# Patient Record
Sex: Male | Born: 1955 | Race: Black or African American | Hispanic: No | Marital: Single | Smoking: Former smoker
Health system: Southern US, Community
[De-identification: ages and names within clinical notes are randomized; demographics above are authoritative.]

## PROBLEM LIST (undated history)

## (undated) DIAGNOSIS — K7460 Unspecified cirrhosis of liver: Secondary | ICD-10-CM

## (undated) DIAGNOSIS — G8929 Other chronic pain: Secondary | ICD-10-CM

## (undated) DIAGNOSIS — B1920 Unspecified viral hepatitis C without hepatic coma: Secondary | ICD-10-CM

## (undated) DIAGNOSIS — Z973 Presence of spectacles and contact lenses: Secondary | ICD-10-CM

## (undated) DIAGNOSIS — E785 Hyperlipidemia, unspecified: Secondary | ICD-10-CM

## (undated) DIAGNOSIS — I2510 Atherosclerotic heart disease of native coronary artery without angina pectoris: Secondary | ICD-10-CM

## (undated) DIAGNOSIS — F1910 Other psychoactive substance abuse, uncomplicated: Secondary | ICD-10-CM

## (undated) DIAGNOSIS — I10 Essential (primary) hypertension: Secondary | ICD-10-CM

## (undated) DIAGNOSIS — Z789 Other specified health status: Secondary | ICD-10-CM

## (undated) DIAGNOSIS — I252 Old myocardial infarction: Secondary | ICD-10-CM

## (undated) DIAGNOSIS — Z720 Tobacco use: Secondary | ICD-10-CM

## (undated) DIAGNOSIS — Z87891 Personal history of nicotine dependence: Secondary | ICD-10-CM

## (undated) DIAGNOSIS — Z9861 Coronary angioplasty status: Principal | ICD-10-CM

## (undated) HISTORY — DX: Coronary angioplasty status: Z98.61

## (undated) HISTORY — DX: Personal history of nicotine dependence: Z87.891

## (undated) HISTORY — DX: Other chronic pain: G89.29

## (undated) HISTORY — DX: Atherosclerotic heart disease of native coronary artery without angina pectoris: I25.10

## (undated) HISTORY — DX: Old myocardial infarction: I25.2

## (undated) HISTORY — DX: Other specified health status: Z78.9

## (undated) HISTORY — DX: Unspecified cirrhosis of liver: K74.60

## (undated) HISTORY — PX: COLONOSCOPY: SHX174

## (undated) HISTORY — DX: Presence of spectacles and contact lenses: Z97.3

---

## 2013-06-05 DIAGNOSIS — I252 Old myocardial infarction: Secondary | ICD-10-CM

## 2013-06-05 DIAGNOSIS — I2510 Atherosclerotic heart disease of native coronary artery without angina pectoris: Secondary | ICD-10-CM

## 2013-06-05 HISTORY — DX: Atherosclerotic heart disease of native coronary artery without angina pectoris: I25.10

## 2013-06-05 HISTORY — DX: Old myocardial infarction: I25.2

## 2013-06-05 HISTORY — PX: CORONARY ANGIOPLASTY WITH STENT PLACEMENT: SHX49

## 2013-06-05 HISTORY — PX: TRANSTHORACIC ECHOCARDIOGRAM: SHX275

## 2013-06-22 ENCOUNTER — Emergency Department (HOSPITAL_COMMUNITY): Payer: No Typology Code available for payment source

## 2013-06-22 ENCOUNTER — Emergency Department (INDEPENDENT_AMBULATORY_CARE_PROVIDER_SITE_OTHER)
Admission: EM | Admit: 2013-06-22 | Discharge: 2013-06-22 | Disposition: A | Discharge status: Other Acute Inpatient Hospital | Payer: No Typology Code available for payment source | Source: Home / Self Care | Attending: Family Medicine | Admitting: Family Medicine

## 2013-06-22 ENCOUNTER — Encounter (HOSPITAL_COMMUNITY): Payer: Self-pay | Admitting: Emergency Medicine

## 2013-06-22 ENCOUNTER — Inpatient Hospital Stay (HOSPITAL_COMMUNITY)
Admission: EM | Admit: 2013-06-22 | Discharge: 2013-06-25 | DRG: 247 | Disposition: A | Discharge status: Home / Self Care | Payer: No Typology Code available for payment source | Attending: Cardiology | Admitting: Cardiology

## 2013-06-22 DIAGNOSIS — I10 Essential (primary) hypertension: Secondary | ICD-10-CM

## 2013-06-22 DIAGNOSIS — E785 Hyperlipidemia, unspecified: Secondary | ICD-10-CM | POA: Diagnosis present

## 2013-06-22 DIAGNOSIS — I219 Acute myocardial infarction, unspecified: Secondary | ICD-10-CM

## 2013-06-22 DIAGNOSIS — I2510 Atherosclerotic heart disease of native coronary artery without angina pectoris: Secondary | ICD-10-CM

## 2013-06-22 DIAGNOSIS — B1920 Unspecified viral hepatitis C without hepatic coma: Secondary | ICD-10-CM

## 2013-06-22 DIAGNOSIS — F1210 Cannabis abuse, uncomplicated: Secondary | ICD-10-CM | POA: Diagnosis present

## 2013-06-22 DIAGNOSIS — F1910 Other psychoactive substance abuse, uncomplicated: Secondary | ICD-10-CM

## 2013-06-22 DIAGNOSIS — R079 Chest pain, unspecified: Secondary | ICD-10-CM

## 2013-06-22 DIAGNOSIS — R945 Abnormal results of liver function studies: Secondary | ICD-10-CM

## 2013-06-22 DIAGNOSIS — I252 Old myocardial infarction: Secondary | ICD-10-CM

## 2013-06-22 DIAGNOSIS — Z720 Tobacco use: Secondary | ICD-10-CM

## 2013-06-22 DIAGNOSIS — E876 Hypokalemia: Secondary | ICD-10-CM

## 2013-06-22 DIAGNOSIS — D649 Anemia, unspecified: Secondary | ICD-10-CM

## 2013-06-22 DIAGNOSIS — F17200 Nicotine dependence, unspecified, uncomplicated: Secondary | ICD-10-CM | POA: Diagnosis present

## 2013-06-22 DIAGNOSIS — R7989 Other specified abnormal findings of blood chemistry: Secondary | ICD-10-CM

## 2013-06-22 DIAGNOSIS — F1410 Cocaine abuse, uncomplicated: Secondary | ICD-10-CM | POA: Diagnosis present

## 2013-06-22 DIAGNOSIS — I214 Non-ST elevation (NSTEMI) myocardial infarction: Principal | ICD-10-CM | POA: Diagnosis present

## 2013-06-22 DIAGNOSIS — D696 Thrombocytopenia, unspecified: Secondary | ICD-10-CM | POA: Diagnosis present

## 2013-06-22 DIAGNOSIS — Z7982 Long term (current) use of aspirin: Secondary | ICD-10-CM

## 2013-06-22 DIAGNOSIS — E871 Hypo-osmolality and hyponatremia: Secondary | ICD-10-CM

## 2013-06-22 DIAGNOSIS — Z955 Presence of coronary angioplasty implant and graft: Secondary | ICD-10-CM

## 2013-06-22 DIAGNOSIS — Z9861 Coronary angioplasty status: Secondary | ICD-10-CM

## 2013-06-22 DIAGNOSIS — E1169 Type 2 diabetes mellitus with other specified complication: Secondary | ICD-10-CM

## 2013-06-22 HISTORY — DX: Essential (primary) hypertension: I10

## 2013-06-22 HISTORY — DX: Other psychoactive substance abuse, uncomplicated: F19.10

## 2013-06-22 HISTORY — DX: Hyperlipidemia, unspecified: E78.5

## 2013-06-22 HISTORY — DX: Unspecified viral hepatitis C without hepatic coma: B19.20

## 2013-06-22 HISTORY — DX: Tobacco use: Z72.0

## 2013-06-22 LAB — CBC
HEMATOCRIT: 43.6 % (ref 39.0–52.0)
Hemoglobin: 14.8 g/dL (ref 13.0–17.0)
MCH: 30 pg (ref 26.0–34.0)
MCHC: 33.9 g/dL (ref 30.0–36.0)
MCV: 88.4 fL (ref 78.0–100.0)
Platelets: 148 10*3/uL — ABNORMAL LOW (ref 150–400)
RBC: 4.93 MIL/uL (ref 4.22–5.81)
RDW: 13.8 % (ref 11.5–15.5)
WBC: 7.1 10*3/uL (ref 4.0–10.5)

## 2013-06-22 LAB — POCT I-STAT TROPONIN I: Troponin i, poc: >50 ng/mL (ref 0.00–0.08)

## 2013-06-22 LAB — BASIC METABOLIC PANEL
BUN: 11 mg/dL (ref 6–23)
CO2: 27 mEq/L (ref 19–32)
CREATININE: 1.06 mg/dL (ref 0.50–1.35)
Calcium: 9.4 mg/dL (ref 8.4–10.5)
Chloride: 101 mEq/L (ref 96–112)
GFR calc Af Amer: 88 mL/min — ABNORMAL LOW (ref >90)
GFR, EST NON AFRICAN AMERICAN: 76 mL/min — AB (ref >90)
Glucose, Bld: 100 mg/dL — ABNORMAL HIGH (ref 70–99)
Potassium: 4 mEq/L (ref 3.7–5.3)
Sodium: 140 mEq/L (ref 137–147)

## 2013-06-22 LAB — PRO B NATRIURETIC PEPTIDE: Pro B Natriuretic peptide (BNP): 563.1 pg/mL — ABNORMAL HIGH (ref 0–125)

## 2013-06-22 MED ORDER — ATORVASTATIN CALCIUM 80 MG PO TABS
80.0000 mg | ORAL_TABLET | Freq: Every day | ORAL | Status: DC
  Administered 2013-06-23 – 2013-06-24 (×3): 80 mg via ORAL
  Filled 2013-06-22 (×5): qty 1

## 2013-06-22 MED ORDER — NITROGLYCERIN 0.4 MG SL SUBL: 0.4000 mg | SUBLINGUAL_TABLET | SUBLINGUAL | Status: DC | PRN

## 2013-06-22 MED ORDER — HYDRALAZINE HCL 20 MG/ML IJ SOLN
10.0000 mg | Freq: Once | INTRAMUSCULAR | Status: DC
  Filled 2013-06-22: qty 0.5

## 2013-06-22 MED ORDER — NITROGLYCERIN IN D5W 200-5 MCG/ML-% IV SOLN
2.0000 ug/min | INTRAVENOUS | Status: DC
  Administered 2013-06-23: 10 ug/min via INTRAVENOUS
  Filled 2013-06-22: qty 250

## 2013-06-22 MED ORDER — HEPARIN BOLUS VIA INFUSION
4000.0000 [IU] | Freq: Once | INTRAVENOUS | Status: AC
  Administered 2013-06-22: 4000 [IU] via INTRAVENOUS
  Filled 2013-06-22: qty 4000

## 2013-06-22 MED ORDER — HEPARIN (PORCINE) IN NACL 100-0.45 UNIT/ML-% IJ SOLN
1300.0000 [IU]/h | INTRAMUSCULAR | Status: DC
  Administered 2013-06-22: 1100 [IU]/h via INTRAVENOUS
  Filled 2013-06-22 (×3): qty 250

## 2013-06-22 MED ORDER — METOPROLOL TARTRATE 25 MG PO TABS
25.0000 mg | ORAL_TABLET | Freq: Two times a day (BID) | ORAL | Status: DC
  Administered 2013-06-22: 25 mg via ORAL
  Filled 2013-06-22 (×3): qty 1

## 2013-06-22 MED ORDER — SODIUM CHLORIDE 0.9 % IV SOLN
INTRAVENOUS | Status: AC
  Administered 2013-06-22: via INTRAVENOUS

## 2013-06-22 MED ORDER — ASPIRIN EC 81 MG PO TBEC
81.0000 mg | DELAYED_RELEASE_TABLET | Freq: Every day | ORAL | Status: DC
  Administered 2013-06-24 – 2013-06-25 (×2): 81 mg via ORAL
  Filled 2013-06-22 (×3): qty 1

## 2013-06-22 MED ORDER — PNEUMOCOCCAL VAC POLYVALENT 25 MCG/0.5ML IJ INJ
0.5000 mL | INJECTION | INTRAMUSCULAR | Status: AC
  Administered 2013-06-24: 07:00:00 0.5 mL via INTRAMUSCULAR
  Filled 2013-06-22: qty 0.5

## 2013-06-22 MED ORDER — ASPIRIN 81 MG PO CHEW
324.0000 mg | CHEWABLE_TABLET | Freq: Once | ORAL | Status: AC
  Administered 2013-06-22: 324 mg via ORAL
  Filled 2013-06-22: qty 4

## 2013-06-22 MED ORDER — INFLUENZA VAC SPLIT QUAD 0.5 ML IM SUSP
0.5000 mL | INTRAMUSCULAR | Status: AC
  Administered 2013-06-24: 07:00:00 0.5 mL via INTRAMUSCULAR
  Filled 2013-06-22: qty 0.5

## 2013-06-22 MED ORDER — NITROGLYCERIN 0.4 MG SL SUBL
0.4000 mg | SUBLINGUAL_TABLET | SUBLINGUAL | Status: DC | PRN
  Administered 2013-06-22 (×2): 0.4 mg via SUBLINGUAL
  Filled 2013-06-22: qty 25

## 2013-06-22 NOTE — Progress Notes (Signed)
ANTICOAGULATION CONSULT NOTE - Initial Consult  Pharmacy Consult for heparin Indication: chest pain/ACS  No Known Allergies  Patient Measurements: Height: 5\' 7"  (170.2 cm) Weight: 194 lb (87.998 kg) IBW/kg (Calculated) : 66.1 Heparin Dosing Weight: 84.2kg  Vital Signs: Temp: 98.5 F (36.9 C) (02/18 1913) Temp src: Oral (02/18 1913) BP: 177/125 mmHg (02/18 2015) Pulse Rate: 78 (02/18 2015)  Labs:  Recent Labs  06/22/13 1921  HGB 14.8  HCT 43.6  PLT 148*  CREATININE 1.06    Estimated Creatinine Clearance: 80.5 ml/min (by C-G formula based on Cr of 1.06).   Medical History: Past Medical History  Diagnosis Date  . Hypertension     Medications:  Infusions:  . heparin    . heparin      Assessment: 51 yom presented to the ED with CP. To start IV heparin for anticoagulation. Baseline H/H is WNL but plts are slightly low. Troponin is >50. He is not on any anticoagulation PTA.   Goal of Therapy:  Heparin level 0.3-0.7 units/ml Monitor platelets by anticoagulation protocol: Yes   Plan:  1. Heparin bolus 4000 units IV x 1 2. Heparin gtt 1100 units/hr 3. Check a 6 hour heparin level 4. Daily heparin level and CBC  Jay Fuller, Rande Lawman 06/22/2013,8:50 PM

## 2013-06-22 NOTE — ED Provider Notes (Signed)
CSN: [id number]     Arrival date & time 06/22/13  1910 History   First MD Initiated Contact with Patient 06/22/13 1950     Chief Complaint  Patient presents with  . Chest Pain     (Consider location/radiation/quality/duration/timing/severity/associated sxs/prior Treatment) Patient is a 58 y.o. male presenting with chest pain.  Chest Pain Pain location:  R chest and L chest Pain quality: burning   Radiates to: across chest. Pain severity:  Moderate Onset quality:  Gradual Duration:  4 hours Timing:  Constant Progression:  Resolved Chronicity:  New Context comment:  While watching tv and smoking cigarette Relieved by:  Nothing Worsened by:  Nothing tried Associated symptoms: diaphoresis, dizziness, nausea, palpitations (throughout day today) and shortness of breath   Associated symptoms: no abdominal pain, no cough, no fever and not vomiting     Past Medical History  Diagnosis Date  . Hypertension    History reviewed. No pertinent past surgical history. History reviewed. No pertinent family history. History  Substance Use Topics  . Smoking status: Current Every Day Smoker  . Smokeless tobacco: Never Used  . Alcohol Use: Yes    Review of Systems  Constitutional: Positive for diaphoresis. Negative for fever.  HENT: Negative for congestion.   Respiratory: Positive for shortness of breath. Negative for cough.   Cardiovascular: Positive for chest pain and palpitations (throughout day today).  Gastrointestinal: Positive for nausea. Negative for vomiting, abdominal pain and diarrhea.  Neurological: Positive for dizziness.  All other systems reviewed and are negative.      Allergies  Review of patient's allergies indicates no known allergies.  Home Medications   Current Outpatient Rx  Name  Route  Sig  Dispense  Refill  . aspirin 81 MG chewable tablet   Oral   Chew 81 mg by mouth daily.          BP 183/110  Pulse 66  Temp(Src) 98.5 F (36.9 C) (Oral)   Resp 16  Ht 5\' 7"  (1.702 m)  Wt 194 lb (87.998 kg)  BMI 30.38 kg/m2  SpO2 100% Physical Exam  Nursing note and vitals reviewed. Constitutional: He is oriented to person, place, and time. He appears well-developed and well-nourished. No distress.  HENT:  Head: Normocephalic and atraumatic.  Mouth/Throat: Oropharynx is clear and moist.  Eyes: Conjunctivae are normal. Pupils are equal, round, and reactive to light. No scleral icterus.  Neck: Neck supple.  Cardiovascular: Normal rate, regular rhythm, normal heart sounds and intact distal pulses.   No murmur heard. Pulmonary/Chest: Effort normal and breath sounds normal. No stridor. No respiratory distress. He has no wheezes. He has no rales.  Abdominal: Soft. He exhibits no distension. There is no tenderness.  Musculoskeletal: Normal range of motion. He exhibits no edema.  Neurological: He is alert and oriented to person, place, and time.  Skin: Skin is warm and dry. No rash noted.  Psychiatric: He has a normal mood and affect. His behavior is normal.    ED Course  Procedures (including critical care time) Labs Review Labs Reviewed  CBC - Abnormal; Notable for the following:    Platelets 148 (*)    All other components within normal limits  BASIC METABOLIC PANEL - Abnormal; Notable for the following:    Glucose, Bld 100 (*)    GFR calc non Af Amer 76 (*)    GFR calc Af Amer 88 (*)    All other components within normal limits  PRO B NATRIURETIC PEPTIDE - Abnormal; Notable  for the following:    Pro B Natriuretic peptide (BNP) 563.1 (*)    All other components within normal limits  POCT I-STAT TROPONIN I - Abnormal; Notable for the following:    Troponin i, poc >50.00 (*)    All other components within normal limits   Imaging Review Dg Chest 2 View  06/22/2013   CLINICAL DATA:  Chest pain.  EXAM: CHEST  2 VIEW  COMPARISON:  None.  FINDINGS: Heart size is upper normal. Lungs are clear. No pneumothorax or pleural effusion.   IMPRESSION: No acute disease.   Electronically Signed   By: Inge Rise M.D.   On: 06/22/2013 19:49  All radiology studies independently viewed by me.     EKG Interpretation    Date/Time:  Wednesday June 22 2013 19:16:23 EST Ventricular Rate:  63 PR Interval:  140 QRS Duration: 78 QT Interval:  446 QTC Calculation: 456 R Axis:   -40 Text Interpretation:  Normal sinus rhythm Left axis deviation Septal infarct , age undetermined Abnormal ECG No significant change was found Confirmed by [facility]  MD, TREY ([id number]) on 06/22/2013 7:50:52 PM            MDM   Final diagnoses:  MI (myocardial infarction)    58 yo male with chest pain that started at about 10:00 last night and lasted for 4 hours. Associated with shortness of breath, diaphoresis, nausea, lightheadedness. No chest pain since that time. He went to urgent care today, because he wanted to get refills on his blood pressure medications. Then sent to ED for further evaluation. He has not had chest pain today, but has had palpitations. I-STAT troponin is undetectable he high.I suspect he had an MI last night. Will give aspirin, heparin and consult cardiology.    Houston Siren III, MD 06/22/13 [phone]

## 2013-06-22 NOTE — ED Notes (Signed)
Pt states Right sided CP that started last night and lasted for 4 hrs with SOB. Sensation was described as warm, with diaphoresis. CP and SOB currently resolved

## 2013-06-22 NOTE — ED Provider Notes (Signed)
CSN: [id number]     Arrival date & time 06/22/13  1646 History   First MD Initiated Contact with Patient 06/22/13 1819     Chief Complaint  Patient presents with  . Chest Pain     (Consider location/radiation/quality/duration/timing/severity/associated sxs/prior Treatment) Patient is a 58 y.o. male presenting with chest pain. The history is provided by the patient.  Chest Pain Pain location:  R chest and L chest Pain quality: burning and hot   Pain radiates to:  Does not radiate Pain radiates to the back: no   Pain severity:  Mild Duration:  12 hours Progression:  Resolved Chronicity:  New Context comment:  Sitting on bed smoking a cig, developed trans thoracic cp and sob, lasted all night resolved this am , has not recurred., felt like heart was racing. Relieved by:  None tried Ineffective treatments:  None tried Associated symptoms: palpitations and shortness of breath   Associated symptoms: no abdominal pain, no back pain, no diaphoresis, no fever, no lower extremity edema, no numbness and not vomiting   Risk factors: hypertension, male sex and smoking   Risk factors comment:  Out of bp med for long time.   Past Medical History  Diagnosis Date  . Hypertension    History reviewed. No pertinent past surgical history. History reviewed. No pertinent family history. History  Substance Use Topics  . Smoking status: Current Every Day Smoker  . Smokeless tobacco: Not on file  . Alcohol Use: Yes    Review of Systems  Constitutional: Negative for fever and diaphoresis.  Respiratory: Positive for shortness of breath.   Cardiovascular: Positive for chest pain and palpitations. Negative for leg swelling.  Gastrointestinal: Negative.  Negative for vomiting and abdominal pain.  Musculoskeletal: Negative for back pain.  Neurological: Negative for numbness.      Allergies  Review of patient's allergies indicates no known allergies.  Home Medications   Current Outpatient Rx   Name  Route  Sig  Dispense  Refill  . benazepril (LOTENSIN) 10 MG tablet   Oral   Take 10 mg by mouth daily.          BP 178/87  Pulse 71  Temp(Src) 98 F (36.7 C) (Oral)  Resp 14  SpO2 100% Physical Exam  Nursing note and vitals reviewed. Constitutional: He is oriented to person, place, and time. He appears well-developed and well-nourished. No distress.  Neck: Normal range of motion. Neck supple.  Cardiovascular: Normal rate, regular rhythm, normal heart sounds and intact distal pulses.   Pulmonary/Chest: Effort normal and breath sounds normal.  Abdominal: Soft. Bowel sounds are normal.  Lymphadenopathy:    He has no cervical adenopathy.  Neurological: He is alert and oriented to person, place, and time.  Skin: Skin is warm and dry.    ED Course  Procedures (including critical care time) Labs Review Labs Reviewed - No data to display Imaging Review No results found.    MDM   Final diagnoses:  Chest pain with moderate risk of acute coronary syndrome   Sent for eval of new onset CP last eve, lasting all night with sob and palpitations. Smoker, out of bp med for long time, abnl ecg. No cp at this time.     Billy Fischer, MD 06/22/13 [phone]

## 2013-06-22 NOTE — ED Notes (Signed)
MD at bedside. 

## 2013-06-22 NOTE — H&P (Signed)
Cardiology History and Physical  No primary provider on file.  History of Present Illness (and review of medical records): Jay Fuller is a 59 y.o. male who presents for evaluation of chest pain.  He has no hx of MI or known CAD.  He doesn't currently have a PCP as he recently moved from [facility] approx 4 months ago.  He has also been off his HTN meds since that time.  He reports hx of Hepatitis C and tobacco abuse.  He developed severe burning chest pain around 10pm night prior.  Pain radiated from right side across to left, throat and jaw.  Pain was associated with palpitations, shortness of breath and dizziness along with feeling hot.  Pain lasted 4hrs and was rated 6/10.  He went to urgent care today for evaluation and was sent to ED for further assessment.  Initial POC troponin was undetectable.  Cardiology was called for admission.  He is now chest pain free.   Previous diagnostic testing for coronary artery disease includes: exercise treadmill test. Previous history of cardiac disease includes None. Coronary artery disease risk factors include: hypertension, male gender and smoking/ tobacco exposure. Patient denies history of angina, coronary artery disease, ischemic heart disease, previous M.I. and valvular disease.  Review of Systems Pertinent items are noted in HPI. Further review of systems was otherwise negative other than stated in HPI.  Patient Active Problem List   Diagnosis Date Noted  . MI (myocardial infarction) 06/22/2013   Past Medical History  Diagnosis Date  . Hypertension   . Hepatitis C     Past Surgical History  Procedure Laterality Date  . No past surgeries      Prescriptions prior to admission  Medication Sig Dispense Refill  . aspirin 81 MG chewable tablet Chew 81 mg by mouth daily.       No Known Allergies  History  Substance Use Topics  . Smoking status: Current Every Day Smoker -- 0.50 packs/day for 25 years  . Smokeless tobacco: Never Used  .  Alcohol Use: 1.2 oz/week    2 Cans of beer per week     Comment: twice weekly    History reviewed. No pertinent family history.   Objective: Filed Vitals:   06/23/13 0245 06/23/13 0300 06/23/13 0330 06/23/13 0400  BP: 154/71 151/67 167/91 157/73  Pulse:   70   Temp:   97.8 F (36.6 C)   TempSrc:   Oral   Resp:   20   Height:      Weight:      SpO2:   100%    General appearance: alert, cooperative, appears stated age and no distress Head: Normocephalic, without obvious abnormality, atraumatic Eyes: conjunctivae/corneas clear. PERRL, EOM's intact. Fundi benign. Neck: no carotid bruit, no JVD and supple, symmetrical, trachea midline Lungs: clear to auscultation bilaterally Chest wall: no tenderness Heart: regular rate and rhythm, S1, S2 normal, no murmur, click, rub or gallop Abdomen: soft, non-tender; bowel sounds normal; no masses,  no organomegaly Extremities: extremities normal, atraumatic, no cyanosis or edema Pulses: 2+ and symmetric Neurologic: Grossly normal  Results for orders placed during the hospital encounter of 06/22/13 (from the past 48 hour(s))  CBC     Status: Abnormal   Collection Time    06/22/13  7:21 PM      Result Value Ref Range   WBC 7.1  4.0 - 10.5 K/uL   RBC 4.93  4.22 - 5.81 MIL/uL   Hemoglobin 14.8  13.0 - 17.0  g/dL   HCT 43.6  39.0 - 52.0 %   MCV 88.4  78.0 - 100.0 fL   MCH 30.0  26.0 - 34.0 pg   MCHC 33.9  30.0 - 36.0 g/dL   RDW 13.8  11.5 - 15.5 %   Platelets 148 (*) 150 - 400 K/uL  BASIC METABOLIC PANEL     Status: Abnormal   Collection Time    06/22/13  7:21 PM      Result Value Ref Range   Sodium 140  137 - 147 mEq/L   Potassium 4.0  3.7 - 5.3 mEq/L   Chloride 101  96 - 112 mEq/L   CO2 27  19 - 32 mEq/L   Glucose, Bld 100 (*) 70 - 99 mg/dL   BUN 11  6 - 23 mg/dL   Creatinine, Ser 1.06  0.50 - 1.35 mg/dL   Calcium 9.4  8.4 - 10.5 mg/dL   GFR calc non Af Amer 76 (*) >90 mL/min   GFR calc Af Amer 88 (*) >90 mL/min   Comment:  (NOTE)     The eGFR has been calculated using the CKD EPI equation.     This calculation has not been validated in all clinical situations.     eGFR's persistently <90 mL/min signify possible Chronic Kidney     Disease.  PRO B NATRIURETIC PEPTIDE     Status: Abnormal   Collection Time    06/22/13  7:21 PM      Result Value Ref Range   Pro B Natriuretic peptide (BNP) 563.1 (*) 0 - 125 pg/mL  POCT I-STAT TROPONIN I     Status: Abnormal   Collection Time    06/22/13  7:33 PM      Result Value Ref Range   Troponin i, poc >50.00 (*) 0.00 - 0.08 ng/mL   Comment NOTIFIED PHYSICIAN     Comment 3       Comment: Due to the release kinetics of cTnI, a negative result within the first hours of the onset of symptoms does not rule out myocardial infarction with certainty. If myocardial infarction is still suspected, repeat the test at appropriate intervals.   POC Troponin I result given to Dr. Doy Mince    MRSA PCR SCREENING     Status: None   Collection Time    06/22/13 11:34 PM      Result Value Ref Range   MRSA by PCR NEGATIVE  NEGATIVE   Comment:            The GeneXpert MRSA Assay (FDA     approved for NASAL specimens     only), is one component of a     comprehensive MRSA colonization     surveillance program. It is not     intended to diagnose MRSA     infection nor to guide or     monitor treatment for     MRSA infections.  TROPONIN I     Status: Abnormal   Collection Time    06/23/13 12:07 AM      Result Value Ref Range   Troponin I >20.00 (*) <0.30 ng/mL   Comment:            Due to the release kinetics of cTnI,     a negative result within the first hours     of the onset of symptoms does not rule out     myocardial infarction with certainty.     If myocardial  infarction is still suspected,     repeat the test at appropriate intervals.     CRITICAL RESULT CALLED TO, READ BACK BY AND VERIFIED WITH:     HINSHAW T,RN 06/23/13 0147 WAYK  HEPARIN LEVEL (UNFRACTIONATED)      Status: Abnormal   Collection Time    06/23/13  3:20 AM      Result Value Ref Range   Heparin Unfractionated 0.21 (*) 0.30 - 0.70 IU/mL   Comment:            IF HEPARIN RESULTS ARE BELOW     EXPECTED VALUES, AND PATIENT     DOSAGE HAS BEEN CONFIRMED,     SUGGEST FOLLOW UP TESTING     OF ANTITHROMBIN III LEVELS.  CBC     Status: Abnormal   Collection Time    06/23/13  3:20 AM      Result Value Ref Range   WBC 7.3  4.0 - 10.5 K/uL   RBC 4.70  4.22 - 5.81 MIL/uL   Hemoglobin 14.1  13.0 - 17.0 g/dL   HCT 41.2  39.0 - 52.0 %   MCV 87.7  78.0 - 100.0 fL   MCH 30.0  26.0 - 34.0 pg   MCHC 34.2  30.0 - 36.0 g/dL   RDW 13.5  11.5 - 15.5 %   Platelets 130 (*) 150 - 400 K/uL  MAGNESIUM     Status: None   Collection Time    06/23/13  3:20 AM      Result Value Ref Range   Magnesium 2.0  1.5 - 2.5 mg/dL  BASIC METABOLIC PANEL     Status: Abnormal   Collection Time    06/23/13  3:20 AM      Result Value Ref Range   Sodium 141  137 - 147 mEq/L   Potassium 3.8  3.7 - 5.3 mEq/L   Chloride 102  96 - 112 mEq/L   CO2 26  19 - 32 mEq/L   Glucose, Bld 110 (*) 70 - 99 mg/dL   BUN 10  6 - 23 mg/dL   Creatinine, Ser 1.06  0.50 - 1.35 mg/dL   Calcium 8.9  8.4 - 10.5 mg/dL   GFR calc non Af Amer 76 (*) >90 mL/min   GFR calc Af Amer 88 (*) >90 mL/min   Comment: (NOTE)     The eGFR has been calculated using the CKD EPI equation.     This calculation has not been validated in all clinical situations.     eGFR's persistently <90 mL/min signify possible Chronic Kidney     Disease.  PROTIME-INR     Status: None   Collection Time    06/23/13  3:20 AM      Result Value Ref Range   Prothrombin Time 13.6  11.6 - 15.2 seconds   INR 1.06  0.00 - 1.49   Dg Chest 2 View  06/22/2013   CLINICAL DATA:  Chest pain.  EXAM: CHEST  2 VIEW  COMPARISON:  None.  FINDINGS: Heart size is upper normal. Lungs are clear. No pneumothorax or pleural effusion.  IMPRESSION: No acute disease.   Electronically Signed   By:  Inge Rise M.D.   On: 06/22/2013 19:49   ECG:  Sinus rhythm HR 63, LAD, septal infarct, age undetermined, no prior to compare  Assessment: 65M hx of HTN-untreated, Hepatitis C, Tobacco abuse presents after 4hrs of chest pain >12hrs ago with elevated troponins. Myocardial infarction, late presenting HTN-uncontrolled,  off treatment for ~67month Hepatitis C Tobacco abuse  Plan: 1. Cardiology Admission  2. Continuous monitoring on Telemetry. 3. Repeat ekg on admit, prn chest pain or arrythmia 4. Trend cardiac biomarkers, check lipids, hgba1c, tsh 5. Medical management to include ASA, Heparin gtt, BB, Statin 6. NTG for BP control 7. TTE in am assess LV function, wall motion abnormality. 8. Will keep NPO for ischemic evaluation.

## 2013-06-22 NOTE — ED Notes (Signed)
Patient transported to X-ray 

## 2013-06-22 NOTE — ED Notes (Signed)
Pt given Nitroglycerin sublingual per Dr. Doy Mince order to decrease BP. Pt denies CP.

## 2013-06-22 NOTE — ED Notes (Signed)
C/o pain in chest last night and heart racing. Denies pain at present. Out of his BP medication for 1 month or more

## 2013-06-23 ENCOUNTER — Encounter (HOSPITAL_COMMUNITY): Payer: Self-pay | Admitting: *Deleted

## 2013-06-23 ENCOUNTER — Encounter (HOSPITAL_COMMUNITY)
Admission: EM | Disposition: A | Discharge status: Home / Self Care | Payer: Self-pay | Source: Home / Self Care | Attending: Cardiology

## 2013-06-23 DIAGNOSIS — I10 Essential (primary) hypertension: Secondary | ICD-10-CM

## 2013-06-23 DIAGNOSIS — I214 Non-ST elevation (NSTEMI) myocardial infarction: Principal | ICD-10-CM

## 2013-06-23 DIAGNOSIS — I2510 Atherosclerotic heart disease of native coronary artery without angina pectoris: Secondary | ICD-10-CM

## 2013-06-23 DIAGNOSIS — Z720 Tobacco use: Secondary | ICD-10-CM

## 2013-06-23 HISTORY — PX: LEFT HEART CATHETERIZATION WITH CORONARY ANGIOGRAM: SHX5451

## 2013-06-23 LAB — BASIC METABOLIC PANEL
BUN: 10 mg/dL (ref 6–23)
CALCIUM: 8.9 mg/dL (ref 8.4–10.5)
CO2: 26 mEq/L (ref 19–32)
Chloride: 102 mEq/L (ref 96–112)
Creatinine, Ser: 1.06 mg/dL (ref 0.50–1.35)
GFR calc Af Amer: 88 mL/min — ABNORMAL LOW (ref >90)
GFR, EST NON AFRICAN AMERICAN: 76 mL/min — AB (ref >90)
GLUCOSE: 110 mg/dL — AB (ref 70–99)
Potassium: 3.8 mEq/L (ref 3.7–5.3)
SODIUM: 141 meq/L (ref 137–147)

## 2013-06-23 LAB — POCT ACTIVATED CLOTTING TIME
Activated Clotting Time: 415 seconds
Activated Clotting Time: 559 seconds

## 2013-06-23 LAB — CBC
HCT: 41.2 % (ref 39.0–52.0)
Hemoglobin: 14.1 g/dL (ref 13.0–17.0)
MCH: 30 pg (ref 26.0–34.0)
MCHC: 34.2 g/dL (ref 30.0–36.0)
MCV: 87.7 fL (ref 78.0–100.0)
PLATELETS: 130 10*3/uL — AB (ref 150–400)
RBC: 4.7 MIL/uL (ref 4.22–5.81)
RDW: 13.5 % (ref 11.5–15.5)
WBC: 7.3 10*3/uL (ref 4.0–10.5)

## 2013-06-23 LAB — MRSA PCR SCREENING: MRSA BY PCR: NEGATIVE

## 2013-06-23 LAB — RAPID URINE DRUG SCREEN, HOSP PERFORMED
Amphetamines: NOT DETECTED
BARBITURATES: NOT DETECTED
Benzodiazepines: NOT DETECTED
COCAINE: POSITIVE — AB
Opiates: NOT DETECTED
Tetrahydrocannabinol: POSITIVE — AB

## 2013-06-23 LAB — MAGNESIUM: MAGNESIUM: 2 mg/dL (ref 1.5–2.5)

## 2013-06-23 LAB — PROTIME-INR
INR: 1.06 (ref 0.00–1.49)
PROTHROMBIN TIME: 13.6 s (ref 11.6–15.2)

## 2013-06-23 LAB — TROPONIN I

## 2013-06-23 LAB — HEPARIN LEVEL (UNFRACTIONATED): Heparin Unfractionated: 0.21 IU/mL — ABNORMAL LOW (ref 0.30–0.70)

## 2013-06-23 LAB — LIPID PANEL
Cholesterol: 168 mg/dL (ref 0–200)
HDL: 27 mg/dL — AB (ref >39)
LDL Cholesterol: 108 mg/dL — ABNORMAL HIGH (ref 0–99)
Total CHOL/HDL Ratio: 6.2 RATIO
Triglycerides: 167 mg/dL — ABNORMAL HIGH (ref <150)
VLDL: 33 mg/dL (ref 0–40)

## 2013-06-23 LAB — HEMOGLOBIN A1C
Hgb A1c MFr Bld: 5.7 % — ABNORMAL HIGH (ref <5.7)
Mean Plasma Glucose: 117 mg/dL — ABNORMAL HIGH (ref <117)

## 2013-06-23 LAB — PLATELET COUNT: PLATELETS: 125 10*3/uL — AB (ref 150–400)

## 2013-06-23 LAB — TSH: TSH: 1.84 u[IU]/mL (ref 0.350–4.500)

## 2013-06-23 SURGERY — LEFT HEART CATHETERIZATION WITH CORONARY ANGIOGRAM
Anesthesia: LOCAL

## 2013-06-23 MED ORDER — VERAPAMIL HCL 2.5 MG/ML IV SOLN
INTRAVENOUS | Status: AC
Start: 2013-06-23 — End: 2013-06-23
  Filled 2013-06-23: qty 2

## 2013-06-23 MED ORDER — FENTANYL CITRATE 0.05 MG/ML IJ SOLN
INTRAMUSCULAR | Status: AC
  Filled 2013-06-23: qty 2

## 2013-06-23 MED ORDER — SODIUM CHLORIDE 0.9 % IV SOLN
250.0000 mL | INTRAVENOUS | Status: DC | PRN
  Administered 2013-06-23: 250 mL via INTRAVENOUS

## 2013-06-23 MED ORDER — TIROFIBAN HCL IV 5 MG/100ML
0.1500 ug/kg/min | INTRAVENOUS | Status: AC
  Filled 2013-06-23: qty 100

## 2013-06-23 MED ORDER — CARVEDILOL 6.25 MG PO TABS
6.2500 mg | ORAL_TABLET | Freq: Once | ORAL | Status: AC
  Administered 2013-06-23: 6.25 mg via ORAL
  Filled 2013-06-23: qty 1

## 2013-06-23 MED ORDER — SODIUM CHLORIDE 0.9 % IJ SOLN: 3.0000 mL | INTRAMUSCULAR | Status: DC | PRN

## 2013-06-23 MED ORDER — HEART ATTACK BOUNCING BOOK
Freq: Once | Status: AC
  Administered 2013-06-23: 05:00:00
  Filled 2013-06-23: qty 1

## 2013-06-23 MED ORDER — SODIUM CHLORIDE 0.9 % IV SOLN: INTRAVENOUS | Status: DC

## 2013-06-23 MED ORDER — HEPARIN SODIUM (PORCINE) 1000 UNIT/ML IJ SOLN
INTRAMUSCULAR | Status: AC
  Filled 2013-06-23: qty 1

## 2013-06-23 MED ORDER — MIDAZOLAM HCL 2 MG/2ML IJ SOLN
INTRAMUSCULAR | Status: AC
  Filled 2013-06-23: qty 2

## 2013-06-23 MED ORDER — TIROFIBAN HCL IV 12.5 MG/250 ML
INTRAVENOUS | Status: AC
  Filled 2013-06-23: qty 250

## 2013-06-23 MED ORDER — TICAGRELOR 90 MG PO TABS
90.0000 mg | ORAL_TABLET | Freq: Two times a day (BID) | ORAL | Status: DC
  Administered 2013-06-23 – 2013-06-24 (×3): 90 mg via ORAL
  Filled 2013-06-23 (×5): qty 1

## 2013-06-23 MED ORDER — HEPARIN (PORCINE) IN NACL 2-0.9 UNIT/ML-% IJ SOLN
INTRAMUSCULAR | Status: AC
  Filled 2013-06-23: qty 1000

## 2013-06-23 MED ORDER — NITROGLYCERIN IN D5W 200-5 MCG/ML-% IV SOLN
2.0000 ug/min | INTRAVENOUS | Status: DC
  Administered 2013-06-23: 15 ug/min via INTRAVENOUS
  Administered 2013-06-24 (×2): 5 ug/min via INTRAVENOUS

## 2013-06-23 MED ORDER — LISINOPRIL 5 MG PO TABS
5.0000 mg | ORAL_TABLET | Freq: Every day | ORAL | Status: DC
  Administered 2013-06-23 – 2013-06-24 (×2): 5 mg via ORAL
  Filled 2013-06-23 (×2): qty 1

## 2013-06-23 MED ORDER — MORPHINE SULFATE 2 MG/ML IJ SOLN: 2.0000 mg | INTRAMUSCULAR | Status: DC | PRN

## 2013-06-23 MED ORDER — SODIUM CHLORIDE 0.9 % IV SOLN: 1.0000 mL/kg/h | INTRAVENOUS | Status: AC

## 2013-06-23 MED ORDER — SODIUM CHLORIDE 0.9 % IJ SOLN: 3.0000 mL | Freq: Two times a day (BID) | INTRAMUSCULAR | Status: DC

## 2013-06-23 MED ORDER — CARVEDILOL 6.25 MG PO TABS
6.2500 mg | ORAL_TABLET | Freq: Two times a day (BID) | ORAL | Status: DC
  Administered 2013-06-23 – 2013-06-24 (×3): 6.25 mg via ORAL
  Filled 2013-06-23 (×3): qty 1
  Filled 2013-06-23: qty 2
  Filled 2013-06-23 (×2): qty 1

## 2013-06-23 MED ORDER — SODIUM CHLORIDE 0.9 % IV SOLN
250.0000 mL | INTRAVENOUS | Status: DC | PRN
Start: 2013-06-23 — End: 2013-06-23

## 2013-06-23 MED ORDER — TIROFIBAN HCL IV 5 MG/100ML
INTRAVENOUS | Status: AC
  Filled 2013-06-23: qty 100

## 2013-06-23 MED ORDER — ASPIRIN 81 MG PO CHEW
81.0000 mg | CHEWABLE_TABLET | ORAL | Status: AC
  Administered 2013-06-23: 10:00:00 81 mg via ORAL
  Filled 2013-06-23: qty 1

## 2013-06-23 MED ORDER — ACETAMINOPHEN 325 MG PO TABS
325.0000 mg | ORAL_TABLET | Freq: Once | ORAL | Status: AC
  Administered 2013-06-23: 325 mg via ORAL
  Filled 2013-06-23: qty 1

## 2013-06-23 MED ORDER — LIDOCAINE HCL (PF) 1 % IJ SOLN
INTRAMUSCULAR | Status: AC
Start: 2013-06-23 — End: 2013-06-23
  Filled 2013-06-23: qty 30

## 2013-06-23 MED ORDER — NITROGLYCERIN 0.2 MG/ML ON CALL CATH LAB
INTRAVENOUS | Status: AC
  Filled 2013-06-23: qty 1

## 2013-06-23 NOTE — Progress Notes (Signed)
Subjective: Currently CP free. Denies SOB.   Objective: Vital signs in last 24 hours: Temp:  [97.8 F (36.6 C)-98.5 F (36.9 C)] 97.8 F (36.6 C) (02/19 0330) Pulse Rate:  [61-81] 70 (02/19 0330) Resp:  [14-22] 20 (02/19 0330) BP: (132-242)/(67-131) 141/80 mmHg (02/19 0600) SpO2:  [97 %-100 %] 100 % (02/19 0330) Weight:  [186 lb 8.2 oz (84.6 kg)-194 lb (87.998 kg)] 186 lb 8.2 oz (84.6 kg) (02/18 2343) Last BM Date: 06/22/13  Intake/Output from previous day: 02/18 0701 - 02/19 0700 In: 262.2 [P.O.:100; I.V.:162.2] Out: -  Intake/Output this shift: Total I/O In: 262.2 [P.O.:100; I.V.:162.2] Out: -   Medications Current Facility-Administered Medications  Medication Dose Route Frequency Provider Last Rate Last Dose  . aspirin EC tablet 81 mg  81 mg Oral Daily Alwyn Pea, MD      . atorvastatin (LIPITOR) tablet 80 mg  80 mg Oral q1800 Alwyn Pea, MD   80 mg at 06/23/13 0007  . heparin ADULT infusion 100 units/mL (25000 units/250 mL)  1,300 Units/hr Intravenous Continuous Rogue Bussing, RPH 13 mL/hr at 06/23/13 0439 1,300 Units/hr at 06/23/13 0439  . hydrALAZINE (APRESOLINE) injection 10 mg  10 mg Intravenous Once Alwyn Pea, MD      . influenza vac split quadrivalent PF (FLUARIX) injection 0.5 mL  0.5 mL Intramuscular Tomorrow-1000 Sueanne Margarita, MD      . metoprolol tartrate (LOPRESSOR) tablet 25 mg  25 mg Oral BID Alwyn Pea, MD   25 mg at 06/22/13 2343  . nitroGLYCERIN (NITROSTAT) SL tablet 0.4 mg  0.4 mg Sublingual Q5 Min x 3 PRN Alwyn Pea, MD      . nitroGLYCERIN 0.2 mg/mL in dextrose 5 % infusion  2-200 mcg/min Intravenous Titrated Alwyn Pea, MD 9 mL/hr at 06/23/13 0209 30 mcg/min at 06/23/13 0209  . pneumococcal 23 valent vaccine (PNU-IMMUNE) injection 0.5 mL  0.5 mL Intramuscular Tomorrow-1000 Sueanne Margarita, MD        PE: General appearance: alert, cooperative and no distress Lungs: clear to auscultation  bilaterally Heart: regular rate and rhythm Extremities: no LEE Pulses: 2+ and symmetric Skin: warm and dry Neurologic: Grossly normal  Lab Results:   Recent Labs  06/22/13 1921 06/23/13 0320  WBC 7.1 7.3  HGB 14.8 14.1  HCT 43.6 41.2  PLT 148* 130*   BMET  Recent Labs  06/22/13 1921 06/23/13 0320  NA 140 141  K 4.0 3.8  CL 101 102  CO2 27 26  GLUCOSE 100* 110*  BUN 11 10  CREATININE 1.06 1.06  CALCIUM 9.4 8.9   PT/INR  Recent Labs  06/23/13 0320  LABPROT 13.6  INR 1.06   Cardiac Panel (last 3 results)  Recent Labs  06/23/13 0007  TROPONINI >20.00*    Assessment/Plan  Active Problems:   MI (myocardial infarction)- NSTEMI   HTN (hypertension)   Tobacco abuse  Plan: NSTEMI. Troponin > 20.00 This is also in the setting of severe HTN. He is currently CP free on IV heparin and IV NTG. BP is better controlled this am. Most resent BP is 141/80. Overnight, BP reached 242/125. This was confirmed with RN to be his true BP. IV NTG was increased and patient was given Lopressor. Will keep NPO and plan for LHC today. Renal function is stable. INR is WNL. Resume IV heparin, IV NTG, ASA, high dose statin and BB. Lipid panel and Hgb A1c pending. MD to follow.     LOS: 1 day  Brittainy M. Ladoris Gene 06/23/2013 6:55 AM

## 2013-06-23 NOTE — Plan of Care (Signed)
Problem: Consults Goal: Tobacco Cessation referral if indicated Outcome: Completed/Met Date Met:  06/23/13 Discussed smoking cessation with pt, states he has cut down recently and using "Blu" cigarette occasionally.  Discussed dangers of electric cigarettes, tips for success, nicotine gum and patch.  Smoking cessation handout given with support hotline number and website.  Also discussed importance of BP control, med compliance, and f/u with MD. Pt states does not have primary MD in this area.  Advised him to follow up with MD given to him upon discharge.  Pt voiced understanding.

## 2013-06-23 NOTE — Progress Notes (Signed)
Arrived from ED per stretcher, pain free.  BP 217/120, started on NTG gtt, awaiting hydralazine from pharmacy.  SR 70's.  POC reviewed w/ pt and wife over phone with pt's permission.  Questions answered.  Call light in reach.  NPO.

## 2013-06-23 NOTE — Progress Notes (Signed)
ANTICOAGULATION CONSULT NOTE - Follow Up Consult  Pharmacy Consult for heparin Indication: ACS  Labs:  Recent Labs  06/22/13 1921 06/23/13 0007 06/23/13 0320  HGB 14.8  --   --   HCT 43.6  --   --   PLT 148*  --   --   LABPROT  --   --  13.6  INR  --   --  1.06  HEPARINUNFRC  --   --  0.21*  CREATININE 1.06  --  1.06  TROPONINI  --  >20.00*  --     Assessment: 58yo male subtherapeutic on heparin with initial dosing for MI.  Goal of Therapy:  Heparin level 0.3-0.7 units/ml   Plan:  Will increase heparin gtt by 2 units/kg/hr to 1300 units/hr and check level in 6hr.  Wynona Neat, PharmD, BCPS  06/23/2013,4:36 AM

## 2013-06-23 NOTE — Progress Notes (Signed)
Notified Dr. Ellyn Hack that drug screen was positive for cocaine verified the need to give metoprolol he stopped and started coreg.

## 2013-06-23 NOTE — Progress Notes (Addendum)
I have seen and evaluated the patient this AM along with Ellen Henri, PA. I agree with her findings, examination as well as impression recommendations.  58 y/o man with CRFs of: Male, >50, tobacco & porrly controlled HTN p/w ACS/NSTEMI - prolonged SSCP, now CP free.  Troponin > 20 would suggest large distribution of injury, ECG non-diagnostic, ? LCx distribution.   Exam is benign, BP improved - will need to be on a good regimen prior to d/c - anticipate not d/c tomorrow.  Convert from Metoprolol to Carvedilol with + Troponin levels.  Plan: LHC +/- PCI this AM.    The procedure with Risks/Benefits/Alternatives and Indications was reviewed with the patient.  All questions were answered.    Risks / Complications include, but not limited to: Death, MI, CVA/TIA, VF/VT (with defibrillation), Bradycardia (need for temporary pacer placement), contrast induced nephropathy, bleeding / bruising / hematoma / pseudoaneurysm, vascular or coronary injury (with possible emergent CT or Vascular Surgery), adverse medication reactions, infection.    The patient voices understanding and agree to proceed.     Leonie Man, M.D., M.S. Interventional Cardiologist  [place] Pager # [phone] 06/23/2013

## 2013-06-23 NOTE — CV Procedure (Signed)
CARDIAC CATHETERIZATION AND PERCUTANEOUS CORONARY INTERVENTION REPORT  NAME:  Jay Fuller   MRN: [id number] DOB:  12-08-1955   ADMIT DATE: 06/22/2013 Procedure Date: 06/23/2013  INTERVENTIONAL CARDIOLOGIST: Leonie Man, M.D., MS PRIMARY CARE PROVIDER: No primary provider on file. PRIMARY CARDIOLOGIST: [facility]  PATIENT:  Jay Fuller is a 58 y.o. male with a history of poorly controlled hypertension, hepatitis C, tobacco abuse and urine toxicology positive for cocaine and marijuana. He recently moved up from [place] about 4 months ago, and does not have a PCP. He presented to the emergency room last night (06/23/2011) for evaluation of severe chest pain/burning that began roughly 10 PM the preceding night. The episode lasted roughly 4 hours her rate is 6/10. He initially went to the urgent care for evaluation and was sent directly to emergent further assessment. His initial troponin levels were negative, however followup levels are greater than 20. EKG was not suggestive of any significant ischemic abnormalities. He was chest pain-free upon arrival to [facility] Emergency Room. It is also noted to be profoundly hypertensive upon arrival, this is been relatively well controlled with oral medications and IV nitroglycerin. I saw evaluated the patient this morning, and discussed the plan for Left Heart Catheterization possible PCI today. He now presents for this procedure. Appropriateness Of Use criteria were met as APPROPRIATE for cardiac catheterization and PCI if indicated.  PRE-OPERATIVE DIAGNOSIS:    Non-ST Elevation MI  PROCEDURES PERFORMED:    Left Heart Catheterization with Coronary Angiography   PROCEDURE:Consent:  Risks of procedure as well as the alternatives and risks of each were explained to the (patient/caregiver).  Consent for procedure obtained. Consent for signed by MD and patient with RN witness -- placed on chart.   PROCEDURE: The patient was  brought to the [facility] in the fasting state and prepped and draped in the usual sterile fashion for Right groin or radial access. A modified Allen's test with plethysmography was performed, revealing excellent Ulnar artery collateral flow.  Sterile technique was used including antiseptics, cap, gloves, gown, hand hygiene, mask and sheet.  Skin prep: Chlorhexidine.  Time Out: Verified patient identification, verified procedure, site/side was marked, verified correct patient position, special equipment/implants available, medications/allergies/relevent history reviewed, required imaging and test results available.  Performed  Access: Right Radial Artery; 6 Fr Sheath --  Seldinger technique (Angiocath Micropuncture Kit)  IA Radial Cocktail, IV Heparin Diagnostic:  5 Fr TIG 4.0, Angled Pigtail catheters advanced and exchanged over Lon exchange safety J.  Left and Right Coronary Artery Angiography: TIG 4.0  LV Hemodynamics (LV Gram):  Angled pigtail  Hemodynamics:  Central Aortic / Mean Pressures: 120/67  mmHg; 89 mmHg  Left Ventricular Pressures / EDP: 127/6 mmHg; 12 mmHg  Left Ventriculography:  EF: Roughly 55 %  Wall Motion: Essentially normal  Coronary Anatomy:  Left Main: Large-caliber vessel that bifurcates into the LAD, and Circumflex. Angiographically normal. LAD: Large-caliber vessel that is quite tortuous in its course as it reaches to the apex. It tapers distally. The mid vessel has 2 areas of roughly 20-40% stenoses. There are several small diagonal branches one of which has a 95% stenosis.  Left Circumflex: Large-caliber vessel which gives rise to proximal OM1 before going into the AV groove where it gives off another OM 2, there is a small atrial branch followed by a tubular 20% lesion. Just after a small marginal branch there is a tapering into a 99% lesion at the bifurcation of the  distal AV groove Circumflex and LPL 1  OM1: Moderate  large-caliber vessel that courses as an obtuse marginal and bifurcates in the mid vessel. There are tandem 80 and 90% lesion (Lesion #2) in the proximal segment prior to bifurcation.  OM 2: Small-caliber vessel with minimal luminal irregularities  LPL 1: Small to moderate caliber vessel that courses along the inferolateral/posterolateral wall to the apex. Diffuse mild luminal irregularities   RCA: Moderate large-caliber dominant vessel it takes a Shepherd's crook bend proximally. There mild diffuse luminal irregularities with 2 major RV marginal branches. The vessel is quite tortuous distally it bifurcates into the Right Posterior Descending Artery (RPDA), and a Right Posterior AV Groove Branch (RPAV)  RPDA: Small-caliber vessel with 2 small branches. Minimal luminal irregularities  RPL Sysytem:The RPAV begin a moderate caliber vessel bifurcates into a small distal posterolateral branch as it terminates as a moderate caliber bifurcating RPL branch  After reviewing the initial angiography, the culprit lesion was thought to be the distal circumflex proximal lesion just prior to take off of LPL 1, however the OM1 tandem lesions also appear to be relatively significant.  Preparation were made to proceed with PCI on this both Lesions #1 and #2.  PCI Medications:  Aggrastat bolus was administered with plan for the drip to run for 2 hours post PCI  Additional 2500 units IV heparin was administered for an ACT> 300 Sec  Brilinta 180 mg by mouth Percutaneous Coronary Intervention:  Guide: 6 Fr   XB 3.5 Guidewire: Pro-water  Lesion #1: Distal Circumflex 99% reduced to 0%; TIMI 2 flow pre-, TIMI-3 flow post  Predilation Balloon: Mini Trek 2.0 mm x 12 mm;   8 Atm x 30 Sec,  Stent: Promus Premier DES 2.25 mm x 16 mm;   14 Atm x 30 Sec Post-dilation Balloon: [place] Euphora 2.5 mm x 20 mm;   15 Atm x 45 Sec - distal, 18 Atm x 45 Sec - proximal  Final Diameter: 2.85m distal, 2.55 mm proximal  Lesion  #2: Proximal OM1; 80% and 90% tandem lesions reduced to 0%; TIMI 3 flow pre-and post  Predilation Balloon: Mini Trek 2.0 mm x 12 mm   8 Atm x 30 Sec x 2 inflations distal and proximal  Stent: Promus Premier DES 2.5 mm x 20 mm;   Deployed at: 14 Atm x 30 Sec  Post-dilation with Stent Balloon: 18 Atm x 60 Sec,  Final Diameter: 2.75 mm   Post deployment angiography in multiple views, with and without guidewire in place revealed excellent stent deployment and lesion coverage.  There was no evidence of dissection or perforation. The catheter wire was removed completely out of body over wire.  TR Band:  1210 Hours, 13 mL air  MEDICATIONS:  Anesthesia:  Local Lidocaine 2 ml  Sedation:  2 mg IV Versed, 50 mcg IV fentanyl ;   Omnipaque Contrast: 180 ml  Anticoagulation:  IV Heparin 5000+2500 Units ; total 7500 Units   Anti-Platelet Agent:  Aggrastat bolus followed by infusion - to continue 2 hours post PCI; Brilinta 180 mg  PATIENT DISPOSITION:    The patient was transferred to the PACU holding area in a hemodynamicaly stable, chest pain free condition.  The patient tolerated the procedure well, and there were no complications.  EBL:   < 10 ml  The patient was stable before, during, and after the procedure.  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  Dual Antiplatelet Therapy for a minimum of 1 year, can switch to Plavix after the first month if necessary with P2Y12 inhibition assay checked  Aggressive blood pressure control.  Beta Blocker was converted to carvedilol today, I have added lisinopril 5 mg daily.  Would anticipate at least one more day tomorrow for medication adjustment at discharge on Saturday.   Leonie Man, M.D., M.S. [facility] [address]. [place], [place]  [address]  [phone]  06/23/2013 10:41 AM

## 2013-06-23 NOTE — Care Management Note (Addendum)
    Page 1 of 2   06/24/2013     4:13:00 PM   CARE MANAGEMENT NOTE 06/24/2013  Patient:  LYNX, GOODRICH   Account Number:  [id number]  Date Initiated:  06/23/2013  Documentation initiated by:  [facility]  Subjective/Objective Assessment:   89M hx of HTN-untreated, Hepatitis C, Tobacco abuse presents after 4hrs of chest pain >12hrs ago with elevated troponins.  Myocardial infarction, late presenting  HTN-uncontrolled, off treatment for  17month  Hepatitis.//Home with children     Action/Plan:   Procedure(s):  LEFT HEART CATHETERIZATION WITH CORONARY ANGIOGRAM (N/A) +/- PCI as a surgical intervention//Benefits check if going hm on new med   Anticipated DC Date:  06/23/2013   Anticipated DC Plan:  [place] CM consult      Choice offered to / List presented to:             Status of service:  In process, will continue to follow Medicare Important Message given?   (If response is "NO", the following Medicare IM given date fields will be blank) Date Medicare IM given:   Date Additional Medicare IM given:    Discharge Disposition:    Per UR Regulation:    If discussed at Long Length of Stay Meetings, dates discussed:    Comments:  06/24/13 1[place] RN, BSN, NCM [phone] to pt at beside regarding benefits check.  Encouraged pt to fill out [place] application for prescription assistance with Brilinta.  Application left on shadow chart for MD/PA to complete their part.  06/24/13 1[place] RN, BSN, [phone] Per rep at [facility]; Brilinta: aJosem Kaufmannis required ph [phone]   Patient has a $5000 deductible that has $0 applied/ for 30 day at retail co-pay until deductible met is $282.93/ 90 day mail order co-pay until deductible met is $756.07   06/24/11 1[place]ena RN, BSN, N[phone] with pt at bedside regarding benefits check for Brilinta.  Pt has brochure with 30 day free card and  refill assistance card intact.  Pt utilizes [employer]on [employer]for prescription needs.  NCM called pharmacy to confirm availability of medication. Information relayed to pt.  Pt verbalizes importance of filling medication upon discharge.  06/23/13 0900 Rogene Meth, RN, BSN, [phone] pt answered that he had trouble obtaining meds.  With pt having insurance benefits, MATCH progaram is not available.  Suggested pt  utilize [employer] or [employer] list.

## 2013-06-23 NOTE — Interval H&P Note (Signed)
History and Physical Interval Note:  06/23/2013 7:56 AM  Jay Fuller  has presented today for surgery, with the diagnosis of NSTEMI The various methods of treatment have been discussed with the patient and family. After consideration of risks, benefits and other options for treatment, the patient has consented to  Procedure(s): LEFT HEART CATHETERIZATION WITH CORONARY ANGIOGRAM (N/A) +/- PCI as a surgical intervention .  The patient's history has been reviewed, patient examined, no change in status, stable for surgery.  I have reviewed the patient's chart and labs.  Questions were answered to the patient's satisfaction.     HARDING,DAVID W  Cath Lab Visit (complete for each Cath Lab visit)  Clinical Evaluation Leading to the Procedure:   ACS: yes  Non-ACS:    Anginal Classification: CCS IV  Anti-ischemic medical therapy: Minimal Therapy (1 class of medications)  Non-Invasive Test Results: No non-invasive testing performed  Prior CABG: No previous CABG

## 2013-06-23 NOTE — Progress Notes (Signed)
Hydralazine on national backorder, pharmacy advises it has none to send.  BP down to 154/71 with Metoprolol PO and NTG gtt titrated up to 30 mcg/min.  Dr Colon Flattery informed.  Pt dozing quietly.

## 2013-06-23 NOTE — Progress Notes (Signed)
TR BAND REMOVAL  LOCATION:  right radial  DEFLATED PER PROTOCOL:  no  TIME BAND OFF / DRESSING APPLIED:   1800   SITE UPON ARRIVAL:   Level 0  SITE AFTER BAND REMOVAL:  Level 0  REVERSE ALLEN'S TEST:    positive  CIRCULATION SENSATION AND MOVEMENT:  Within Normal Limits  yes  COMMENTS:  Delay in normal time off due to Aggrastat running 2 hours post cath

## 2013-06-24 ENCOUNTER — Encounter (HOSPITAL_COMMUNITY): Payer: Self-pay | Admitting: Nurse Practitioner

## 2013-06-24 ENCOUNTER — Telehealth: Payer: Self-pay | Admitting: Cardiology

## 2013-06-24 DIAGNOSIS — Z9861 Coronary angioplasty status: Secondary | ICD-10-CM

## 2013-06-24 DIAGNOSIS — F1910 Other psychoactive substance abuse, uncomplicated: Secondary | ICD-10-CM

## 2013-06-24 DIAGNOSIS — E1169 Type 2 diabetes mellitus with other specified complication: Secondary | ICD-10-CM

## 2013-06-24 DIAGNOSIS — F17200 Nicotine dependence, unspecified, uncomplicated: Secondary | ICD-10-CM

## 2013-06-24 DIAGNOSIS — E876 Hypokalemia: Secondary | ICD-10-CM

## 2013-06-24 DIAGNOSIS — I10 Essential (primary) hypertension: Secondary | ICD-10-CM

## 2013-06-24 DIAGNOSIS — I219 Acute myocardial infarction, unspecified: Secondary | ICD-10-CM

## 2013-06-24 DIAGNOSIS — I2510 Atherosclerotic heart disease of native coronary artery without angina pectoris: Secondary | ICD-10-CM

## 2013-06-24 DIAGNOSIS — I517 Cardiomegaly: Secondary | ICD-10-CM

## 2013-06-24 DIAGNOSIS — E785 Hyperlipidemia, unspecified: Secondary | ICD-10-CM

## 2013-06-24 LAB — BASIC METABOLIC PANEL
BUN: 12 mg/dL (ref 6–23)
CALCIUM: 8.4 mg/dL (ref 8.4–10.5)
CO2: 23 mEq/L (ref 19–32)
CREATININE: 1.18 mg/dL (ref 0.50–1.35)
Chloride: 105 mEq/L (ref 96–112)
GFR, EST AFRICAN AMERICAN: 77 mL/min — AB (ref >90)
GFR, EST NON AFRICAN AMERICAN: 66 mL/min — AB (ref >90)
Glucose, Bld: 140 mg/dL — ABNORMAL HIGH (ref 70–99)
POTASSIUM: 3.4 meq/L — AB (ref 3.7–5.3)
Sodium: 140 mEq/L (ref 137–147)

## 2013-06-24 LAB — CBC
HEMATOCRIT: 36.8 % — AB (ref 39.0–52.0)
Hemoglobin: 12.4 g/dL — ABNORMAL LOW (ref 13.0–17.0)
MCH: 29.5 pg (ref 26.0–34.0)
MCHC: 33.7 g/dL (ref 30.0–36.0)
MCV: 87.6 fL (ref 78.0–100.0)
Platelets: 122 10*3/uL — ABNORMAL LOW (ref 150–400)
RBC: 4.2 MIL/uL — ABNORMAL LOW (ref 4.22–5.81)
RDW: 13.5 % (ref 11.5–15.5)
WBC: 5.9 10*3/uL (ref 4.0–10.5)

## 2013-06-24 MED ORDER — CARVEDILOL 12.5 MG PO TABS
12.5000 mg | ORAL_TABLET | Freq: Two times a day (BID) | ORAL | Status: DC
  Administered 2013-06-24 (×2): 12.5 mg via ORAL
  Filled 2013-06-24 (×4): qty 1

## 2013-06-24 MED ORDER — POTASSIUM CHLORIDE CRYS ER 20 MEQ PO TBCR
40.0000 meq | EXTENDED_RELEASE_TABLET | Freq: Once | ORAL | Status: AC
  Administered 2013-06-24: 10:00:00 40 meq via ORAL
  Filled 2013-06-24: qty 2

## 2013-06-24 MED ORDER — HYDRALAZINE HCL 20 MG/ML IJ SOLN
10.0000 mg | Freq: Four times a day (QID) | INTRAMUSCULAR | Status: DC | PRN
  Administered 2013-06-24: 23:00:00 10 mg via INTRAVENOUS

## 2013-06-24 MED ORDER — LISINOPRIL 10 MG PO TABS
10.0000 mg | ORAL_TABLET | Freq: Every day | ORAL | Status: DC
  Administered 2013-06-25 (×2): 10 mg via ORAL
  Filled 2013-06-24 (×2): qty 1

## 2013-06-24 MED ORDER — LISINOPRIL 5 MG PO TABS
5.0000 mg | ORAL_TABLET | Freq: Once | ORAL | Status: AC
  Administered 2013-06-24: 5 mg via ORAL
  Filled 2013-06-24: qty 1

## 2013-06-24 MED ORDER — LABETALOL HCL 5 MG/ML IV SOLN: 10.0000 mg | Freq: Four times a day (QID) | INTRAVENOUS | Status: DC | PRN

## 2013-06-24 NOTE — Progress Notes (Signed)
I have seen and evaluated the patient this AM along with Ignacia Bayley, NP. I agree with his findings, examination as well as impression recommendations.  Echo pending. Stable post PCI to Cx & OM yesterday.  BP improved - agree with titration of Coreg to 12.5 bid today; ACE-I added yesterday. Statin added. Counseled re: need for avoiding Polysubstance abuse.  Anticipate d/c by tomorrow if stable -- mostly due to significant Troponin elevation & need for improved BP control.  Leonie Man, M.D., M.S. Interventional Cardiologist  [place] Pager # [phone] 06/24/2013

## 2013-06-24 NOTE — Progress Notes (Signed)
  Echocardiogram 2D Echocardiogram has been performed.  Jay Fuller, Jay Fuller 06/24/2013, 11:46 AM

## 2013-06-24 NOTE — Progress Notes (Signed)
CARDIAC REHAB PHASE I   PRE:  Rate/Rhythm: 68 SR  BP:  Supine: 178/87  Sitting:   Standing:    SaO2: 99 RA  MODE:  Ambulation: 1000 ft   POST:  Rate/Rhythm: 86 SR  BP:  Supine:   Sitting: 183/92  Standing:    SaO2:  0810-0935 Pt tolerated ambulation well without c/o of cp or SOB. BP elevated before and after walk. Completed MI and stent education with pt. He voices understanding. We discussed smoking cessation. Pt states that he wants to quit and feels that he will be able to. I gave him tips for quitting, coaching contact number  and quit smart class information. We also discussed Outpt. CRP,he agrees to referral to [facility].  Rodney Langton RN 06/24/2013 9:40 AM

## 2013-06-24 NOTE — Telephone Encounter (Signed)
New message    7 day tcm with chris b  appt on 3/2 @ 10:00 .

## 2013-06-24 NOTE — Progress Notes (Signed)
Patient Name: Jay Fuller Date of Encounter: 06/24/2013   Principal Problem:   MI (myocardial infarction)- NSTEMI Active Problems:   CAD (coronary artery disease)   Polysubstance abuse   HTN (hypertension)   Tobacco abuse   Hypokalemia   Hyperlipidemia   SUBJECTIVE  No chest pain or sob overnight.  No wrist pain/swelling.    CURRENT MEDS . aspirin EC  81 mg Oral Daily  . atorvastatin  80 mg Oral q1800  . carvedilol  6.25 mg Oral BID WC  . hydrALAZINE  10 mg Intravenous Once  . lisinopril  5 mg Oral Daily  . sodium chloride  3 mL Intravenous Q12H  . Ticagrelor  90 mg Oral BID    OBJECTIVE  Filed Vitals:   06/23/13 2134 06/23/13 2325 06/24/13 0236 06/24/13 0450  BP: 157/85 158/77 143/59 168/79  Pulse: 76 82 73 75  Temp:  99.8 F (37.7 C)  98.8 F (37.1 C)  TempSrc:  Oral  Oral  Resp:  20  20  Height:      Weight:      SpO2:  97% 96% 98%    Intake/Output Summary (Last 24 hours) at 06/24/13 0732 Last data filed at 06/23/13 2356  Gross per 24 hour  Intake  914.1 ml  Output    451 ml  Net  463.1 ml   Filed Weights   06/22/13 1913 06/22/13 2343  Weight: 194 lb (87.998 kg) 186 lb 8.2 oz (84.6 kg)    PHYSICAL EXAM  General: Pleasant, NAD. Neuro: Alert and oriented X 3. Moves all extremities spontaneously. Psych: Normal affect. HEENT:  Normal  Neck: Supple without bruits or JVD. Lungs:  Resp regular and unlabored, CTA. Heart: RRR no s3, s4, or murmurs. Abdomen: Soft, non-tender, non-distended, BS + x 4.  Extremities: No clubbing, cyanosis or edema. DP/PT/Radials 2+ and equal bilaterally.  r wrist cath site w/o bleeding/bruit/hematoma.  Accessory Clinical Findings  CBC  Recent Labs  06/23/13 0320 06/23/13 2202 06/24/13 0232  WBC 7.3  --  5.9  HGB 14.1  --  12.4*  HCT 41.2  --  36.8*  MCV 87.7  --  87.6  PLT 130* 125* 810*   Basic Metabolic Panel  Recent Labs  06/22/13 1921 06/23/13 0320 06/24/13 0232  NA 140 141 140  K 4.0 3.8  3.4*  CL 101 102 105  CO2 27 26 23   GLUCOSE 100* 110* 140*  BUN 11 10 12   CREATININE 1.06 1.06 1.18  CALCIUM 9.4 8.9 8.4  MG  --  2.0  --    Cardiac Enzymes  Recent Labs  06/23/13 0007 06/23/13 0605  TROPONINI >20.00* >20.00*   Hemoglobin A1C  Recent Labs  06/23/13 0320  HGBA1C 5.7*   Fasting Lipid Panel  Recent Labs  06/23/13 0500  CHOL 168  HDL 27*  LDLCALC 108*  TRIG 167*  CHOLHDL 6.2   Thyroid Function Tests  Recent Labs  06/23/13 0320  TSH 1.840   TELE  Rsr, pac's. Brief run of PAT.  ECG  Rsr, 76, sm inf q's, lvh.  Radiology/Studies  Dg Chest 2 View  06/22/2013   CLINICAL DATA:  Chest pain.  EXAM: CHEST  2 VIEW  COMPARISON:  None.  FINDINGS: Heart size is upper normal. Lungs are clear. No pneumothorax or pleural effusion.  IMPRESSION: No acute disease.   Electronically Signed   By: Inge Rise M.D.   On: 06/22/2013 19:49   ASSESSMENT AND PLAN  1.  NSTEMI/CAD:  In setting of marked HTN and cocaine use.  S/P cath yesterday revealing severe LCX and OM dzs-->PCI/DES to both locations.  No chest pain or sob overnight. R wrist looks good.  Cont asa, brilinta, statin, bb, acei.  We discussed the danger of cocaine usage and beta blocker therapy.  He says that he is done with cocaine.  Cardiac rehab to see today.  Echo today.  Ambulate.  Prob d/c tomorrow.  2.  HTN:  Improving.  Will titrate coreg to 12.5 bid.  Cont acei.  Follow.  3.  HL:  LDL 108.  Cont high potency statin.  Check LFT's.  4.  Polysubstance Abuse:  Tobacco/cocaine/marijuana-->complete cessation advised. Will ask social work to see.  He says that he is done with cocaine.  5.  Hypokalemia:  supp.  Signed, Murray Hodgkins NP

## 2013-06-24 NOTE — Progress Notes (Signed)
Platelet count 125, Rt radial level 0, called to Dr Colon Flattery, no orders received.

## 2013-06-25 ENCOUNTER — Encounter (HOSPITAL_COMMUNITY): Payer: Self-pay | Admitting: Physician Assistant

## 2013-06-25 DIAGNOSIS — I214 Non-ST elevation (NSTEMI) myocardial infarction: Secondary | ICD-10-CM

## 2013-06-25 DIAGNOSIS — R945 Abnormal results of liver function studies: Secondary | ICD-10-CM

## 2013-06-25 DIAGNOSIS — I252 Old myocardial infarction: Secondary | ICD-10-CM

## 2013-06-25 DIAGNOSIS — E871 Hypo-osmolality and hyponatremia: Secondary | ICD-10-CM

## 2013-06-25 DIAGNOSIS — B1920 Unspecified viral hepatitis C without hepatic coma: Secondary | ICD-10-CM

## 2013-06-25 DIAGNOSIS — R7989 Other specified abnormal findings of blood chemistry: Secondary | ICD-10-CM

## 2013-06-25 DIAGNOSIS — D649 Anemia, unspecified: Secondary | ICD-10-CM

## 2013-06-25 LAB — COMPREHENSIVE METABOLIC PANEL
ALT: 82 U/L — ABNORMAL HIGH (ref 0–53)
AST: 133 U/L — ABNORMAL HIGH (ref 0–37)
Albumin: 3 g/dL — ABNORMAL LOW (ref 3.5–5.2)
Alkaline Phosphatase: 128 U/L — ABNORMAL HIGH (ref 39–117)
BILIRUBIN TOTAL: 0.4 mg/dL (ref 0.3–1.2)
BUN: 10 mg/dL (ref 6–23)
CHLORIDE: 101 meq/L (ref 96–112)
CO2: 21 meq/L (ref 19–32)
CREATININE: 1.1 mg/dL (ref 0.50–1.35)
Calcium: 9.1 mg/dL (ref 8.4–10.5)
GFR calc Af Amer: 84 mL/min — ABNORMAL LOW (ref >90)
GFR, EST NON AFRICAN AMERICAN: 72 mL/min — AB (ref >90)
GLUCOSE: 132 mg/dL — AB (ref 70–99)
Potassium: 3.7 mEq/L (ref 3.7–5.3)
Sodium: 136 mEq/L — ABNORMAL LOW (ref 137–147)
Total Protein: 6.8 g/dL (ref 6.0–8.3)

## 2013-06-25 MED ORDER — LISINOPRIL 10 MG PO TABS: 10.0000 mg | ORAL_TABLET | Freq: Every day | ORAL | Status: DC

## 2013-06-25 MED ORDER — NITROGLYCERIN 0.4 MG SL SUBL: 0.4000 mg | SUBLINGUAL_TABLET | SUBLINGUAL | Status: DC | PRN

## 2013-06-25 MED ORDER — CARVEDILOL 25 MG PO TABS: 25.0000 mg | ORAL_TABLET | Freq: Two times a day (BID) | ORAL | Status: DC

## 2013-06-25 MED ORDER — TICAGRELOR 90 MG PO TABS: 90.0000 mg | ORAL_TABLET | Freq: Two times a day (BID) | ORAL | Status: DC

## 2013-06-25 NOTE — Discharge Instructions (Signed)
**  PLEASE REMEMBER TO BRING ALL OF YOUR MEDICATIONS TO EACH OF YOUR FOLLOW-UP OFFICE VISITS.  NO HEAVY LIFTING X 2 WEEKS. NO SEXUAL ACTIVITY X 2 WEEKS. NO DRIVING X 1 WEEK. NO SOAKING BATHS, HOT TUBS, POOLS, ETC., X 7 DAYS.  PLEASE STOP COCAINE ABUSE.   Radial Site Care Refer to this sheet in the next few weeks. These instructions provide you with information on caring for yourself after your procedure. Your caregiver may also give you more specific instructions. Your treatment has been planned according to current medical practices, but problems sometimes occur. Call your caregiver if you have any problems or questions after your procedure. HOME CARE INSTRUCTIONS  You may shower the day after the procedure.Remove the bandage (dressing) and gently wash the site with plain soap and water.Gently pat the site dry.   Do not apply powder or lotion to the site.   Do not submerge the affected site in water for 3 to 5 days.   Inspect the site at least twice daily.   Do not flex or bend the affected arm for 24 hours.   No lifting over 5 pounds (2.3 kg) for 5 days after your procedure.   What to expect:  Any bruising will usually fade within 1 to 2 weeks.   Blood that collects in the tissue (hematoma) may be painful to the touch. It should usually decrease in size and tenderness within 1 to 2 weeks.  SEEK IMMEDIATE MEDICAL CARE IF:  You have unusual pain at the radial site.   You have redness, warmth, swelling, or pain at the radial site.   You have drainage (other than a small amount of blood on the dressing).   You have chills.   You have a fever or persistent symptoms for more than 72 hours.   You have a fever and your symptoms suddenly get worse.   Your arm becomes pale, cool, tingly, or numb.   You have heavy bleeding from the site. Hold pressure on the site.

## 2013-06-25 NOTE — Progress Notes (Signed)
CARDIAC REHAB PHASE I   PRE:  Rate/Rhythm: 86 SR  BP:  Sitting: 159/94     SaO2: 98 RA  MODE:  Ambulation: 950 ft   POST:  Rate/Rhythm: 82 SR  BP:  Sitting: 148/89    SaO2:   Pt walked 950 ft independently, with no CP and no c/o.  Pt tolerated walk well with steady gait and quick pace.  Pt stated he did not have any questions at this time. [id number]  Lillia Dallas MS, ACSM RCEP 7:42 AM 06/25/2013

## 2013-06-25 NOTE — Discharge Summary (Signed)
Discharge Summary   Patient ID: Jay Fuller,  MRN: [id number], DOB/AGE: 04-Jan-1956 58 y.o.  Admit date: 06/22/2013 Discharge date: 06/25/2013  Primary Physician: No PCP Primary Cardiologist: New- to establish w/ D. Ellyn Hack, MD  Discharge Diagnoses Principal Problem:   NSTEMI (non-ST elevated myocardial infarction)  - In setting of marked HTN and cocaine abuse  - TnI trend > 20.00  - S/p PCI 06/23/13: DES-99% distal LCx at distal AV groove bifurcation & DES-80% and 90% prox OM1 lesions; EF 55%  - DAPT- ASA/Brilinta x 12 months  - Brilinta assistance provided, if affordability an issue, to consider transitioning to Plavix after 1 month Active Problems:   CAD (coronary artery disease)  - Discharged on ASA, Brilinta, ACEi, BB, NTG SL PRN  - Statin held secondary to elevated LFTs   Polysubstance abuse  - UDS- + cocaine, + THC  - Cessation strongly advised, adamant on quitting  - Metoprolol switched to carvedilol for less B1 selectivity   HTN (hypertension)  - Markedly elevated (SBP 220s shortly after admission)  - Discharged on carvedilol 55m BID (up-titrated twice) and lisinopril  - To re-evaluate +/- adjust meds on follow-up    Hyperlipidemia  - Statin held due to elevated LFTs   Elevated LFTs/hepatitis C  - AST 133/ALT 82 this AM  - Needs to establish with PCP/GI  - Recheck CMET on follow-up  - Statin held  - Advised to avoid acetaminophen, EtOH   Tobacco abuse  - Cessation stressed   Hypokalemia  - Resolved   Hyponatremia  - Mild, 134 this AM  - Recheck CMET on follow-up   Normocytic anemia  - Suspect dilutional, post-cath blood loss  Allergies No Known Allergies  Diagnostic Studies/Procedures  PA/LATERAL CHEST X-RAY - 06/22/13  IMPRESSION:  No acute disease.  CARDIAC CATHETERIZATION + PERCUTANEOUS CORONARY INTERVENTION - 06/23/13  Hemodynamics:  Central Aortic / Mean Pressures: 120/67 mmHg; 89 mmHg  Left Ventricular Pressures / EDP: 127/6 mmHg; 12  mmHg Left Ventriculography:  EF: Roughly 55 %  Wall Motion: Essentially normal Coronary Anatomy:  Left Main: Large-caliber vessel that bifurcates into the LAD, and Circumflex. Angiographically normal. LAD: Large-caliber vessel that is quite tortuous in its course as it reaches to the apex. It tapers distally. The mid vessel has 2 areas of roughly 20-40% stenoses. There are several small diagonal branches one of which has a 95% stenosis.  Left Circumflex: Large-caliber vessel which gives rise to proximal OM1 before going into the AV groove where it gives off another OM 2, there is a small atrial branch followed by a tubular 20% lesion. Just after a small marginal branch there is a tapering into a 99% lesion at the bifurcation of the distal AV groove Circumflex and LPL 1  OM1: Moderate large-caliber vessel that courses as an obtuse marginal and bifurcates in the mid vessel. There are tandem 80 and 90% lesion (Lesion #2) in the proximal segment prior to bifurcation.  OM 2: Small-caliber vessel with minimal luminal irregularities  LPL 1: Small to moderate caliber vessel that courses along the inferolateral/posterolateral wall to the apex. Diffuse mild luminal irregularities RCA: Moderate large-caliber dominant vessel it takes a Shepherd's crook bend proximally. There mild diffuse luminal irregularities with 2 major RV marginal branches. The vessel is quite tortuous distally it bifurcates into the Right Posterior Descending Artery (RPDA), and a Right Posterior AV Groove Branch (RPAV)  RPDA: Small-caliber vessel with 2 small branches. Minimal luminal irregularities  RPL Sysytem:The RPAV begin a moderate caliber  vessel bifurcates into a small distal posterolateral branch as it terminates as a moderate caliber bifurcating RPL branch After reviewing the initial angiography, the culprit lesion was thought to be the distal circumflex proximal lesion just prior to take off of LPL 1, however the OM1 tandem lesions  also appear to be relatively significant. Preparation were made to proceed with PCI on this both Lesions #1 and #2.   Percutaneous Coronary Intervention:  Guide: 6 Fr XB 3.5 Guidewire: Pro-water  Lesion #1: Distal Circumflex 99% reduced to 0%; TIMI 2 flow pre-, TIMI-3 flow post  Predilation Balloon: Mini Trek 2.0 mm x 12 mm;  8 Atm x 30 Sec,  Stent: Promus Premier DES 2.25 mm x 16 mm;  14 Atm x 30 Sec Post-dilation Balloon: [place]on Euphora 2.5 mm x 20 mm;  15 Atm x 45 Sec - distal, 18 Atm x 45 Sec - proximal  Final Diameter: 2.70m distal, 2.55 mm proximal Lesion #2: Proximal OM1; 80% and 90% tandem lesions reduced to 0%; TIMI 3 flow pre-and post  Predilation Balloon: Mini Trek 2.0 mm x 12 mm  8 Atm x 30 Sec x 2 inflations distal and proximal  Stent: Promus Premier DES 2.5 mm x 20 mm;  Deployed at: 14 Atm x 30 Sec  Post-dilation with Stent Balloon: 18 Atm x 60 Sec,  Final Diameter: 2.75 mm PATIENT DISPOSITION:  The patient was transferred to the PACU holding area in a hemodynamicaly stable, chest pain free condition.  The patient tolerated the procedure well, and there were no complications. EBL: < 10 ml  The patient was stable before, during, and after the procedure. POST-OPERATIVE DIAGNOSIS:  Severe circumflex CAD involving OM1 and distal circumflex-LPL 1, status post successful PCI of both lesions with a Promus DES stents  Well-preserved Ejection Fraction with high normal EDP   TRANSTHORACIC ECHOCARDIOGRAM - 06/24/13  - Left ventricle: The cavity size was normal. Wall thickness was increased in a pattern of moderate LVH. Systolic function was normal. The estimated ejection fraction was in the range of 55% to 60%. Wall motion was normal; there were no regional wall motion abnormalities. Doppler parameters are consistent with abnormal left ventricular relaxation (grade 1 diastolic dysfunction). - Left atrium: The atrium was mildly dilated.  History of Present Illness   Jay Fuller is a 58y.o. male w/ no prior cardiac history, PMHx significant for hepatitis C, hypertension and tobacco abuse who [facility] with the above problem list.  [place] had been off of his antihypertensives since that time.  The date of admission, he developed severe burning chest pain around 10 PM the night prior radiating from his right side across to the left, throat and jaw associated with palpitations, shortness of breath and dizziness. The pain lasted for approximately 4 hours he presented to urgent care for further evaluation. He was subsequently sent to the ED for further assessment.  An initial POC troponin returned markedly elevated at > 50.00. EKG revealed no ischemic changes. Chest x-ray as above revealed no acute process. He was pain-free on arrival. He was admitted and heparinized with plans for cardiac catheterization the following day.  Hospital Course   His blood pressure was markedly elevated. He was started on when necessary hydralazine and labetalol IV with improvement. He was started on carvedilol, low-dose lisinopril and atorvastatin as well. A urine drug screen did return positive for cocaine and THC. A half  evidence of mild thrombocytopenia.  He remained stable overnight with no acute events. The following morning, he was informed, consented and prepped for cardiac catheterization which was accessed via the right radial artery. For full details, please review the cath report above. Notably, he was loaded with Brilinta and two drug-eluting stents were placed to flow-limiting lesions in the distal LCx and proximal OM1. EF was preserved. He tolerated the procedure well and without complications. The recommendation was made to continue dual antiplatelet therapy-aspirin/Brilinta x12 months. Carvedilol and lisinopril were uptitrated.  He was observed for an additional day due to persistently elevated  blood pressures and significant troponin elevation. Carvedilol was further uptitrated. Blood pressures were better controlled with this.   He did have evidence of a mild hypokalemia yesterday which was successfully repleted. Mild hyponatremia was noted as well. LFTs did return elevated and statin was held. He has a history of hepatitis C. This has not been monitored in some time.  A 2D echo was performed, as above, revealing preserved EF, diastolic dysfunction and moderate LVH.  He was deemed to be stable for discharge this morning. He ambulated well and without complications with cardiac rehabilitation. He will be discharged on the medication regimen outlined below. Brilinta assistance has been arranged. A repeat CMET will be obtained on follow-up. He will need to establish primary care or with gastroenterology to monitor hepatitis C. Statin therapy may need to be deferred until this time. This information, including post-cath instructions and activity restrictions, has been clearly outlined in the discharge AVS.  Discharge Vitals:  Blood pressure 148/89, pulse 68, temperature 98.7 F (37.1 C), temperature source Oral, resp. rate 18, height '5\' 7"'  (1.702 m), weight 85 kg (187 lb 6.3 oz), SpO2 99.00%.   Labs: Recent Labs     06/23/13  0320  06/23/13  2202  06/24/13  0232  WBC  7.3   --   5.9  HGB  14.1   --   12.4*  HCT  41.2   --   36.8*  MCV  87.7   --   87.6  PLT  130*  125*  122*   Recent Labs Lab 06/23/13 0320 06/24/13 0232 06/25/13 0422  NA 141 140 136*  K 3.8 3.4* 3.7  CL 102 105 101  CO2 '26 23 21  ' BUN '10 12 10  ' CREATININE 1.06 1.18 1.10  CALCIUM 8.9 8.4 9.1  PROT  --   --  6.8  BILITOT  --   --  0.4  ALKPHOS  --   --  128*  ALT  --   --  82*  AST  --   --  133*  GLUCOSE 110* 140* 132*   Recent Labs     06/23/13  0320  HGBA1C  5.7*   Recent Labs     06/23/13  0007  06/23/13  0605  TROPONINI  >20.00*  >20.00*    Recent Labs     06/23/13  0500  CHOL  168   HDL  27*  LDLCALC  108*  TRIG  167*  CHOLHDL  6.2    Recent Labs  06/23/13 0320  TSH 1.840    Disposition:  Discharge Orders   Future Appointments Provider Department Dept Phone   07/04/2013 10:00 AM Rogelia Mire, NP [facility] [phone]   Future Orders Complete By Expires   Amb Referral to Cardiac Rehabilitation  As directed    Diet - low sodium heart healthy  As  directed    Increase activity slowly  As directed          Follow-up Information   Follow up with Murray Hodgkins, NP On 07/04/2013. (10:00 AM - Dr. Allison Quarry Nurse Practitioner)    Specialty:  Nurse Practitioner   Contact information:   [address] [place] [address] [phone]       Follow up with Primary care provider In 1 week. (Please follow-up or establish with a primary care provider within 1 week of discharge.)       Discharge Medications:    Medication List         aspirin 81 MG chewable tablet  Chew 81 mg by mouth daily.     carvedilol 25 MG tablet  Commonly known as:  COREG  Take 1 tablet (25 mg total) by mouth 2 (two) times daily with a meal.     lisinopril 10 MG tablet  Commonly known as:  PRINIVIL,ZESTRIL  Take 1 tablet (10 mg total) by mouth daily.     nitroGLYCERIN 0.4 MG SL tablet  Commonly known as:  NITROSTAT  Place 1 tablet (0.4 mg total) under the tongue every 5 (five) minutes x 3 doses as needed for chest pain.     Ticagrelor 90 MG Tabs tablet  Commonly known as:  BRILINTA  Take 1 tablet (90 mg total) by mouth 2 (two) times daily.       Outstanding Labs/Studies: CMET on follow-up  Duration of Discharge Encounter: Greater than 30 minutes including physician time.  Signed, R. Valeria Batman, PA-C 06/25/2013, 9:47 AM

## 2013-06-25 NOTE — Progress Notes (Signed)
Patient Name: Jay Fuller Date of Encounter: 06/25/2013     Principal Problem:   MI (myocardial infarction)- NSTEMI Active Problems:   HTN (hypertension)   Tobacco abuse   CAD (coronary artery disease)   Polysubstance abuse   Hyperlipidemia   Hypokalemia    SUBJECTIVE: Feels well this AM. Ambulated w/o incident. No cp or shortness of breath overnight. No wrist pain. "Done with cocaine."   OBJECTIVE  Filed Vitals:   06/24/13 2250 06/25/13 0020 06/25/13 0451 06/25/13 0740  BP: 183/86 187/81 177/89 148/89  Pulse:  77 83 68  Temp:  98.5 F (36.9 C) 98.8 F (37.1 C) 98.7 F (37.1 C)  TempSrc:  Oral Oral Oral  Resp:  18 20 18   Height:      Weight:  85 kg (187 lb 6.3 oz)    SpO2:  100% 99% 99%    Intake/Output Summary (Last 24 hours) at 06/25/13 8144 Last data filed at 06/24/13 1536  Gross per 24 hour  Intake      0 ml  Output    500 ml  Net   -500 ml   Weight change:   PHYSICAL EXAM  General: Well developed, well nourished, in no acute distress. Head: Normocephalic, atraumatic, sclera non-icteric, no xanthomas, nares are without discharge.  Neck: Supple without bruits or JVD. Lungs:  Resp regular and unlabored, CTAB w/o wheezes, rales or rhonchi Heart: RRR no s3, s4, or murmurs. Abdomen: Soft, non-tender, non-distended, BS + x 4.  Msk:  Strength and tone appears normal for age. Extremities: R wrist w/o tenderness or ecchymosis. No clubbing, cyanosis or edema. DP/PT/Radials 2+ and equal bilaterally. Neuro: Alert and oriented X 3. Moves all extremities spontaneously. Psych: Normal affect.  LABS:  Recent Labs     06/23/13  0320  06/23/13  2202  06/24/13  0232  WBC  7.3   --   5.9  HGB  14.1   --   12.4*  HCT  41.2   --   36.8*  MCV  87.7   --   87.6  PLT  130*  125*  122*   Recent Labs Lab 06/23/13 0320 06/24/13 0232 06/25/13 0422  NA 141 140 136*  K 3.8 3.4* 3.7  CL 102 105 101  CO2 26 23 21   BUN 10 12 10   CREATININE 1.06 1.18 1.10    CALCIUM 8.9 8.4 9.1  PROT  --   --  6.8  BILITOT  --   --  0.4  ALKPHOS  --   --  128*  ALT  --   --  82*  AST  --   --  133*  GLUCOSE 110* 140* 132*   Recent Labs     06/23/13  0320  HGBA1C  5.7*   Recent Labs     06/23/13  0007  06/23/13  0605  TROPONINI  >20.00*  >20.00*   Recent Labs     06/23/13  0500  CHOL  168  HDL  27*  LDLCALC  108*  TRIG  167*  CHOLHDL  6.2    Recent Labs  06/23/13 0320  TSH 1.840    TELE: NSR, no events   Radiology/Studies:  Dg Chest 2 View  06/22/2013   CLINICAL DATA:  Chest pain.  EXAM: CHEST  2 VIEW  COMPARISON:  None.  FINDINGS: Heart size is upper normal. Lungs are clear. No pneumothorax or pleural effusion.  IMPRESSION: No acute disease.   Electronically Signed  By: Inge Rise M.D.   On: 06/22/2013 19:49    Current Medications:  . aspirin EC  81 mg Oral Daily  . atorvastatin  80 mg Oral q1800  . carvedilol  12.5 mg Oral BID WC  . hydrALAZINE  10 mg Intravenous Once  . lisinopril  10 mg Oral Daily  . Ticagrelor  90 mg Oral BID    ASSESSMENT AND PLAN:  1. NSTEMI- in the setting of marked hypertension and + cocaine. S/p DES-LCx & OM 2/19. Preserved EF. Observed an additional day due to marked TnI elevation (> 20.00) and need for BP control. Elevated last PM and this AM before antihypertensives. More controlled after lisinopril administered. Echo- EF 55-60%, moderate LVH, grade 1 dd, mild LAE (stage B). -- Continue DAPT- ASA/Brilinta. No dyspnea. Brilinta assistance provided. Will continue x at least 1 month. If affordability an issue, can consider switching to Plavix at that time.  -- Increase carvedilol to 25mg  (alpha activity for BP effect, cocaine abuse) -- Continue lisinopril -- Hold statin due to elevated LFTs, recheck on follow-up -- Continue NTG SL PRN  2. Hypertension- SBP 170-180s last PM into this AM. Given demographic, may achieve greater BP effect w/ CCB, thiazide diuretic or vasodilator.  -- Re-evaluate  on follow-up  3. Elevated LFTs- AST 133/ALT 82.  -- Hold statin, avoid acetaminophen and EtOH -- Recheck on follow-up  4. Hyperlipidemia- LDL 108, HDL 27, TG 167, TC 168. Evidence of ASCVD.  -- Elevated LFTs preclude statins at this time  5. Polysubstance abuse- pt adamant on stopping cocaine. Re-affirmed importance of this on cardiovascular health and BP control.   6. Hyponatremia, mild 136 this AM.  -- CMET on follow-up     Signed, R. Valeria Batman, PA-C 06/25/2013, 8:38 AM  Cardiology Attending  Patient seen and examined. Agree with above history, physical exam assessment and plan. [place] for discharge home. Hopefully he will stop using cocaine.  Mikle Bosworth.D.

## 2013-06-27 NOTE — Telephone Encounter (Signed)
TCM Call--Patient has no questions regarding discharge instructions.  Reviewed his medication.  Aware of cardiology appointment for 3/2 at [address]  Denies chest pain or any other symptoms at this time.  Instructed to follow low sodium heart healthy diet and increase activity slowly as tolerated.  Pt verbalizes understanding of all of the above.

## 2013-07-04 ENCOUNTER — Ambulatory Visit (INDEPENDENT_AMBULATORY_CARE_PROVIDER_SITE_OTHER): Payer: No Typology Code available for payment source | Admitting: Nurse Practitioner

## 2013-07-04 ENCOUNTER — Encounter: Payer: Self-pay | Admitting: Nurse Practitioner

## 2013-07-04 VITALS — BP 169/90 | HR 64 | Ht 67.0 in | Wt 195.0 lb

## 2013-07-04 DIAGNOSIS — F1910 Other psychoactive substance abuse, uncomplicated: Secondary | ICD-10-CM

## 2013-07-04 DIAGNOSIS — B1920 Unspecified viral hepatitis C without hepatic coma: Secondary | ICD-10-CM

## 2013-07-04 DIAGNOSIS — I2510 Atherosclerotic heart disease of native coronary artery without angina pectoris: Secondary | ICD-10-CM

## 2013-07-04 DIAGNOSIS — E785 Hyperlipidemia, unspecified: Secondary | ICD-10-CM

## 2013-07-04 DIAGNOSIS — I10 Essential (primary) hypertension: Secondary | ICD-10-CM

## 2013-07-04 LAB — COMPREHENSIVE METABOLIC PANEL
ALBUMIN: 3.1 g/dL — AB (ref 3.5–5.2)
ALK PHOS: 101 U/L (ref 39–117)
ALT: 45 U/L (ref 0–53)
AST: 60 U/L — AB (ref 0–37)
BUN: 7 mg/dL (ref 6–23)
CO2: 29 mEq/L (ref 19–32)
Calcium: 9 mg/dL (ref 8.4–10.5)
Chloride: 104 mEq/L (ref 96–112)
Creatinine, Ser: 1.1 mg/dL (ref 0.4–1.5)
GFR: 85.62 mL/min (ref >60.00)
Glucose, Bld: 101 mg/dL — ABNORMAL HIGH (ref 70–99)
POTASSIUM: 4.1 meq/L (ref 3.5–5.1)
Sodium: 137 mEq/L (ref 135–145)
Total Bilirubin: 0.7 mg/dL (ref 0.3–1.2)
Total Protein: 7.5 g/dL (ref 6.0–8.3)

## 2013-07-04 MED ORDER — LISINOPRIL 20 MG PO TABS: 10.0000 mg | ORAL_TABLET | Freq: Every day | ORAL | Status: DC

## 2013-07-04 MED ORDER — ISOSORBIDE MONONITRATE ER 30 MG PO TB24: 30.0000 mg | ORAL_TABLET | Freq: Every day | ORAL | Status: DC

## 2013-07-04 MED ORDER — CLOPIDOGREL BISULFATE 75 MG PO TABS: 75.0000 mg | ORAL_TABLET | Freq: Every day | ORAL | Status: DC

## 2013-07-04 NOTE — Patient Instructions (Addendum)
Your physician has recommended you make the following change in your medication:   1. Increase Lisinopril to 20 mg daily 2. Start Plaxix 75 mg  1 tab daily  After you finish Brilinta 3. Start Imdur 30 mg daily  Your physician recommends that you have lab work today : [place] physician recommends that you schedule a follow-up appointment in: 2 months with [facility] with Dr. [name]

## 2013-07-04 NOTE — Progress Notes (Addendum)
Patient Name: Jay Fuller Date of Encounter: 07/04/2013  Primary Care Provider:  No primary provider on file. Primary Cardiologist:  Roni Bread    Patient Profile 58 yr old A.A. Male admitted 2/18  for 4 hrs of CP after cocaine use. Ruled in for NSTEMI with troponin >20.00. Underwent PCI /DES x 2 to distal LCx and proximal OM1. EF 55-60%.   Problem List   Past Medical History  Diagnosis Date  . CAD (coronary artery disease)     a. 06/2013 NSTEMI/Cath/PCI: LM nl, LAD 20-40d, Diags small with 95, LCX 20p, 99d (2.25x16 Promus Premier DES), OM1 80/90p(2.5x20 Promus Premier DES), OM2 min irregs, LPL1 small, min irregs, RCA/RPDA min irregs, RPL nl, EF 55%.  [name] HTN (hypertension)   . Hyperlipidemia   . Polysubstance abuse     a. cocaine, marijuana, tobacco.  . Tobacco abuse   . Hepatitis C    Past Surgical History  Procedure Laterality Date  . No past surgeries    . Coronary angioplasty with stent placement  06/2013    Allergies  No Known Allergies  HPI 58 yr old A.A. Male admitted to [facility] 2/18 for USA/NSTEMI with positive troponin >20, and positive for cocaine. Prior medical history includes Hep C, HTN, and tobacco abuse. Stoped taking antihypertensives about 4 months ago upon moving from [place]. Urine drug screen upon arrival to ED was positive for cocaine and THC and mild thrombocytopenia. He underwent radial cath, which revealed severe CAD involving OM1 and distal circumflex-LPL 1.  Both areas were successfully stented using Promus Premier DES's and he was discharged to home 06/25/2013 with ASA, carvedilol, lisinopril , SL NTG and Ticagrelor. Since his d/c he has done reasonably well.  He did have one episode of mild exertional chest pressure while walking last week.  He was able to slow his pace and Ss resolved after a 1-2 mins.  He has had no recurrence since.  He has quit smoking and says that he is done with cocaine.  He has been having an occasional glass of wine.  He denies  palpitations, dyspnea, pnd, orthopnea, n, v, dizziness, syncope, edema, weight gain, or early satiety.   Home Medications  Prior to Admission medications   Medication Sig Start Date End Date Taking? Authorizing Provider  aspirin 81 MG chewable tablet Chew 81 mg by mouth daily.   Yes Historical Provider, MD  carvedilol (COREG) 25 MG tablet Take 1 tablet (25 mg total) by mouth 2 (two) times daily with a meal. 06/25/13  Yes Roger A Arguello, PA-C  lisinopril (PRINIVIL,ZESTRIL) 10 MG tablet Take 1 tablet (10 mg total) by mouth daily. 06/25/13  Yes Roger A Arguello, PA-C  nitroGLYCERIN (NITROSTAT) 0.4 MG SL tablet Place 1 tablet (0.4 mg total) under the tongue every 5 (five) minutes x 3 doses as needed for chest pain. 06/25/13  Yes Roger A Arguello, PA-C  Ticagrelor (BRILINTA) 90 MG TABS tablet Take 1 tablet (90 mg total) by mouth 2 (two) times daily. 06/25/13  Yes Roger Hale Drone, PA-C   Review of Systems He denies palpitations, dyspnea, pnd, orthopnea, n, v, dizziness, syncope, edema, weight gain, or early satiety..  All other systems reviewed and are otherwise negative except as noted above.  Physical Exam  Blood pressure 169/90, pulse 64, height 5\' 7"  (1.702 m), weight 195 lb (88.451 kg).  General: Pleasant, NAD Psych: Normal affect. Neuro: Alert and oriented X 3. Moves all extremities spontaneously. HEENT: [place], AT. Sclera non-icteric. Conjunctiva non-injected. Turbinates non-injected.  Septum non deviated, No lymphedema or thyromegaly.   Neck: Supple without bruits or JVD.  Carotid upstroke 2+. Lungs:  Resp regular and unlabored, CTA. Heart: RRR no s3, s4, or murmurs. No lifts, heaves, or gallop. Cap refill <3 sec.  Abdomen: Soft, non-tender, non-distended, BS + x 4.  Extremities: No clubbing, cyanosis or edema. DP/PT/Radials 2+ and equal bilaterally.  r wrist cath site w/o  Bleeding/bruit/hematoma.  Accessory Clinical Findings  ECG - rsr, 64, no acute st/t changes.  Assessment &  Plan  1.  USA/NSTEMI: s/p recent pci/des to the lcx/om distribution.  He did have 1 episode of brief/mild exertional chest discomfort last week.  None since.  ECG is non-acute today.  His BP is quite high this morning.  Cont medical therapy including asa, brilinta, bb, acei.  Will titrate acei and also add imdur 30 daily.  Because he foresees issues with affording his brilinta, we will rx plavix 75mg  daily to start after completion of 30 day trial of brilinta.  He is not currently on a statin 2/2 elevated lft's while hospitalized.  We will f/u a cmet today.   2. HTN: Patient BP today is 169/90. He reports compliance with medication with no further drug use and smoking cessation since his hospital admission. He reports compliance with a low sodium cardiac diet. Will increase lisinipril to 20 mg PO QD. Will continue carvedilol. Encouraged patient to monitor BP at home. Will follow up with Dr. Ellyn Hack in 2 months.   3. HLD/elevated LFT's: Currently not on statin. Will re-draw CMET for evaluation of liver function and initiate statin if appropriate.  If he ends up on a statin, he will need close f/u due to h/o Hepatitis C.  4.  Polysubstance abuse:  He has quit smoking and says that he is done with cocaine.  He has been drinking wine.  I advised against this in the setting of elev LFT's and Hep C.  5.  Dispo: f/u Dr. Ellyn Hack in 2 mos.  Murray Hodgkins, NP 07/04/2013, 10:23 AM

## 2013-07-05 ENCOUNTER — Telehealth: Payer: Self-pay | Admitting: *Deleted

## 2013-07-05 ENCOUNTER — Other Ambulatory Visit: Payer: Self-pay | Admitting: *Deleted

## 2013-07-05 DIAGNOSIS — R945 Abnormal results of liver function studies: Principal | ICD-10-CM

## 2013-07-05 DIAGNOSIS — R7989 Other specified abnormal findings of blood chemistry: Secondary | ICD-10-CM

## 2013-07-05 MED ORDER — ATORVASTATIN CALCIUM 10 MG PO TABS: 10.0000 mg | ORAL_TABLET | Freq: Every day | ORAL | Status: DC

## 2013-07-05 NOTE — Telephone Encounter (Signed)
CARDIAC REHAB PHASE II  -SIGNED AND FAXED

## 2013-07-13 ENCOUNTER — Ambulatory Visit (INDEPENDENT_AMBULATORY_CARE_PROVIDER_SITE_OTHER): Payer: No Typology Code available for payment source | Admitting: Medical

## 2013-07-13 ENCOUNTER — Encounter: Payer: Self-pay | Admitting: Medical

## 2013-07-13 VITALS — BP 152/90 | HR 68 | Temp 97.9°F | Resp 14 | Ht 66.0 in | Wt 197.0 lb

## 2013-07-13 DIAGNOSIS — I10 Essential (primary) hypertension: Secondary | ICD-10-CM

## 2013-07-13 DIAGNOSIS — I2510 Atherosclerotic heart disease of native coronary artery without angina pectoris: Secondary | ICD-10-CM

## 2013-07-13 DIAGNOSIS — B1920 Unspecified viral hepatitis C without hepatic coma: Secondary | ICD-10-CM

## 2013-07-13 NOTE — Assessment & Plan Note (Signed)
Has followup with cardiology tomorrow, realizes that his blood pressures not at goal.  Compliant with medication

## 2013-07-13 NOTE — Assessment & Plan Note (Signed)
History of hepatitis C diagnosed several years ago. Has not been on treatment for this, has not seen hepatitis clinic but is interested at this time and starting treatment. He does note history of liver biopsy and ultrasound probably 2-3 years ago.

## 2013-07-13 NOTE — Progress Notes (Signed)
   Subjective:   Jay Fuller is a 58 y.o. male presenting on 07/13/2013 with establish as a new patient and Referral  HTN (hypertension) Has followup with cardiology tomorrow, realizes that his blood pressures not at goal.  Compliant with medication  CAD (coronary artery disease) Recently had a heart attack, had 2 stents placed, started on several medications, has followup with cardiology tomorrow  Hepatitis C History of hepatitis C diagnosed several years ago. Has not been on treatment for this, has not seen hepatitis clinic but is interested at this time and starting treatment. He does note history of liver biopsy and ultrasound probably 2-3 years ago.  Recently had pneumococcal and flu vaccines and hospital.  No other complaint.  Review of Systems ROS as in subjective      Objective:     Filed Vitals:   07/13/13 1002  BP: 152/90  Pulse: 68  Temp: 97.9 F (36.6 C)  Resp: 14    General appearance: alert, no distress, WD/WN, AA male Neck: supple, no lymphadenopathy, no thyromegaly, no masses Heart: RRR, normal S1, S2, no murmurs Lungs: CTA bilaterally, no wheezes, rhonchi, or rales Abdomen: +bs, soft, non tender, non distended, no masses, no hepatomegaly, no splenomegaly Pulses: 2+ symmetric, upper and lower extremities, normal cap refill Ext: no edema     Assessment: Encounter Diagnoses  Name Primary?  . Hepatitis C Yes  . HTN (hypertension)   . CAD (coronary artery disease)      Plan: We discussed his concerns , I reviewed some of his recent cardiac notes. He has followup with cardiology tomorrow, we'll defer blood pressure treatment to them. Baseline labs today for hepatitis.  Pending labs we'll likely refer to hepatitis clinic, likely update some additional vaccines, discussed possibly getting repeat abdominal ultrasound.  Advise household contacts be vaccinated for hepatitis A and B. We discussed general safety measures, preventative measures for  household contacts. Followup pending lab   Jay Fuller was seen today for establish as a new patient and referral.  Diagnoses and associated orders for this visit:  Hepatitis C - Hepatitis C antibody - Hepatitis C RNA quantitative - Hepatitis B surface antigen - Hepatitis B core antibody, IgM - Hepatitis B surface antibody  HTN (hypertension)  CAD (coronary artery disease)    Return pending labs.

## 2013-07-13 NOTE — Assessment & Plan Note (Signed)
Recently had a heart attack, had 2 stents placed, started on several medications, has followup with cardiology tomorrow

## 2013-07-14 ENCOUNTER — Encounter (HOSPITAL_COMMUNITY)
Admission: RE | Admit: 2013-07-14 | Discharge: 2013-07-14 | Disposition: A | Discharge status: Home / Self Care | Payer: No Typology Code available for payment source | Source: Ambulatory Visit | Attending: Cardiology | Admitting: Cardiology

## 2013-07-14 DIAGNOSIS — I252 Old myocardial infarction: Secondary | ICD-10-CM | POA: Insufficient documentation

## 2013-07-14 DIAGNOSIS — Z5189 Encounter for other specified aftercare: Secondary | ICD-10-CM | POA: Insufficient documentation

## 2013-07-14 DIAGNOSIS — Z9861 Coronary angioplasty status: Secondary | ICD-10-CM | POA: Insufficient documentation

## 2013-07-14 LAB — HEPATITIS B SURFACE ANTIGEN: Hepatitis B Surface Ag: NEGATIVE

## 2013-07-14 LAB — HEPATITIS C ANTIBODY: HCV Ab: REACTIVE — AB

## 2013-07-14 LAB — HEPATITIS B CORE ANTIBODY, IGM: Hep B C IgM: NONREACTIVE

## 2013-07-14 LAB — HEPATITIS B SURFACE ANTIBODY, QUANTITATIVE: HEPATITIS B-POST: 17.8 m[IU]/mL

## 2013-07-14 NOTE — Progress Notes (Signed)
Cardiac Rehab Medication Review by a Pharmacist  Does the patient  feel that his/her medications are working for him/her?  yes  Has the patient been experiencing any side effects to the medications prescribed?  no  Does the patient measure his/her own blood pressure or blood glucose at home?  no   Does the patient have any problems obtaining medications due to transportation or finances?   no  Understanding of regimen: excellent Understanding of indications: good Potential of compliance: excellent    Pharmacist comments: Jay Fuller describes a good understanding of his regimen and how to take his medicines. He notes no side effects or other issues with his medications. He uses a pillbox to help with adherence and says he never misses a dose and takes his medications "punctually." All questions were addressed at this visit.     Jay Fuller C. Sya Nestler, PharmD Clinical Pharmacist-Resident Pager: [phone] Pharmacy: [phone] 07/14/2013 9:10 AM

## 2013-07-15 LAB — HEPATITIS C RNA QUANTITATIVE
HCV QUANT LOG: 5.52 {Log} — AB (ref <1.18)
HCV Quantitative: 333000 IU/mL — ABNORMAL HIGH (ref <15)

## 2013-07-18 ENCOUNTER — Encounter (HOSPITAL_COMMUNITY)
Admission: RE | Admit: 2013-07-18 | Discharge: 2013-07-18 | Disposition: A | Discharge status: Home / Self Care | Payer: No Typology Code available for payment source | Source: Ambulatory Visit | Attending: Cardiology | Admitting: Cardiology

## 2013-07-18 NOTE — Progress Notes (Addendum)
Pt started cardiac rehab today.  Pt tolerated light exercise without difficulty. Vital signs stable. Will continue to monitor the patient throughout  the program. Telemetry rhythm Sinus.

## 2013-07-19 ENCOUNTER — Telehealth: Payer: Self-pay | Admitting: Family Medicine

## 2013-07-19 NOTE — Telephone Encounter (Signed)
PATIENT IS AWARE OF HIS APPOINTMENT AT THE [facility]. 08/03/13 @ 130 PM Jay Fuller, Jay Fuller

## 2013-07-20 ENCOUNTER — Encounter (HOSPITAL_COMMUNITY)
Admission: RE | Admit: 2013-07-20 | Discharge: 2013-07-20 | Disposition: A | Discharge status: Home / Self Care | Payer: No Typology Code available for payment source | Source: Ambulatory Visit | Attending: Cardiology | Admitting: Cardiology

## 2013-07-22 ENCOUNTER — Encounter (HOSPITAL_COMMUNITY): Payer: No Typology Code available for payment source

## 2013-07-22 ENCOUNTER — Other Ambulatory Visit (INDEPENDENT_AMBULATORY_CARE_PROVIDER_SITE_OTHER): Payer: No Typology Code available for payment source

## 2013-07-22 DIAGNOSIS — Z23 Encounter for immunization: Secondary | ICD-10-CM

## 2013-07-25 ENCOUNTER — Encounter (HOSPITAL_COMMUNITY): Payer: No Typology Code available for payment source

## 2013-07-27 ENCOUNTER — Telehealth: Payer: Self-pay | Admitting: *Deleted

## 2013-07-27 ENCOUNTER — Encounter (HOSPITAL_COMMUNITY)
Admission: RE | Admit: 2013-07-27 | Discharge: 2013-07-27 | Disposition: A | Discharge status: Home / Self Care | Payer: No Typology Code available for payment source | Source: Ambulatory Visit | Attending: Cardiology | Admitting: Cardiology

## 2013-07-27 MED ORDER — LISINOPRIL 20 MG PO TABS: 20.0000 mg | ORAL_TABLET | Freq: Every day | ORAL | Status: DC

## 2013-07-27 NOTE — Progress Notes (Signed)
Blood pressure 170/100 this afternoon,  Recheck blood 158/80 then 138/80. Jay Fuller says he is taking his coreg only once a day. Jay Fuller FNP_C called and notified of elevated blood pressure. Jay Fuller to call back and review his medications over the phone. No exercise today.

## 2013-07-27 NOTE — Progress Notes (Signed)
Jay Fuller called back he stopped taking his lisinopril because he thought he was supposed to take his isosorbide instead. Jay Fuller started taking isosorbide yesterday.  Jay Fuller says he does not have a prescription for 20 mg of lisinopril.  Dr Allison Quarry office called and notified. I spoke with [employer]. Dr Allison Quarry office will call the pharmacy for a prescription for 20 of lisinopril.  Jay Fuller will go the the pharmacy and pick up his new prescription. Will fax exercise flow sheets to Dr. Allison Quarry office for review. Jay Fuller will return to exercise on Monday.

## 2013-07-27 NOTE — Telephone Encounter (Signed)
Jay Fuller w/ Cardiac Rehab called.  Stated pt's BP was 170/100 there today and came down.  Stated she did speak w/ Cecilie Kicks (NP).  Stated when pt got home she was able to review his meds and pt has not been taking lisinopril or isosorbide.  Pt was advised to increase lisinopril to 20 mg daily when he saw Danna Hefty (on 3.2.15) and pt denied starting isosorbide or taking lisinopril.  Pt needs script for increased lisinopril.  Maria informed script will be sent to pharmacy on file for lisinopril 20 mg.  Verbalized understanding and stated she will call pt back.  Also stated pt started isosorbide today.  Scheduling notified to contact pt for 2 mo f/u appt in May as there was no Recall for this appt from OV on 3.2.15.  Dalene Seltzer will contact pt with appt.

## 2013-07-27 NOTE — Telephone Encounter (Signed)
Verdis Frederickson is calling in regards to Mr. Michalski BP being elevated today.  [place]

## 2013-07-29 ENCOUNTER — Encounter (HOSPITAL_COMMUNITY): Payer: No Typology Code available for payment source

## 2013-08-01 ENCOUNTER — Encounter (HOSPITAL_COMMUNITY)
Admission: RE | Admit: 2013-08-01 | Discharge: 2013-08-01 | Disposition: A | Discharge status: Home / Self Care | Payer: No Typology Code available for payment source | Source: Ambulatory Visit | Attending: Cardiology | Admitting: Cardiology

## 2013-08-01 NOTE — Progress Notes (Signed)
Reviewed home exercise with pt today.  Pt plans to continue walking at home for exercise.  Reviewed THR, pulse, RPE, sign and symptoms, NTG use, and when to call 911 or MD.  Pt voiced understanding. Rumaldo Difatta, MA, ACSM RCEP  

## 2013-08-01 NOTE — Progress Notes (Signed)
Mr Deskins returned to exercise at cardiac rehab today maximum exercise blood pressure 160/80. Exit blood pressure 140/80. No complaints voiced during exercise today. Will continue to monitor the patient throughout  the program.

## 2013-08-03 ENCOUNTER — Encounter (HOSPITAL_COMMUNITY): Payer: No Typology Code available for payment source

## 2013-08-03 ENCOUNTER — Other Ambulatory Visit: Payer: Self-pay | Admitting: Nurse Practitioner

## 2013-08-03 ENCOUNTER — Telehealth (HOSPITAL_COMMUNITY): Payer: Self-pay | Admitting: Medical

## 2013-08-03 DIAGNOSIS — C220 Liver cell carcinoma: Secondary | ICD-10-CM

## 2013-08-05 ENCOUNTER — Encounter (HOSPITAL_COMMUNITY): Payer: No Typology Code available for payment source

## 2013-08-08 ENCOUNTER — Encounter (HOSPITAL_COMMUNITY)
Admission: RE | Admit: 2013-08-08 | Discharge: 2013-08-08 | Disposition: A | Discharge status: Home / Self Care | Payer: No Typology Code available for payment source | Source: Ambulatory Visit | Attending: Cardiology | Admitting: Cardiology

## 2013-08-08 DIAGNOSIS — Z9861 Coronary angioplasty status: Secondary | ICD-10-CM | POA: Insufficient documentation

## 2013-08-08 DIAGNOSIS — Z5189 Encounter for other specified aftercare: Secondary | ICD-10-CM | POA: Insufficient documentation

## 2013-08-08 DIAGNOSIS — I252 Old myocardial infarction: Secondary | ICD-10-CM | POA: Insufficient documentation

## 2013-08-10 ENCOUNTER — Encounter (HOSPITAL_COMMUNITY)
Admission: RE | Admit: 2013-08-10 | Discharge: 2013-08-10 | Disposition: A | Discharge status: Home / Self Care | Payer: No Typology Code available for payment source | Source: Ambulatory Visit | Attending: Cardiology | Admitting: Cardiology

## 2013-08-10 NOTE — Progress Notes (Signed)
Jay Fuller 58 y.o. male Nutrition Note Spoke with pt.  Nutrition Survey reviewed with pt. Pt is following Step 2 of the Therapeutic Lifestyle Changes diet. Pt has little room for improvement. Pt reports he has been improving on eating healthier by decreasing his saturated fat and trans fat intake, limiting added sugars in his diet, and increasing his fruit and vegetable intake. Pt questioned about which cuts of meat are considered lean and what the heart healthy recommendations are on eating hot dogs and peanut butter. Pt's questions were appropriately answered. Pt is pre-diabetic. Pt was educated on what pre-diabetes is and the importance of exercising, losing weight, and following a heart healthy diet. Pt expressed understanding of the information reviewed. Pt aware of nutrition education classes offered and is unable to attend nutrition classes. Pt was given handouts ("Heart healthy eating" and "Making a lifestyle change") of the information that will be covered during the classes.  Nutrition Diagnosis   Food-and nutrition-related knowledge deficit related to lack of exposure to information as related to diagnosis of: ? CVD ? Pre-DM (A1c 5.7)    Obesity related to excessive energy intake as evidenced by a BMI of 31  Nutrition Intervention   Benefits of adopting Therapeutic Lifestyle Changes discussed when Med[employer]s reviewed.   Pt given handouts for: ? Nutrition I class ? Nutrition II class    Continue client-centered nutrition education by RD, as part of interdisciplinary care.  Goal(s)   Pt to describe the potential benefits of adopting Therapeutic Lifestyle Changes   Pt to identify and limit food sources of saturated fat, trans fat, and cholesterol   Pt to describe the benefit of including fruits, vegetables, whole grains, and low-fat dairy products in a heart healthy meal plan.  Monitor and Evaluate progress toward nutrition goal with team. Nutrition Risk:  [place] Intern  08/10/2013 3:38 PM Derek Mound, M.Ed, RD, LDN, CDE 08/11/2013 11:24 AM

## 2013-08-12 ENCOUNTER — Ambulatory Visit
Admission: RE | Admit: 2013-08-12 | Discharge: 2013-08-12 | Disposition: A | Discharge status: Home / Self Care | Payer: No Typology Code available for payment source | Source: Ambulatory Visit | Attending: Nurse Practitioner | Admitting: Nurse Practitioner

## 2013-08-12 ENCOUNTER — Encounter (HOSPITAL_COMMUNITY): Payer: No Typology Code available for payment source

## 2013-08-12 DIAGNOSIS — C220 Liver cell carcinoma: Secondary | ICD-10-CM

## 2013-08-15 ENCOUNTER — Encounter (HOSPITAL_COMMUNITY)
Admission: RE | Admit: 2013-08-15 | Discharge: 2013-08-15 | Disposition: A | Discharge status: Home / Self Care | Payer: No Typology Code available for payment source | Source: Ambulatory Visit | Attending: Cardiology | Admitting: Cardiology

## 2013-08-15 ENCOUNTER — Telehealth: Payer: Self-pay | Admitting: Cardiology

## 2013-08-15 ENCOUNTER — Other Ambulatory Visit (INDEPENDENT_AMBULATORY_CARE_PROVIDER_SITE_OTHER): Payer: No Typology Code available for payment source

## 2013-08-15 DIAGNOSIS — R945 Abnormal results of liver function studies: Principal | ICD-10-CM

## 2013-08-15 DIAGNOSIS — R7989 Other specified abnormal findings of blood chemistry: Secondary | ICD-10-CM

## 2013-08-15 LAB — LIPID PANEL
Cholesterol: 122 mg/dL (ref 0–200)
HDL: 29.3 mg/dL — AB (ref >39.00)
LDL Cholesterol: 44 mg/dL (ref 0–99)
Total CHOL/HDL Ratio: 4
Triglycerides: 243 mg/dL — ABNORMAL HIGH (ref 0.0–149.0)
VLDL: 48.6 mg/dL — ABNORMAL HIGH (ref 0.0–40.0)

## 2013-08-15 LAB — HEPATIC FUNCTION PANEL
ALK PHOS: 97 U/L (ref 39–117)
ALT: 39 U/L (ref 0–53)
AST: 62 U/L — ABNORMAL HIGH (ref 0–37)
Albumin: 3.4 g/dL — ABNORMAL LOW (ref 3.5–5.2)
BILIRUBIN DIRECT: 0.1 mg/dL (ref 0.0–0.3)
BILIRUBIN TOTAL: 0.4 mg/dL (ref 0.3–1.2)
Total Protein: 7.1 g/dL (ref 6.0–8.3)

## 2013-08-15 MED ORDER — LISINOPRIL 20 MG PO TABS: 20.0000 mg | ORAL_TABLET | Freq: Every day | ORAL | Status: DC

## 2013-08-15 NOTE — Telephone Encounter (Signed)
Not really sure what to make of this.  Probably a bit dehydrated.  If he feels dizzy - cut Lisinopril dose in half.  Leonie Man, MD

## 2013-08-15 NOTE — Addendum Note (Signed)
Addended by: Erasmo Downer R on: 08/15/2013 04:38 PM   Modules accepted: Orders

## 2013-08-15 NOTE — Telephone Encounter (Signed)
Returned call and informed pt per instructions by MD/PA.  Pt advised to increase water intake and cut lisinopril in half for 10 mg daily and to call back for BP check appt if no change.  Pt verbalized understanding and agreed w/ plan.

## 2013-08-15 NOTE — Telephone Encounter (Signed)
Spoke w/ Verdis Frederickson from Cardiac Rehab.  Stated pt said Friday he had dizziness and fell down a few stairs.  Exercising in rehab now.  No CP.  Orthostatics: lying 142/80, sitting 154/70 and standing 132/80.  No symptoms and is exercising now.  No complaints.  Denied hitting head when he fell.  Said he lost his balance.  Will fax EKG tracing and VS.  Informed Dr. Ellyn Hack will be notified.  Message forwarded to Dr. Ellyn Hack.

## 2013-08-15 NOTE — Progress Notes (Signed)
Jay Fuller told me that he did not come to class on Friday because he became dizzy and stumbled down the stairs at his daughters house.  Jay Fuller denied having any chest pain on Friday. Fred exercised today without complaints.  Telemetry rhythm Sinus rhythm 74. Orthostatic blood pressures checked. Lying BP 142/80.  Sitting blood pressure 154/70.  Standing blood pressure 132/80. Dr Allison Quarry office called and notified of the patient's complaints.Will continue to monitor the patient throughout  the program.

## 2013-08-15 NOTE — Telephone Encounter (Signed)
Marie from [facility] cardiac rehab

## 2013-08-17 ENCOUNTER — Encounter (HOSPITAL_COMMUNITY)
Admission: RE | Admit: 2013-08-17 | Discharge: 2013-08-17 | Disposition: A | Discharge status: Home / Self Care | Payer: No Typology Code available for payment source | Source: Ambulatory Visit | Attending: Cardiology | Admitting: Cardiology

## 2013-08-19 ENCOUNTER — Encounter (HOSPITAL_COMMUNITY): Payer: No Typology Code available for payment source

## 2013-08-22 ENCOUNTER — Encounter (HOSPITAL_COMMUNITY)
Admission: RE | Admit: 2013-08-22 | Discharge: 2013-08-22 | Disposition: A | Discharge status: Home / Self Care | Payer: No Typology Code available for payment source | Source: Ambulatory Visit | Attending: Cardiology | Admitting: Cardiology

## 2013-08-24 ENCOUNTER — Encounter (HOSPITAL_COMMUNITY): Payer: No Typology Code available for payment source

## 2013-08-26 ENCOUNTER — Encounter (HOSPITAL_COMMUNITY)
Admission: RE | Admit: 2013-08-26 | Discharge: 2013-08-26 | Disposition: A | Discharge status: Home / Self Care | Payer: No Typology Code available for payment source | Source: Ambulatory Visit | Attending: Cardiology | Admitting: Cardiology

## 2013-08-29 ENCOUNTER — Encounter (HOSPITAL_COMMUNITY)
Admission: RE | Admit: 2013-08-29 | Discharge: 2013-08-29 | Disposition: A | Discharge status: Home / Self Care | Payer: No Typology Code available for payment source | Source: Ambulatory Visit | Attending: Cardiology | Admitting: Cardiology

## 2013-08-31 ENCOUNTER — Encounter (HOSPITAL_COMMUNITY)
Admission: RE | Admit: 2013-08-31 | Discharge: 2013-08-31 | Disposition: A | Discharge status: Home / Self Care | Payer: No Typology Code available for payment source | Source: Ambulatory Visit | Attending: Cardiology | Admitting: Cardiology

## 2013-09-02 ENCOUNTER — Encounter (HOSPITAL_COMMUNITY): Admission: RE | Admit: 2013-09-02 | Payer: No Typology Code available for payment source | Source: Ambulatory Visit

## 2013-09-02 ENCOUNTER — Telehealth (HOSPITAL_COMMUNITY): Payer: Self-pay | Admitting: *Deleted

## 2013-09-05 ENCOUNTER — Encounter (HOSPITAL_COMMUNITY)
Admission: RE | Admit: 2013-09-05 | Discharge: 2013-09-05 | Disposition: A | Discharge status: Home / Self Care | Payer: No Typology Code available for payment source | Source: Ambulatory Visit | Attending: Cardiology | Admitting: Cardiology

## 2013-09-05 DIAGNOSIS — I252 Old myocardial infarction: Secondary | ICD-10-CM | POA: Insufficient documentation

## 2013-09-05 DIAGNOSIS — Z9861 Coronary angioplasty status: Secondary | ICD-10-CM | POA: Insufficient documentation

## 2013-09-05 DIAGNOSIS — Z5189 Encounter for other specified aftercare: Secondary | ICD-10-CM | POA: Insufficient documentation

## 2013-09-07 ENCOUNTER — Encounter (HOSPITAL_COMMUNITY): Payer: No Typology Code available for payment source

## 2013-09-08 ENCOUNTER — Encounter: Payer: No Typology Code available for payment source | Admitting: Cardiology

## 2013-09-09 ENCOUNTER — Encounter (HOSPITAL_COMMUNITY)
Admission: RE | Admit: 2013-09-09 | Discharge: 2013-09-09 | Disposition: A | Discharge status: Home / Self Care | Payer: No Typology Code available for payment source | Source: Ambulatory Visit | Attending: Cardiology | Admitting: Cardiology

## 2013-09-09 NOTE — Progress Notes (Signed)
Chart opened in ERROR.  Pt did not show for appointment.  Leonie Man, MD

## 2013-09-12 ENCOUNTER — Encounter (HOSPITAL_COMMUNITY)
Admission: RE | Admit: 2013-09-12 | Discharge: 2013-09-12 | Disposition: A | Discharge status: Home / Self Care | Payer: No Typology Code available for payment source | Source: Ambulatory Visit | Attending: Cardiology | Admitting: Cardiology

## 2013-09-14 ENCOUNTER — Encounter (HOSPITAL_COMMUNITY): Admission: RE | Admit: 2013-09-14 | Payer: No Typology Code available for payment source | Source: Ambulatory Visit

## 2013-09-16 ENCOUNTER — Encounter (HOSPITAL_COMMUNITY): Payer: No Typology Code available for payment source

## 2013-09-19 ENCOUNTER — Encounter (HOSPITAL_COMMUNITY): Payer: No Typology Code available for payment source

## 2013-09-21 ENCOUNTER — Encounter (HOSPITAL_COMMUNITY): Payer: No Typology Code available for payment source

## 2013-09-23 ENCOUNTER — Encounter (HOSPITAL_COMMUNITY): Payer: No Typology Code available for payment source

## 2013-09-28 ENCOUNTER — Encounter (HOSPITAL_COMMUNITY): Payer: No Typology Code available for payment source

## 2013-09-30 ENCOUNTER — Telehealth (HOSPITAL_COMMUNITY): Payer: Self-pay | Admitting: *Deleted

## 2013-09-30 ENCOUNTER — Encounter (HOSPITAL_COMMUNITY): Payer: No Typology Code available for payment source

## 2013-10-03 ENCOUNTER — Encounter (HOSPITAL_COMMUNITY)
Admission: RE | Admit: 2013-10-03 | Discharge: 2013-10-03 | Disposition: A | Discharge status: Home / Self Care | Payer: No Typology Code available for payment source | Source: Ambulatory Visit | Attending: Cardiology | Admitting: Cardiology

## 2013-10-03 DIAGNOSIS — Z5189 Encounter for other specified aftercare: Secondary | ICD-10-CM | POA: Insufficient documentation

## 2013-10-03 DIAGNOSIS — Z9861 Coronary angioplasty status: Secondary | ICD-10-CM | POA: Insufficient documentation

## 2013-10-03 DIAGNOSIS — I252 Old myocardial infarction: Secondary | ICD-10-CM | POA: Insufficient documentation

## 2013-10-05 ENCOUNTER — Encounter (HOSPITAL_COMMUNITY)
Admission: RE | Admit: 2013-10-05 | Discharge: 2013-10-05 | Disposition: A | Discharge status: Home / Self Care | Payer: No Typology Code available for payment source | Source: Ambulatory Visit | Attending: Cardiology | Admitting: Cardiology

## 2013-10-07 ENCOUNTER — Encounter (HOSPITAL_COMMUNITY)
Admission: RE | Admit: 2013-10-07 | Discharge: 2013-10-07 | Disposition: A | Discharge status: Home / Self Care | Payer: No Typology Code available for payment source | Source: Ambulatory Visit | Attending: Cardiology | Admitting: Cardiology

## 2013-10-10 ENCOUNTER — Encounter (HOSPITAL_COMMUNITY)
Admission: RE | Admit: 2013-10-10 | Discharge: 2013-10-10 | Disposition: A | Discharge status: Home / Self Care | Payer: No Typology Code available for payment source | Source: Ambulatory Visit | Attending: Cardiology | Admitting: Cardiology

## 2013-10-12 ENCOUNTER — Encounter (HOSPITAL_COMMUNITY): Payer: No Typology Code available for payment source

## 2013-10-14 ENCOUNTER — Encounter (HOSPITAL_COMMUNITY): Payer: No Typology Code available for payment source

## 2013-10-14 ENCOUNTER — Other Ambulatory Visit: Payer: Self-pay | Admitting: Cardiology

## 2013-10-14 NOTE — Telephone Encounter (Signed)
Rx refill sent to patient pharmacy   

## 2013-10-15 ENCOUNTER — Other Ambulatory Visit (HOSPITAL_COMMUNITY): Payer: Self-pay | Admitting: Physician Assistant

## 2013-10-17 ENCOUNTER — Encounter (HOSPITAL_COMMUNITY)
Admission: RE | Admit: 2013-10-17 | Discharge: 2013-10-17 | Disposition: A | Discharge status: Home / Self Care | Payer: No Typology Code available for payment source | Source: Ambulatory Visit | Attending: Cardiology | Admitting: Cardiology

## 2013-10-17 NOTE — Telephone Encounter (Signed)
Rx was sent to pharmacy electronically. 

## 2013-10-19 ENCOUNTER — Encounter (HOSPITAL_COMMUNITY)
Admission: RE | Admit: 2013-10-19 | Discharge: 2013-10-19 | Disposition: A | Discharge status: Home / Self Care | Payer: No Typology Code available for payment source | Source: Ambulatory Visit | Attending: Cardiology | Admitting: Cardiology

## 2013-10-21 ENCOUNTER — Encounter (HOSPITAL_COMMUNITY)
Admission: RE | Admit: 2013-10-21 | Discharge: 2013-10-21 | Disposition: A | Discharge status: Home / Self Care | Payer: No Typology Code available for payment source | Source: Ambulatory Visit | Attending: Cardiology | Admitting: Cardiology

## 2013-10-21 ENCOUNTER — Telehealth: Payer: Self-pay | Admitting: *Deleted

## 2013-10-21 ENCOUNTER — Telehealth: Payer: Self-pay | Admitting: Cardiology

## 2013-10-21 NOTE — Telephone Encounter (Signed)
Spoke with Verdis Frederickson and patient. He needs refill of carvedilol BID. RN informed patient that refill was sent in 6/15 and RN confirmed this with pharmacy.

## 2013-10-21 NOTE — Telephone Encounter (Signed)
Patient called and advised of recommendations per Lurena Joiner, [place] - patient voiced understanding and agreed with plan.

## 2013-10-21 NOTE — Telephone Encounter (Signed)
I doubt he is addicted to nicotine at 3 cigarettes a day. He should try Nicorette gum.  Kerin Ransom PA-C 10/21/2013 4:34 PM

## 2013-10-21 NOTE — Telephone Encounter (Signed)
Patient in cardiac rehab. Reports patient had been smoking 3 cigarettes/day but quit cold [place] about 3 days ago and he feels as if he is going through nicotine withdrawal. She reports his BP is elevated a little at 161-096E systolic. RN and patient think they could benefit from nicotine patch prescription  Will defer to [place], [place] to advise - as Dr. Ellyn Hack is out of office until 6/23

## 2013-10-21 NOTE — Progress Notes (Signed)
Jay Fuller said he stopped smoking 3 days ago after he watched open heart surgery on TV. " I saw the man's area around his heart looked black and it scared me!"  Jay Fuller said he was smoking 3 cigarettes a day before that.Jay Fuller said he is interested in trying a Nicotine patch. Dr Allison Quarry office called and notified. I spoke with [place] who will check with Kerin Ransom [facility] and get back with the patient later.

## 2013-10-24 ENCOUNTER — Encounter (HOSPITAL_COMMUNITY)
Admission: RE | Admit: 2013-10-24 | Discharge: 2013-10-24 | Disposition: A | Discharge status: Home / Self Care | Payer: No Typology Code available for payment source | Source: Ambulatory Visit | Attending: Cardiology | Admitting: Cardiology

## 2013-10-25 ENCOUNTER — Other Ambulatory Visit: Payer: Self-pay | Admitting: Nurse Practitioner

## 2013-10-25 DIAGNOSIS — R772 Abnormality of alphafetoprotein: Secondary | ICD-10-CM

## 2013-10-26 ENCOUNTER — Encounter (HOSPITAL_COMMUNITY)
Admission: RE | Admit: 2013-10-26 | Discharge: 2013-10-26 | Disposition: A | Discharge status: Home / Self Care | Payer: No Typology Code available for payment source | Source: Ambulatory Visit | Attending: Cardiology | Admitting: Cardiology

## 2013-10-28 ENCOUNTER — Encounter (HOSPITAL_COMMUNITY): Payer: No Typology Code available for payment source

## 2013-10-31 ENCOUNTER — Encounter (HOSPITAL_COMMUNITY)
Admission: RE | Admit: 2013-10-31 | Discharge: 2013-10-31 | Disposition: A | Discharge status: Home / Self Care | Payer: No Typology Code available for payment source | Source: Ambulatory Visit | Attending: Cardiology | Admitting: Cardiology

## 2013-10-31 ENCOUNTER — Other Ambulatory Visit: Payer: Self-pay | Admitting: Nurse Practitioner

## 2013-10-31 DIAGNOSIS — R772 Abnormality of alphafetoprotein: Secondary | ICD-10-CM

## 2013-11-01 ENCOUNTER — Ambulatory Visit
Admission: RE | Admit: 2013-11-01 | Discharge: 2013-11-01 | Disposition: A | Discharge status: Home / Self Care | Payer: No Typology Code available for payment source | Source: Ambulatory Visit | Attending: Nurse Practitioner | Admitting: Nurse Practitioner

## 2013-11-01 ENCOUNTER — Other Ambulatory Visit: Payer: No Typology Code available for payment source

## 2013-11-01 DIAGNOSIS — R772 Abnormality of alphafetoprotein: Secondary | ICD-10-CM

## 2013-11-01 MED ORDER — IOHEXOL 350 MG/ML SOLN
125.0000 mL | Freq: Once | INTRAVENOUS | Status: AC | PRN
  Administered 2013-11-01: 125 mL via INTRAVENOUS

## 2013-11-02 ENCOUNTER — Encounter (HOSPITAL_COMMUNITY)
Admission: RE | Admit: 2013-11-02 | Discharge: 2013-11-02 | Disposition: A | Discharge status: Home / Self Care | Payer: No Typology Code available for payment source | Source: Ambulatory Visit | Attending: Cardiology | Admitting: Cardiology

## 2013-11-02 DIAGNOSIS — Z5189 Encounter for other specified aftercare: Secondary | ICD-10-CM | POA: Insufficient documentation

## 2013-11-02 DIAGNOSIS — I252 Old myocardial infarction: Secondary | ICD-10-CM | POA: Insufficient documentation

## 2013-11-02 DIAGNOSIS — Z9861 Coronary angioplasty status: Secondary | ICD-10-CM | POA: Insufficient documentation

## 2013-11-07 ENCOUNTER — Encounter (HOSPITAL_COMMUNITY): Payer: No Typology Code available for payment source

## 2013-11-09 ENCOUNTER — Encounter (HOSPITAL_COMMUNITY)
Admission: RE | Admit: 2013-11-09 | Discharge: 2013-11-09 | Disposition: A | Discharge status: Home / Self Care | Payer: No Typology Code available for payment source | Source: Ambulatory Visit | Attending: Cardiology | Admitting: Cardiology

## 2013-11-11 ENCOUNTER — Encounter (HOSPITAL_COMMUNITY)
Admission: RE | Admit: 2013-11-11 | Discharge: 2013-11-11 | Disposition: A | Discharge status: Home / Self Care | Payer: No Typology Code available for payment source | Source: Ambulatory Visit | Attending: Cardiology | Admitting: Cardiology

## 2013-11-14 ENCOUNTER — Encounter (HOSPITAL_COMMUNITY)
Admission: RE | Admit: 2013-11-14 | Discharge: 2013-11-14 | Disposition: A | Discharge status: Home / Self Care | Payer: No Typology Code available for payment source | Source: Ambulatory Visit | Attending: Cardiology | Admitting: Cardiology

## 2013-11-16 ENCOUNTER — Encounter (HOSPITAL_COMMUNITY)
Admission: RE | Admit: 2013-11-16 | Discharge: 2013-11-16 | Disposition: A | Discharge status: Home / Self Care | Payer: No Typology Code available for payment source | Source: Ambulatory Visit | Attending: Cardiology | Admitting: Cardiology

## 2013-11-18 ENCOUNTER — Encounter (HOSPITAL_COMMUNITY)
Admission: RE | Admit: 2013-11-18 | Discharge: 2013-11-18 | Disposition: A | Discharge status: Home / Self Care | Payer: No Typology Code available for payment source | Source: Ambulatory Visit | Attending: Cardiology | Admitting: Cardiology

## 2013-11-18 NOTE — Progress Notes (Signed)
[employer] today and plans to continue exercise via walking.

## 2013-11-27 ENCOUNTER — Other Ambulatory Visit: Payer: Self-pay | Admitting: Nurse Practitioner

## 2013-11-29 ENCOUNTER — Telehealth: Payer: Self-pay

## 2013-11-29 NOTE — Telephone Encounter (Signed)
refill 

## 2013-12-01 NOTE — Telephone Encounter (Signed)
Rx was sent to pharmacy electronically. 

## 2013-12-15 ENCOUNTER — Other Ambulatory Visit: Payer: Self-pay | Admitting: Nurse Practitioner

## 2014-01-30 ENCOUNTER — Other Ambulatory Visit: Payer: Self-pay | Admitting: Nurse Practitioner

## 2014-01-30 DIAGNOSIS — C220 Liver cell carcinoma: Secondary | ICD-10-CM

## 2014-02-07 ENCOUNTER — Ambulatory Visit
Admission: RE | Admit: 2014-02-07 | Discharge: 2014-02-07 | Disposition: A | Discharge status: Home / Self Care | Payer: No Typology Code available for payment source | Source: Ambulatory Visit | Attending: Nurse Practitioner | Admitting: Nurse Practitioner

## 2014-02-07 ENCOUNTER — Encounter (INDEPENDENT_AMBULATORY_CARE_PROVIDER_SITE_OTHER): Payer: Self-pay

## 2014-02-07 DIAGNOSIS — C220 Liver cell carcinoma: Secondary | ICD-10-CM

## 2014-03-11 ENCOUNTER — Other Ambulatory Visit: Payer: Self-pay | Admitting: Cardiology

## 2014-03-13 NOTE — Telephone Encounter (Signed)
E sent to pharmacy 

## 2014-04-13 ENCOUNTER — Encounter (HOSPITAL_COMMUNITY): Payer: Self-pay | Admitting: Cardiology

## 2014-05-05 DIAGNOSIS — Z789 Other specified health status: Secondary | ICD-10-CM

## 2014-05-05 HISTORY — PX: LIVER BIOPSY: SHX301

## 2014-05-05 HISTORY — DX: Other specified health status: Z78.9

## 2014-07-10 ENCOUNTER — Other Ambulatory Visit: Payer: Self-pay | Admitting: Nurse Practitioner

## 2014-07-10 ENCOUNTER — Other Ambulatory Visit: Payer: Self-pay | Admitting: Cardiology

## 2014-07-10 NOTE — Telephone Encounter (Signed)
Rx(s) sent to pharmacy electronically.  

## 2014-07-24 ENCOUNTER — Ambulatory Visit (INDEPENDENT_AMBULATORY_CARE_PROVIDER_SITE_OTHER): Payer: PRIVATE HEALTH INSURANCE | Admitting: Medical

## 2014-07-24 ENCOUNTER — Encounter: Payer: Self-pay | Admitting: Medical

## 2014-07-24 ENCOUNTER — Telehealth: Payer: Self-pay | Admitting: Medical

## 2014-07-24 VITALS — BP 132/90 | HR 71 | Resp 14 | Wt 204.0 lb

## 2014-07-24 DIAGNOSIS — Z125 Encounter for screening for malignant neoplasm of prostate: Secondary | ICD-10-CM | POA: Diagnosis not present

## 2014-07-24 DIAGNOSIS — Z87891 Personal history of nicotine dependence: Secondary | ICD-10-CM | POA: Diagnosis not present

## 2014-07-24 DIAGNOSIS — Z1211 Encounter for screening for malignant neoplasm of colon: Secondary | ICD-10-CM

## 2014-07-24 DIAGNOSIS — Z23 Encounter for immunization: Secondary | ICD-10-CM | POA: Diagnosis not present

## 2014-07-24 DIAGNOSIS — I10 Essential (primary) hypertension: Secondary | ICD-10-CM

## 2014-07-24 DIAGNOSIS — E785 Hyperlipidemia, unspecified: Secondary | ICD-10-CM | POA: Diagnosis not present

## 2014-07-24 DIAGNOSIS — I2510 Atherosclerotic heart disease of native coronary artery without angina pectoris: Secondary | ICD-10-CM

## 2014-07-24 DIAGNOSIS — B182 Chronic viral hepatitis C: Secondary | ICD-10-CM | POA: Diagnosis not present

## 2014-07-24 LAB — COMPREHENSIVE METABOLIC PANEL
ALK PHOS: 96 U/L (ref 39–117)
ALT: 12 U/L (ref 0–53)
AST: 23 U/L (ref 0–37)
Albumin: 4.2 g/dL (ref 3.5–5.2)
BUN: 9 mg/dL (ref 6–23)
CO2: 27 mEq/L (ref 19–32)
CREATININE: 1.07 mg/dL (ref 0.50–1.35)
Calcium: 9.3 mg/dL (ref 8.4–10.5)
Chloride: 104 mEq/L (ref 96–112)
Glucose, Bld: 123 mg/dL — ABNORMAL HIGH (ref 70–99)
Potassium: 4.3 mEq/L (ref 3.5–5.3)
Sodium: 139 mEq/L (ref 135–145)
Total Bilirubin: 0.5 mg/dL (ref 0.2–1.2)
Total Protein: 7 g/dL (ref 6.0–8.3)

## 2014-07-24 LAB — CBC
HCT: 44.3 % (ref 39.0–52.0)
Hemoglobin: 14.6 g/dL (ref 13.0–17.0)
MCH: 27.8 pg (ref 26.0–34.0)
MCHC: 33 g/dL (ref 30.0–36.0)
MCV: 84.2 fL (ref 78.0–100.0)
MPV: 10.4 fL (ref 8.6–12.4)
Platelets: 166 10*3/uL (ref 150–400)
RBC: 5.26 MIL/uL (ref 4.22–5.81)
RDW: 15 % (ref 11.5–15.5)
WBC: 7.1 10*3/uL (ref 4.0–10.5)

## 2014-07-24 LAB — LIPID PANEL
CHOL/HDL RATIO: 4 ratio
CHOLESTEROL: 137 mg/dL (ref 0–200)
HDL: 34 mg/dL — ABNORMAL LOW (ref >=40)
LDL Cholesterol: 77 mg/dL (ref 0–99)
Triglycerides: 129 mg/dL (ref <150)
VLDL: 26 mg/dL (ref 0–40)

## 2014-07-24 MED ORDER — ASPIRIN 81 MG PO CHEW: 81.0000 mg | CHEWABLE_TABLET | Freq: Every day | ORAL | Status: DC

## 2014-07-24 MED ORDER — VARENICLINE TARTRATE 0.5 MG X 11 & 1 MG X 42 PO MISC: ORAL | Status: DC

## 2014-07-24 MED ORDER — CLOPIDOGREL BISULFATE 75 MG PO TABS: 75.0000 mg | ORAL_TABLET | Freq: Every day | ORAL | Status: DC

## 2014-07-24 MED ORDER — CARVEDILOL 25 MG PO TABS: 25.0000 mg | ORAL_TABLET | Freq: Two times a day (BID) | ORAL | Status: DC

## 2014-07-24 MED ORDER — LISINOPRIL-HYDROCHLOROTHIAZIDE 20-12.5 MG PO TABS: 1.0000 | ORAL_TABLET | Freq: Every day | ORAL | Status: DC

## 2014-07-24 NOTE — Telephone Encounter (Signed)
Refer for  first screening colonoscopy for colon cancer screening.  Send copy of today's note to hepatitis clinic

## 2014-07-24 NOTE — Patient Instructions (Signed)
Encounter Diagnoses  Name Primary?  . Essential hypertension Yes  . Hyperlipidemia   . Coronary artery disease involving native coronary artery of native heart without angina pectoris   . Former smoker   . Chronic hepatitis C without hepatic coma   . Special screening for malignant neoplasms, colon   . Screening for prostate cancer   . Need for prophylactic vaccination and inoculation against viral hepatitis    Recommendations:  We will call you with lab results  Stop lisinopril 20 mg daily, begin lisinopril HCT 20/12.5 mg daily  Continue Coreg for blood pressure twice daily  We will refer you for first screening colonoscopy  You are due back at this time with your cardiologist and hepatitis clinic  Begin Chantix starter pack for smoking cessation  YOU CAN QUIT SMOKING!  Talk to your medical provider about using medicines to help you quit. These include nicotine replacement gum, lozenges, or skin patches.  Consider calling 1-800-QUIT-NOW, a toll free 24/7 hotline with free counseling to help you quit.  If you are ready to quit smoking or are thinking about it, congratulations! You have chosen to help yourself be healthier and live longer! There are lots of different ways to quit smoking. Nicotine gum, nicotine patches, a nicotine inhaler, or nicotine nasal spray can help with physical craving. Hypnosis, support groups, and medicines help break the habit of smoking. TIPS TO GET OFF AND STAY OFF CIGARETTES  Learn to predict your moods. Do not let a bad situation be your excuse to have a cigarette. Some situations in your life might tempt you to have a cigarette.   Ask friends and co-workers not to smoke around you.   Make your home smoke-free.   Never have "just one" cigarette. It leads to wanting another and another. Remind yourself of your decision to quit.   On a card, make a list of your reasons for not smoking. Read it at least the same number of times a day as you have a  cigarette. Tell yourself everyday, "I do not want to smoke. I choose not to smoke."   Ask someone at home or work to help you with your plan to quit smoking.   Have something planned after you eat or have a cup of coffee. Take a walk or get other exercise to perk you up. This will help to keep you from overeating.   Try a relaxation exercise to calm you down and decrease your stress. Remember, you may be tense and nervous the first two weeks after you quit. This will pass.   Find new activities to keep your hands busy. Play with a pen, coin, or rubber band. Doodle or draw things on paper.   Brush your teeth right after eating. This will help cut down the craving for the taste of tobacco after meals. You can try mouthwash too.   Try gum, breath mints, or diet candy to keep something in your mouth.  IF YOU SMOKE AND WANT TO QUIT:  Do not stock up on cigarettes. Never buy a carton. Wait until one pack is finished before you buy another.   Never carry cigarettes with you at work or at home.   Keep cigarettes as far away from you as possible. Leave them with someone else.   Never carry matches or a lighter with you.   Ask yourself, "Do I need this cigarette or is this just a reflex?"   Bet with someone that you can quit. Put cigarette  money in a piggy bank every morning. If you smoke, you give up the money. If you do not smoke, by the end of the week, you keep the money.   Keep trying. It takes 21 days to change a habit!  Document Released: 02/15/2009 Document Revised: 01/01/2011 Document Reviewed: 02/15/2009 [facility] Patient Information 2012 [place].

## 2014-07-24 NOTE — Progress Notes (Signed)
Subjective: Here for routine follow-up and med check. He is fasting today. He has been doing fine in normal state of health. I last saw him about a year ago as a new patient. Since last year he has completed 12 weeks of Harvoni treatment for hepatitis C and at last check was in remission.  He has been attending cardiac rehabilitation regularly this past year.  He is compliant with his medications without side effects. His only concern today is to have a medication to help cravings for tobacco as he quit tobacco 2 weeks ago. He has not been on medication before for tobacco cessation. He notes that his blood pressures usually stay in the 130/80-90 range No other complaint  Past Medical History  Diagnosis Date  . CAD (coronary artery disease)     a. 06/2013 NSTEMI/Cath/PCI: LM nl, LAD 20-40d, Diags small with 95, LCX 20p, 99d (2.25x16 Promus Premier DES), OM1 80/90p(2.5x20 Promus Premier DES), OM2 min irregs, LPL1 small, min irregs, RCA/RPDA min irregs, RPL nl, EF 55%.  [name] HTN (hypertension)   . Hyperlipidemia   . Polysubstance abuse     a. cocaine, marijuana, tobacco.  . Tobacco abuse     quit 06/2013  . Hepatitis C     s/p Harvoni therapy 2015   Review of systems as in subjective  Objective: BP 132/90 mmHg  Pulse 71  Resp 14  Wt 204 lb (92.534 kg)  General appearence: alert, no distress, WD/WN, AA male HEENT: normocephalic, sclerae anicteric, TMs pearly, nares patent, no discharge or erythema, pharynx normal Oral cavity: MMM, no lesions Neck: supple, no lymphadenopathy, no thyromegaly, no masses Heart: RRR, normal S1, S2, no murmurs Lungs: CTA bilaterally, no wheezes, rhonchi, or rales Abdomen: +bs, soft, non tender, non distended, no masses, no hepatomegaly, no splenomegaly Pulses: 2+ symmetric, upper and lower extremities, normal cap refill   Assessment:  Encounter Diagnoses  Name Primary?  . Essential hypertension Yes  . Hyperlipidemia   . Coronary artery disease involving  native coronary artery of native heart without angina pectoris   . Former smoker   . Chronic hepatitis C without hepatic coma   . Special screening for malignant neoplasms, colon   . Screening for prostate cancer   . Need for prophylactic vaccination and inoculation against viral hepatitis    Plan: Hypertension-continue Coreg 25 mg twice daily, stop lisinopril 20 and change to lisinopril HCT 20/12.5 mg daily Hyperlipidemia-fasting labs today, compliant with Lipitor 20 mg daily and aspirin 81 mg nightly Coronary artery disease-due for follow-up at this time a cardiology. Continue Plavix Former smoker-congratulated him on quitting 2 weeks ago and discussed medication options, discussed counseling, begin trial of Chantix starting pack. Discussed risk and benefits of medication Chronic hepatitis C-reviewed prior hepatitis C notes and he is due back at this time with hepatitis clinic Referral for first screening colonoscopy Labs today including PSA screening baseline Advise he follow-up with hepatitis clinic for second hepatitis A vaccine if he needs this. I cannot determine from the hepatitis clinic records. He had his first hepatitis A vaccine here a year ago

## 2014-07-25 ENCOUNTER — Encounter: Payer: Self-pay | Admitting: Gastroenterology

## 2014-07-25 LAB — PSA: PSA: 0.83 ng/mL (ref <=4.00)

## 2014-07-25 NOTE — Telephone Encounter (Signed)
Sep 25, 2014 AT 1030 AM SCREENING COLONOSCOPY Sep 11, 2014 AT [facility] [place], [place]  I MAILED THE PATIENT HIS APPOINTMENT INFORMATION

## 2014-07-30 ENCOUNTER — Other Ambulatory Visit: Payer: Self-pay | Admitting: Nurse Practitioner

## 2014-07-31 ENCOUNTER — Telehealth: Payer: Self-pay | Admitting: Medical

## 2014-07-31 NOTE — Telephone Encounter (Signed)
Jay Fuller should I refill the Lipitor or not. Patient was not sure.

## 2014-07-31 NOTE — Telephone Encounter (Signed)
Requesting refill on Atorvastatin 20MG . Pt says he is not sure if he should still be on this med. He says Jay Fuller was going to review labs and refill meds but this med not refilled.

## 2014-08-01 ENCOUNTER — Other Ambulatory Visit: Payer: Self-pay | Admitting: Medical

## 2014-08-01 MED ORDER — ATORVASTATIN CALCIUM 20 MG PO TABS: 20.0000 mg | ORAL_TABLET | Freq: Every day | ORAL | Status: DC

## 2014-08-01 NOTE — Telephone Encounter (Signed)
I thought I had resent, so I did resend medication today. He should remain on this for heart disease risk reduction.   It was my understanding that he was still taking this regularly, and the numbers looked good, so yes continue this

## 2014-08-01 NOTE — Telephone Encounter (Signed)
Patient is aware of Dorothea Ogle PA message in detail and he understood

## 2014-08-09 ENCOUNTER — Other Ambulatory Visit: Payer: Self-pay | Admitting: Nurse Practitioner

## 2014-08-09 DIAGNOSIS — C220 Liver cell carcinoma: Secondary | ICD-10-CM

## 2014-08-13 ENCOUNTER — Other Ambulatory Visit: Payer: Self-pay | Admitting: Nurse Practitioner

## 2014-08-14 ENCOUNTER — Telehealth: Payer: Self-pay | Admitting: Medical

## 2014-08-14 ENCOUNTER — Other Ambulatory Visit: Payer: Self-pay | Admitting: Medical

## 2014-08-14 MED ORDER — ISOSORBIDE MONONITRATE ER 30 MG PO TB24: 30.0000 mg | ORAL_TABLET | Freq: Every day | ORAL | Status: DC

## 2014-08-14 NOTE — Telephone Encounter (Signed)
Requesting refill on Isosorbide 30MG . Pharmacy sent refill request to Dr Sharolyn Douglas but pt says he has not seen that doctor in over a year and Audelia Acton is his doctor that is maintaining his meds now

## 2014-08-16 ENCOUNTER — Ambulatory Visit
Admission: RE | Admit: 2014-08-16 | Discharge: 2014-08-16 | Disposition: A | Discharge status: Home / Self Care | Payer: Medicaid Other | Source: Ambulatory Visit | Attending: Nurse Practitioner | Admitting: Nurse Practitioner

## 2014-08-16 DIAGNOSIS — C220 Liver cell carcinoma: Secondary | ICD-10-CM

## 2014-09-08 ENCOUNTER — Encounter: Payer: Self-pay | Admitting: Internal Medicine

## 2014-09-25 ENCOUNTER — Encounter: Payer: No Typology Code available for payment source | Admitting: Gastroenterology

## 2014-09-25 ENCOUNTER — Telehealth: Payer: Self-pay | Admitting: Family Medicine

## 2014-09-25 ENCOUNTER — Other Ambulatory Visit: Payer: Self-pay | Admitting: Medical

## 2014-09-25 NOTE — Telephone Encounter (Signed)
Pt needs refill for Plavix 75 mg to [facility].  His last ov was 07/24/14

## 2014-09-26 ENCOUNTER — Other Ambulatory Visit: Payer: Self-pay | Admitting: Family Medicine

## 2014-09-26 MED ORDER — CLOPIDOGREL BISULFATE 75 MG PO TABS: 75.0000 mg | ORAL_TABLET | Freq: Every day | ORAL | Status: DC

## 2014-09-26 NOTE — Telephone Encounter (Signed)
Ok,make sure we sent refills on Plavix

## 2014-09-26 NOTE — Telephone Encounter (Signed)
Patient states that he he took Brilinta for only 30 days and then they had him start Plavix and that's the only thing he is currently taking right now

## 2014-09-26 NOTE — Telephone Encounter (Signed)
Verify is he on Brilinta or Plavix, shouldn't be on both.  We received refill request on Plavix.  Verify.

## 2014-09-27 ENCOUNTER — Ambulatory Visit: Payer: Medicaid Other | Admitting: Physician Assistant

## 2014-09-27 NOTE — Telephone Encounter (Signed)
I did. Rx taken care of

## 2014-09-30 IMAGING — US US ABDOMEN LIMITED
1 series · 14 of 25 positions shown · non-contrast
Comparison: None.

CLINICAL DATA: Hepatitis-C.  Surveillance for hepatoma.

EXAM:
US ABDOMEN LIMITED - RIGHT UPPER QUADRANT

[Series 1: us abdomen limited · 0.26mm/px · 14 of 54 slices shown]
[im 1/54]
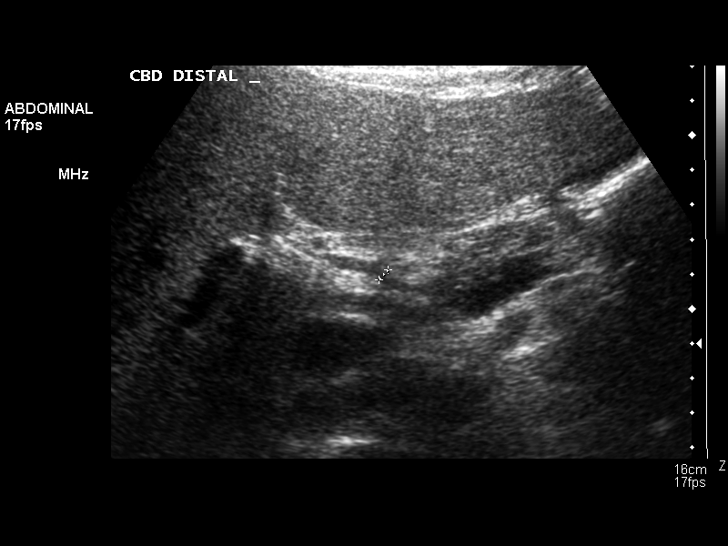
[im 5/54]
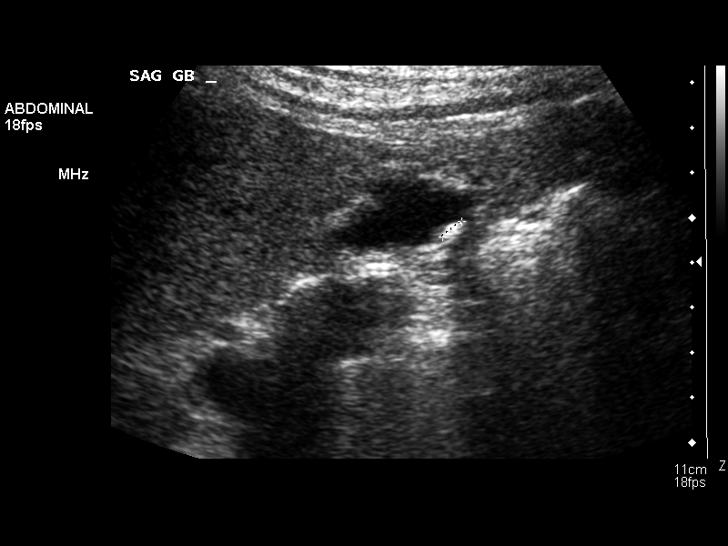
[im 9/54]
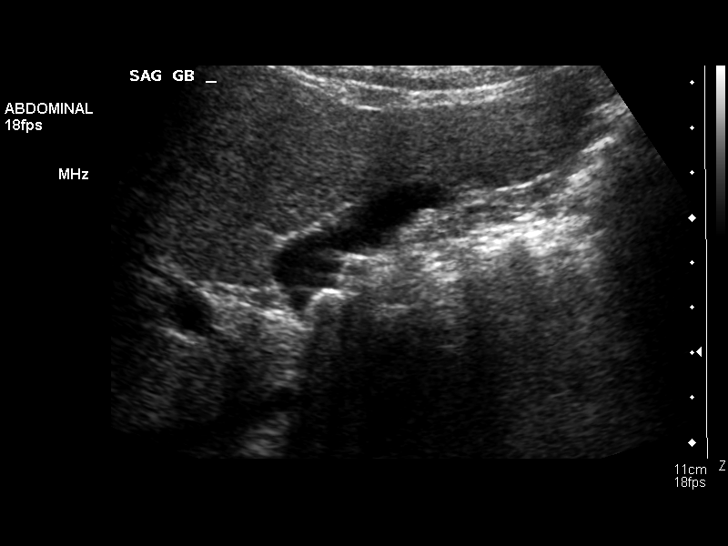
[im 14/54]
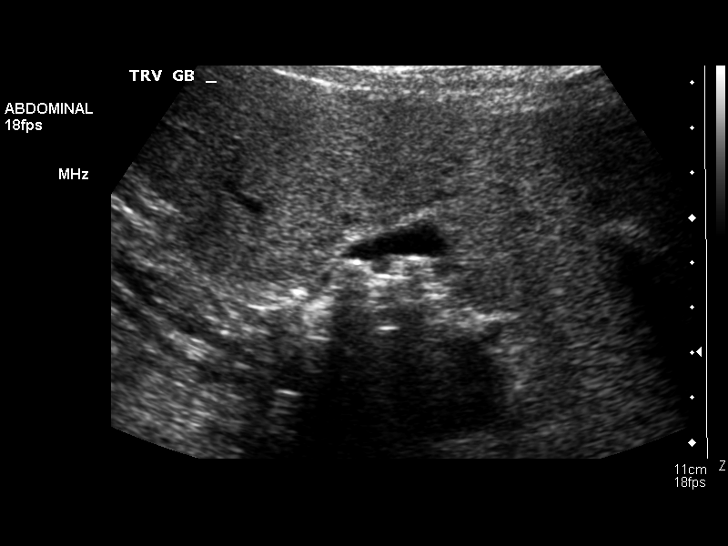
[im 18/54]
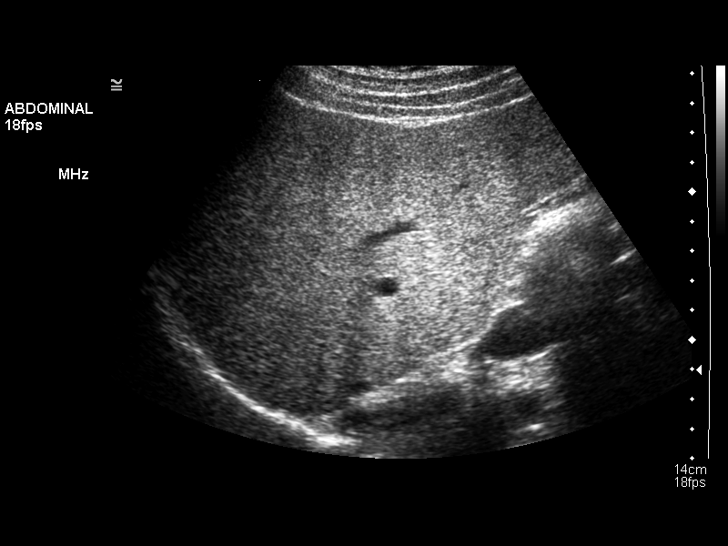
[im 20/54]
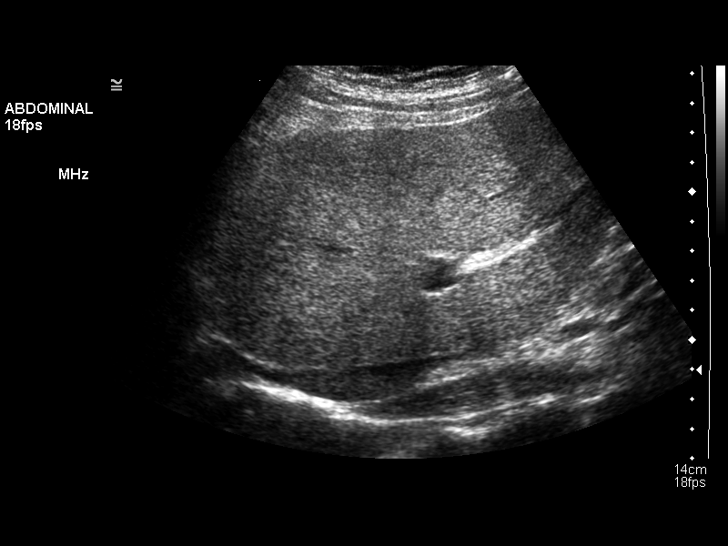
[im 25/54]
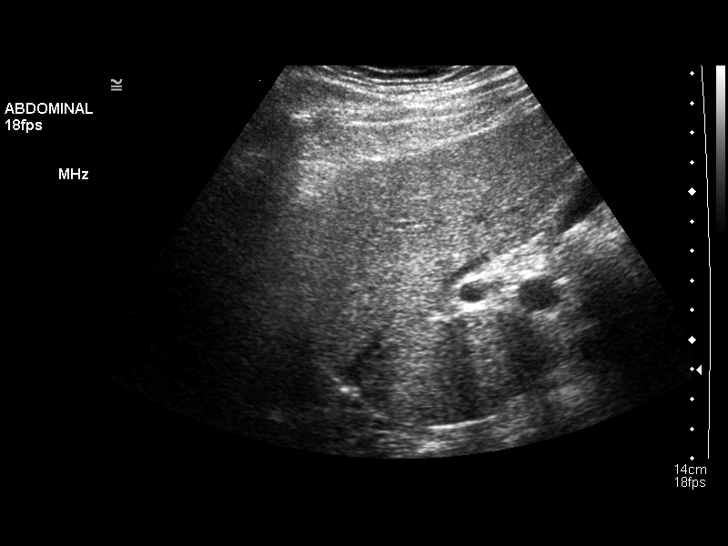
[im 29/54]
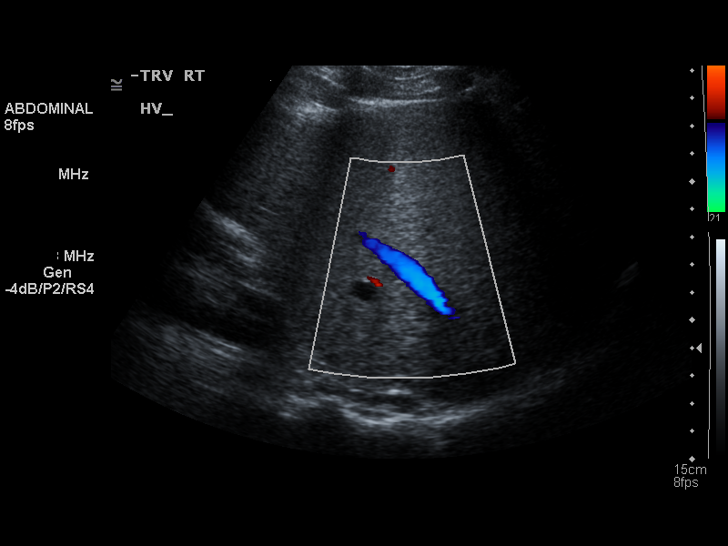
[im 34/54]
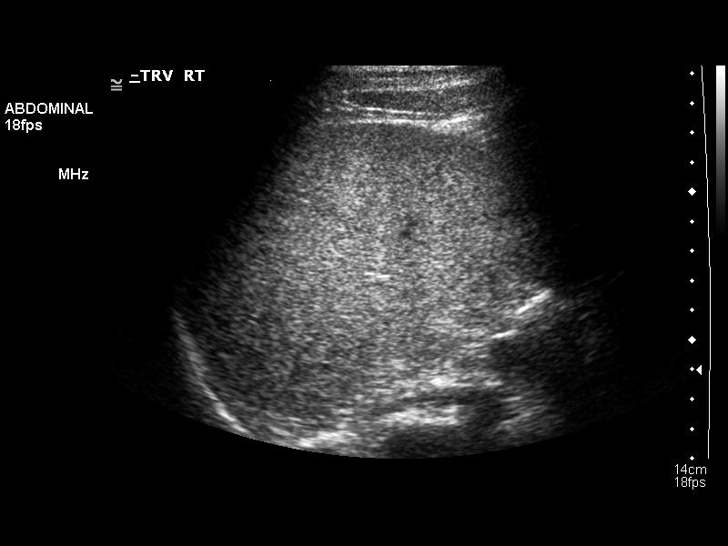
[im 36/54]
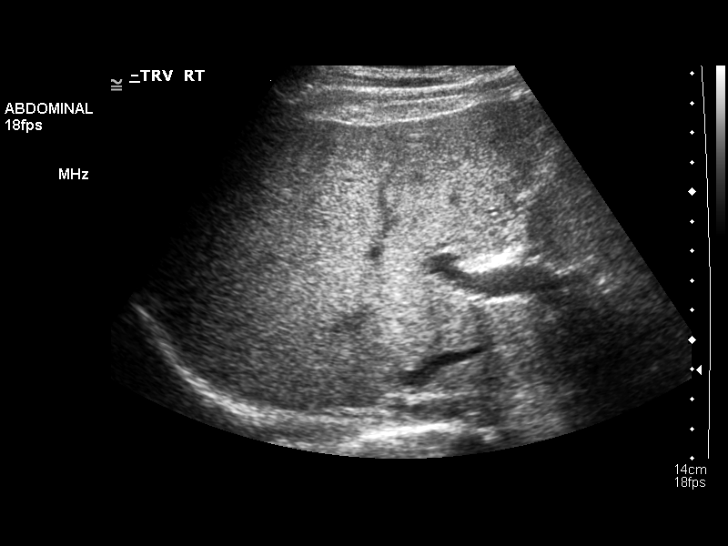
[im 40/54]
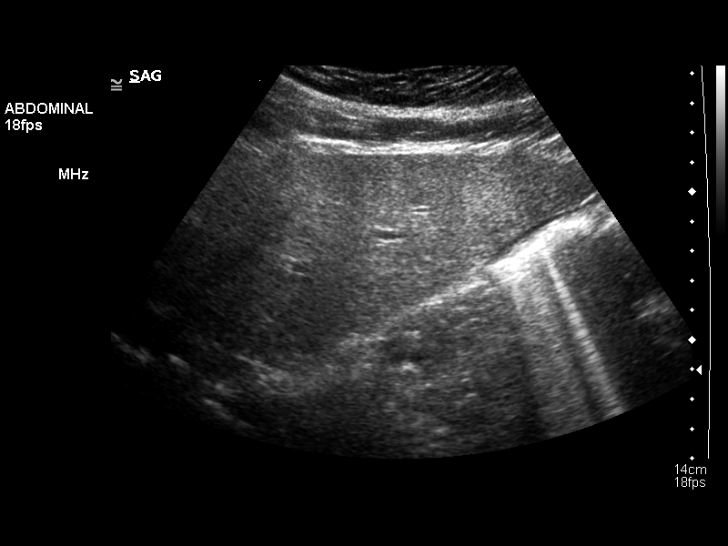
[im 45/54]
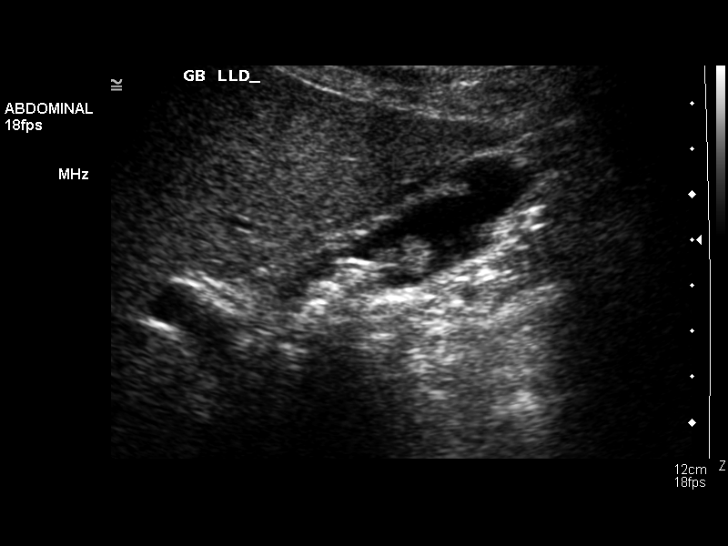
[im 49/54]
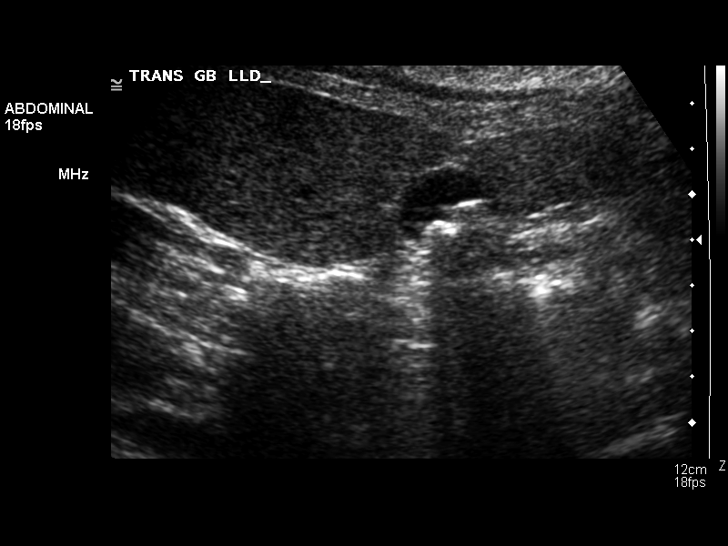
[im 54/54]
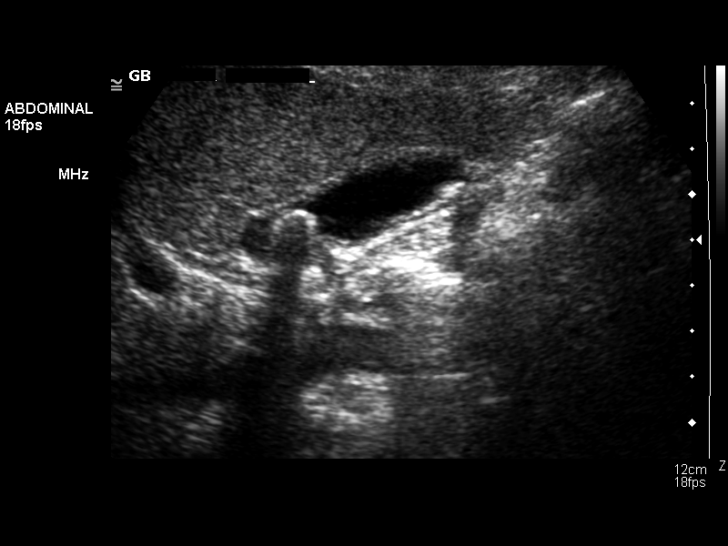

[14 of 25 positions shown; findings below may reference images not displayed]

FINDINGS: Gallbladder:

Multiple mobile gallstones within the gallbladder, the largest 10
mm. No wall thickening or pericholecystic fluid. Negative
sonographic Taxa.

Common bile duct:

Diameter: Normal caliber, 3 mm proximally. The distal common bile
duct cannot be visualized due to overlying bowel gas.

Liver:

Heterogeneous, increased echotexture throughout the liver suggesting
fatty infiltration or intrinsic liver disease. No focal abnormality.
No biliary duct dilatation.
IMPRESSION: Cholelithiasis.  No sonographic evidence of acute cholecystitis.

Heterogeneous echotexture throughout the liver compatible with fatty
infiltration or intrinsic liver disease. No focal abnormality.

## 2014-10-19 ENCOUNTER — Other Ambulatory Visit: Payer: Self-pay | Admitting: Medical

## 2014-10-31 ENCOUNTER — Other Ambulatory Visit: Payer: Self-pay | Admitting: Medical

## 2014-10-31 ENCOUNTER — Encounter: Payer: Self-pay | Admitting: Nurse Practitioner

## 2014-10-31 ENCOUNTER — Ambulatory Visit: Payer: Medicaid Other | Admitting: Cardiology

## 2014-11-07 ENCOUNTER — Other Ambulatory Visit: Payer: Self-pay | Admitting: Medical

## 2014-11-07 ENCOUNTER — Telehealth: Payer: Self-pay | Admitting: Medical

## 2014-11-07 MED ORDER — CARVEDILOL 25 MG PO TABS: 25.0000 mg | ORAL_TABLET | Freq: Two times a day (BID) | ORAL | Status: DC

## 2014-11-07 NOTE — Telephone Encounter (Signed)
Patient is aware of the message about his medication and to follow up here in september

## 2014-11-07 NOTE — Telephone Encounter (Signed)
Patient  Called needs refill on [employer]

## 2014-11-07 NOTE — Telephone Encounter (Signed)
Med sent, make f/u appt for september

## 2014-11-26 ENCOUNTER — Other Ambulatory Visit: Payer: Self-pay | Admitting: Medical

## 2014-12-13 ENCOUNTER — Telehealth: Payer: Self-pay | Admitting: Medical

## 2014-12-13 ENCOUNTER — Other Ambulatory Visit: Payer: Self-pay

## 2014-12-13 MED ORDER — CARVEDILOL 25 MG PO TABS: 25.0000 mg | ORAL_TABLET | Freq: Two times a day (BID) | ORAL | Status: DC

## 2014-12-13 NOTE — Telephone Encounter (Signed)
Requesting refill on Carvedilol 25mg 

## 2014-12-22 ENCOUNTER — Telehealth: Payer: Self-pay | Admitting: Medical

## 2014-12-22 ENCOUNTER — Other Ambulatory Visit: Payer: Self-pay | Admitting: Medical

## 2014-12-22 ENCOUNTER — Encounter: Payer: Self-pay | Admitting: Gastroenterology

## 2014-12-22 DIAGNOSIS — Z1211 Encounter for screening for malignant neoplasm of colon: Secondary | ICD-10-CM

## 2014-12-22 NOTE — Telephone Encounter (Signed)
Pt has an appt with [facility] but he would like to do the cologaurd instead as he will detect colon cancer. He just doesn't want to get the colonoscopy procedure done

## 2014-12-22 NOTE — Telephone Encounter (Signed)
I have put order in system

## 2014-12-22 NOTE — Telephone Encounter (Signed)
Pt called back and wants to have a coloquard said the GI told him to call us to schedule, pt can be reached at [phone]

## 2014-12-22 NOTE — Telephone Encounter (Signed)
This should be handled by assistant.

## 2014-12-22 NOTE — Telephone Encounter (Signed)
This is a little unusual. Which gastroenterologist did he see?  Why did they not want to do a colonoscopy?

## 2014-12-22 NOTE — Telephone Encounter (Signed)
Is this okay?

## 2014-12-22 NOTE — Telephone Encounter (Signed)
Pt called and wondering if he could get another referral to get a colonoscopy  he didn't get to go to the other because he had to go out of town, he can be reached at [phone] thank u

## 2014-12-25 NOTE — Telephone Encounter (Signed)
Faxed over info to [facility] for cologaurd.

## 2014-12-25 NOTE — Telephone Encounter (Signed)
He will need to call his insurer to check coverage for the Cologuard test as an option for colon cancer screening, as well as inquiring about copay/coinsurance for colonoscopy as well.  Some insurers don't cover Cologuard yet.   Out of pocket cash price is probably right around $600.

## 2014-12-25 NOTE — Telephone Encounter (Signed)
Pt will call his insurance and give [place] a call back and let us know which is cheaper

## 2014-12-25 NOTE — Telephone Encounter (Signed)
Pt called and said the insurance would cover the [place].

## 2015-01-09 ENCOUNTER — Ambulatory Visit: Payer: Self-pay | Admitting: Medical

## 2015-01-10 ENCOUNTER — Encounter: Payer: Self-pay | Admitting: Medical

## 2015-01-10 ENCOUNTER — Ambulatory Visit (INDEPENDENT_AMBULATORY_CARE_PROVIDER_SITE_OTHER): Payer: Medicaid Other | Admitting: Medical

## 2015-01-10 ENCOUNTER — Other Ambulatory Visit: Payer: Self-pay | Admitting: Medical

## 2015-01-10 VITALS — BP 126/88 | HR 66 | Temp 97.6°F | Resp 18 | Wt 203.6 lb

## 2015-01-10 DIAGNOSIS — Z23 Encounter for immunization: Secondary | ICD-10-CM

## 2015-01-10 DIAGNOSIS — E782 Mixed hyperlipidemia: Secondary | ICD-10-CM

## 2015-01-10 DIAGNOSIS — I10 Essential (primary) hypertension: Secondary | ICD-10-CM

## 2015-01-10 DIAGNOSIS — J988 Other specified respiratory disorders: Secondary | ICD-10-CM | POA: Diagnosis not present

## 2015-01-10 DIAGNOSIS — R7301 Impaired fasting glucose: Secondary | ICD-10-CM | POA: Diagnosis not present

## 2015-01-10 DIAGNOSIS — M7581 Other shoulder lesions, right shoulder: Secondary | ICD-10-CM | POA: Diagnosis not present

## 2015-01-10 LAB — COMPREHENSIVE METABOLIC PANEL
ALT: 16 U/L (ref 9–46)
AST: 26 U/L (ref 10–35)
Albumin: 4.2 g/dL (ref 3.6–5.1)
Alkaline Phosphatase: 94 U/L (ref 40–115)
BILIRUBIN TOTAL: 0.3 mg/dL (ref 0.2–1.2)
BUN: 8 mg/dL (ref 7–25)
CALCIUM: 9.3 mg/dL (ref 8.6–10.3)
CO2: 26 mmol/L (ref 20–31)
Chloride: 101 mmol/L (ref 98–110)
Creat: 0.98 mg/dL (ref 0.70–1.33)
GLUCOSE: 116 mg/dL — AB (ref 65–99)
Potassium: 4.3 mmol/L (ref 3.5–5.3)
Sodium: 141 mmol/L (ref 135–146)
Total Protein: 7 g/dL (ref 6.1–8.1)

## 2015-01-10 LAB — HM COLONOSCOPY: HM Colonoscopy: NEGATIVE

## 2015-01-10 MED ORDER — ISOSORBIDE MONONITRATE ER 30 MG PO TB24: 30.0000 mg | ORAL_TABLET | Freq: Every day | ORAL | Status: DC

## 2015-01-10 MED ORDER — AZITHROMYCIN 250 MG PO TABS: ORAL_TABLET | ORAL | Status: DC

## 2015-01-10 MED ORDER — ATORVASTATIN CALCIUM 20 MG PO TABS: 20.0000 mg | ORAL_TABLET | Freq: Every day | ORAL | Status: DC

## 2015-01-10 MED ORDER — BENZONATATE 200 MG PO CAPS: 200.0000 mg | ORAL_CAPSULE | Freq: Three times a day (TID) | ORAL | Status: DC | PRN

## 2015-01-10 MED ORDER — CLOPIDOGREL BISULFATE 75 MG PO TABS: 75.0000 mg | ORAL_TABLET | Freq: Every day | ORAL | Status: DC

## 2015-01-10 MED ORDER — LISINOPRIL-HYDROCHLOROTHIAZIDE 20-12.5 MG PO TABS: 1.0000 | ORAL_TABLET | Freq: Every day | ORAL | Status: DC

## 2015-01-10 MED ORDER — CARVEDILOL 25 MG PO TABS: 25.0000 mg | ORAL_TABLET | Freq: Two times a day (BID) | ORAL | Status: DC

## 2015-01-10 NOTE — Progress Notes (Signed)
Subjective: Chief Complaint  Patient presents with  . Follow-up    meds/cologuard results   Hypertension - compliant with Coreg and the Lisinopril HCT that was added from plain Lisinopril last visit.  He is exercising 20 min of walking most days.   Eating relative healthy.   No c/o of side effects with current medication.  Compliant with Lipitor without c/o   Still is remaining abstinent from tobacco use.  Has some recent right neck and chest muscle pain.  Does some lifting at home.  Not working.  He recently turned in the colo guard specimen.  No other aggravating or relieving factors.   Having 2 weeks of cough, congestion, head and chest congestion, clearing throat, feels like a cold that won't resolve.  No fever, no wheezing, no SOB.    No other complaint.  Past Medical History  Diagnosis Date  . CAD (coronary artery disease)     a. 06/2013 NSTEMI/Cath/PCI: LM nl, LAD 20-40d, Diags small with 95, LCX 20p, 99d (2.25x16 Promus Premier DES), OM1 80/90p(2.5x20 Promus Premier DES), OM2 min irregs, LPL1 small, min irregs, RCA/RPDA min irregs, RPL nl, EF 55%.  [name] HTN (hypertension)   . Hyperlipidemia   . Tobacco abuse     quit 06/2013  . Hepatitis C     s/p Harvoni therapy 2015  . Polysubstance abuse     history of cocaine, marijuana, tobacco abuse in the use   ROS as in subjective  Objective: BP 126/88 mmHg  Pulse 66  Temp(Src) 97.6 F (36.4 C) (Oral)  Resp 18  Wt 203 lb 9.6 oz (92.352 kg)  Gen: wd, wn, nad Oral cavity: MMM, no lesions HENT - unremarkable Neck: supple, no lymphadenopathy, no thyromegaly, no masses Heart: RRR, normal S1, S2, no murmurs Lungs: few rhonchi,no wheezing, no dullness, no rales Abdomen: +bs, soft, non tender, non distended, no masses, no hepatomegaly, no splenomegaly Pulses: 2+ symmetric, upper and lower extremities, normal cap refill    Assessment: Encounter Diagnoses  Name Primary?  . Essential hypertension Yes  . Impaired fasting blood  sugar   . Mixed dyslipidemia   . Pectoralis major tendinitis, right   . Need for prophylactic vaccination and inoculation against influenza   . Respiratory tract infection     Plan: HTN - controled on current medication.  Labs today.   discussed low salt diet, counseled on diet, exercise Impaired glucose - HgbA1C lab today Mixed dyslipidemia - adhere to low cholesterol diet, discussed diet, discussed increased duration of exercise more than 20 min daily Pectoralis tendonitis - advised relative rest, alternate ice/heat.   Counseled on the influenza virus vaccine.  Vaccine information sheet given.  Influenza vaccine given after consent obtained. Respiratory tract - begin round of Zpak and [place].  Rest, hydration discussed.  Call if not resolved in 1wk.  We will await colo guard results.   Jay Fuller was seen today for follow-up.  Diagnoses and all orders for this visit:  Essential hypertension -     Comprehensive metabolic panel  Impaired fasting blood sugar -     Comprehensive metabolic panel -     Hemoglobin A1c  Mixed dyslipidemia -     Comprehensive metabolic panel  Pectoralis major tendinitis, right  Need for prophylactic vaccination and inoculation against influenza  Respiratory tract infection  Other orders -     atorvastatin (LIPITOR) 20 MG tablet; Take 1 tablet (20 mg total) by mouth daily. -     carvedilol (COREG) 25  MG tablet; Take 1 tablet (25 mg total) by mouth 2 (two) times daily with a meal. -     clopidogrel (PLAVIX) 75 MG tablet; Take 1 tablet (75 mg total) by mouth daily. -     isosorbide mononitrate (IMDUR) 30 MG 24 hr tablet; Take 1 tablet (30 mg total) by mouth daily. -     lisinopril-hydrochlorothiazide (PRINZIDE,ZESTORETIC) 20-12.5 MG per tablet; Take 1 tablet by mouth daily. -     azithromycin (ZITHROMAX) 250 MG tablet; 2 tablets day 1, then 1 tablet days 2-4 -     benzonatate (TESSALON) 200 MG capsule; Take 1 capsule (200 mg total) by mouth 3  (three) times daily as needed for cough.

## 2015-01-11 LAB — HEMOGLOBIN A1C
Hgb A1c MFr Bld: 6.8 % — ABNORMAL HIGH (ref <5.7)
Mean Plasma Glucose: 148 mg/dL — ABNORMAL HIGH (ref <117)

## 2015-01-12 ENCOUNTER — Telehealth: Payer: Self-pay | Admitting: Medical

## 2015-01-12 NOTE — Telephone Encounter (Signed)
Pt.notified

## 2015-01-12 NOTE — Telephone Encounter (Signed)
pls call and let him know that the Cologuard screen was negative.  I would recommend either colonoscopy or Cologuard repeat in 3 years.

## 2015-01-16 ENCOUNTER — Encounter: Payer: Self-pay | Admitting: Medical

## 2015-01-22 NOTE — Addendum Note (Signed)
Addended by: Patience Musca F on: 01/22/2015 10:48 AM   Modules accepted: Orders

## 2015-01-25 ENCOUNTER — Encounter: Payer: Self-pay | Admitting: Medical

## 2015-02-13 ENCOUNTER — Encounter: Payer: Self-pay | Admitting: Gastroenterology

## 2015-02-15 ENCOUNTER — Ambulatory Visit: Payer: Medicaid Other | Admitting: Gastroenterology

## 2015-04-17 ENCOUNTER — Ambulatory Visit: Payer: Medicaid Other | Admitting: Gastroenterology

## 2015-04-21 ENCOUNTER — Other Ambulatory Visit: Payer: Self-pay | Admitting: Medical

## 2015-06-28 ENCOUNTER — Other Ambulatory Visit: Payer: Self-pay | Admitting: Nurse Practitioner

## 2015-06-28 DIAGNOSIS — K7460 Unspecified cirrhosis of liver: Secondary | ICD-10-CM

## 2015-07-01 ENCOUNTER — Emergency Department (HOSPITAL_COMMUNITY): Payer: Medicaid Other

## 2015-07-01 ENCOUNTER — Encounter (HOSPITAL_COMMUNITY): Payer: Self-pay | Admitting: Emergency Medicine

## 2015-07-01 ENCOUNTER — Emergency Department (HOSPITAL_COMMUNITY)
Admission: EM | Admit: 2015-07-01 | Discharge: 2015-07-01 | Disposition: A | Discharge status: Home / Self Care | Payer: Medicaid Other | Attending: Emergency Medicine | Admitting: Emergency Medicine

## 2015-07-01 DIAGNOSIS — Z9889 Other specified postprocedural states: Secondary | ICD-10-CM | POA: Insufficient documentation

## 2015-07-01 DIAGNOSIS — I2510 Atherosclerotic heart disease of native coronary artery without angina pectoris: Secondary | ICD-10-CM | POA: Insufficient documentation

## 2015-07-01 DIAGNOSIS — M549 Dorsalgia, unspecified: Secondary | ICD-10-CM | POA: Insufficient documentation

## 2015-07-01 DIAGNOSIS — Z87891 Personal history of nicotine dependence: Secondary | ICD-10-CM | POA: Insufficient documentation

## 2015-07-01 DIAGNOSIS — R079 Chest pain, unspecified: Secondary | ICD-10-CM | POA: Diagnosis present

## 2015-07-01 DIAGNOSIS — Z8619 Personal history of other infectious and parasitic diseases: Secondary | ICD-10-CM | POA: Diagnosis not present

## 2015-07-01 DIAGNOSIS — E785 Hyperlipidemia, unspecified: Secondary | ICD-10-CM | POA: Insufficient documentation

## 2015-07-01 DIAGNOSIS — Z7982 Long term (current) use of aspirin: Secondary | ICD-10-CM | POA: Diagnosis not present

## 2015-07-01 DIAGNOSIS — Z79899 Other long term (current) drug therapy: Secondary | ICD-10-CM | POA: Diagnosis not present

## 2015-07-01 DIAGNOSIS — R109 Unspecified abdominal pain: Secondary | ICD-10-CM | POA: Diagnosis not present

## 2015-07-01 DIAGNOSIS — R0781 Pleurodynia: Secondary | ICD-10-CM | POA: Diagnosis not present

## 2015-07-01 DIAGNOSIS — Z9861 Coronary angioplasty status: Secondary | ICD-10-CM | POA: Insufficient documentation

## 2015-07-01 DIAGNOSIS — I10 Essential (primary) hypertension: Secondary | ICD-10-CM | POA: Insufficient documentation

## 2015-07-01 LAB — URINE MICROSCOPIC-ADD ON: Bacteria, UA: NONE SEEN

## 2015-07-01 LAB — URINALYSIS, ROUTINE W REFLEX MICROSCOPIC
BILIRUBIN URINE: NEGATIVE
Glucose, UA: NEGATIVE mg/dL
Hgb urine dipstick: NEGATIVE
KETONES UR: 15 mg/dL — AB
Nitrite: NEGATIVE
Protein, ur: NEGATIVE mg/dL
SPECIFIC GRAVITY, URINE: 1.022 (ref 1.005–1.030)
pH: 5.5 (ref 5.0–8.0)

## 2015-07-01 MED ORDER — IBUPROFEN 800 MG PO TABS: 800.0000 mg | ORAL_TABLET | Freq: Three times a day (TID) | ORAL | Status: DC

## 2015-07-01 MED ORDER — HYDROCODONE-ACETAMINOPHEN 5-325 MG PO TABS
1.0000 | ORAL_TABLET | Freq: Once | ORAL | Status: AC
  Administered 2015-07-01: 1 via ORAL
  Filled 2015-07-01: qty 1

## 2015-07-01 NOTE — ED Notes (Signed)
Pt from home with c/o left lower back pain x 1 week, worse with coughing.  Pt denies any urinary symptoms.  NAD, A&O.

## 2015-07-01 NOTE — Discharge Instructions (Signed)
Schedule a follow up appointment with your PCP if your rib pain does not improve. Use ibuprofen prescribed or OTC tylenol for pain relief. Heat or ice applied to the area are other options for pain relief. Return to ED with new, worsening or concerning symptoms.    Chest Wall Pain Chest wall pain is pain in or around the bones and muscles of your chest. Sometimes, an injury causes this pain. Sometimes, the cause may not be known. This pain may take several weeks or longer to get better. HOME CARE INSTRUCTIONS  Pay attention to any changes in your symptoms. Take these actions to help with your pain:   Rest as told by your health care provider.   Avoid activities that cause pain. These include any activities that use your chest muscles or your abdominal and side muscles to lift heavy items.   If directed, apply ice to the painful area:  Put ice in a plastic bag.  Place a towel between your skin and the bag.  Leave the ice on for 20 minutes, 2-3 times per day.  Take over-the-counter and prescription medicines only as told by your health care provider.  Do not use tobacco products, including cigarettes, chewing tobacco, and e-cigarettes. If you need help quitting, ask your health care provider.  Keep all follow-up visits as told by your health care provider. This is important. SEEK MEDICAL CARE IF:  You have a fever.  Your chest pain becomes worse.  You have new symptoms. SEEK IMMEDIATE MEDICAL CARE IF:  You have nausea or vomiting.  You feel sweaty or light-headed.  You have a cough with phlegm (sputum) or you cough up blood.  You develop shortness of breath.   This information is not intended to replace advice given to you by your health care provider. Make sure you discuss any questions you have with your health care provider.   Document Released: 04/21/2005 Document Revised: 01/10/2015 Document Reviewed: 07/17/2014 Elsevier Interactive Patient Education 2016 [place].  Chest Pain Observation It is often hard to give a specific diagnosis for the cause of chest pain. Among other possibilities your symptoms might be caused by inadequate oxygen delivery to your heart (angina). Angina that is not treated or evaluated can lead to a heart attack (myocardial infarction) or death. Blood tests, electrocardiograms, and X-rays may have been done to help determine a possible cause of your chest pain. After evaluation and observation, your health care provider has determined that it is unlikely your pain was caused by an unstable condition that requires hospitalization. However, a full evaluation of your pain may need to be completed, with additional diagnostic testing as directed. It is very important to keep your follow-up appointments. Not keeping your follow-up appointments could result in permanent heart damage, disability, or death. If there is any problem keeping your follow-up appointments, you must call your health care provider. HOME CARE INSTRUCTIONS  Due to the slight chance that your pain could be angina, it is important to follow your health care provider's treatment plan and also maintain a healthy lifestyle:  Maintain or work toward achieving a healthy weight.  Stay physically active and exercise regularly.  Decrease your salt intake.  Eat a balanced, healthy diet. Talk to a dietitian to learn about heart-healthy foods.  Increase your fiber intake by including whole grains, vegetables, fruits, and nuts in your diet.  Avoid situations that cause stress, anger, or depression.  Take medicines as advised by your health care provider. Report  any side effects to your health care provider. Do not stop medicines or adjust the dosages on your own.  Quit smoking. Do not use nicotine patches or gum until you check with your health care provider.  Keep your blood pressure, blood sugar, and cholesterol levels within normal limits.  Limit alcohol intake to no  more than 1 drink per day for women who are not pregnant and 2 drinks per day for men.  Do not abuse drugs. SEEK IMMEDIATE MEDICAL CARE IF: You have severe chest pain or pressure which may include symptoms such as:  You feel pain or pressure in your arms, neck, jaw, or back.  You have severe back or abdominal pain, feel sick to your stomach (nauseous), or throw up (vomit).  You are sweating profusely.  You are having a fast or irregular heartbeat.  You feel short of breath while at rest.  You notice increasing shortness of breath during rest, sleep, or with activity.  You have chest pain that does not get better after rest or after taking your usual medicine.  You wake from sleep with chest pain.  You are unable to sleep because you cannot breathe.  You develop a frequent cough or you are coughing up blood.  You feel dizzy, faint, or experience extreme fatigue.  You develop severe weakness, dizziness, fainting, or chills. Any of these symptoms may represent a serious problem that is an emergency. Do not wait to see if the symptoms will go away. Call your local emergency services (911 in the [place] Do not drive yourself to the hospital. MAKE SURE YOU:  Understand these instructions.  Will watch your condition.  Will get help right away if you are not doing well or get worse.   This information is not intended to replace advice given to you by your health care provider. Make sure you discuss any questions you have with your health care provider.   Document Released: 05/24/2010 Document Revised: 04/26/2013 Document Reviewed: 10/21/2012 Elsevier Interactive Patient Education [employer].

## 2015-07-01 NOTE — ED Provider Notes (Signed)
CSN: [id number]     Arrival date & time 07/01/15  1001 History   First MD Initiated Contact with Patient 07/01/15 1110     Chief Complaint  Patient presents with  . Back Pain  . Flank Pain   HPI  Jay Fuller is a 60 year old male with PMHx of CAD, HTN, hepatitis C and polysubstance abuse presenting with left flank pain. Onset of symptoms was approximately 4-5 days ago. He states that his left flank and left lateral chest wall are sore. The pain is constant. The pain is relieved by resting and slow movements. This pain is exacerbated by coughing and palpation. He has not used any over-the-counter pain relievers. He denies recent trauma to the area. He does endorse a recent cold with productive coughing which has largely resolved in the past few days. Denies fevers, chills, headaches, nasal congestion, rhinorrhea, sore throat, chest pain, shortness of breath, current cough, abdominal pain, nausea, vomiting, dysuria, hematuria, increased urinary frequency, decreased urinary frequency, diarrhea, painful bowel movements or rashes.  Past Medical History  Diagnosis Date  . CAD (coronary artery disease)     a. 06/2013 NSTEMI/Cath/PCI: LM nl, LAD 20-40d, Diags small with 95, LCX 20p, 99d (2.25x16 Promus Premier DES), OM1 80/90p(2.5x20 Promus Premier DES), OM2 min irregs, LPL1 small, min irregs, RCA/RPDA min irregs, RPL nl, EF 55%.  [name] HTN (hypertension)   . Hyperlipidemia   . Tobacco abuse     quit 06/2013  . Hepatitis C     s/p Harvoni therapy 2015  . Polysubstance abuse     history of cocaine, marijuana, tobacco abuse in the use   Past Surgical History  Procedure Laterality Date  . Coronary angioplasty with stent placement  06/2013  . Left heart catheterization with coronary angiogram N/A 06/23/2013    Procedure: LEFT HEART CATHETERIZATION WITH CORONARY ANGIOGRAM;  Surgeon: Leonie Man, MD;  Location[facility] C[facility] LAB;  Service: Cardiovascular;  Laterality: N/A;   History reviewed. No pertinent  family history. Social History  Substance Use Topics  . Smoking status: Former Smoker -- 0.50 packs/day for 25 years  . Smokeless tobacco: Never Used  . Alcohol Use: 1.2 oz/week    2 Cans of beer per week     Comment: twice weekly    Review of Systems  Cardiovascular: Positive for chest pain.  Genitourinary: Positive for flank pain.  All other systems reviewed and are negative.     Allergies  Review of patient's allergies indicates no known allergies.  Home Medications   Prior to Admission medications   Medication Sig Start Date End Date Taking? Authorizing Provider  aspirin 81 MG chewable tablet Chew 1 tablet (81 mg total) by mouth daily. 07/24/14  Yes Camelia Eng Tysinger, PA-C  atorvastatin (LIPITOR) 20 MG tablet Take 1 tablet (20 mg total) by mouth daily. 01/10/15  Yes Camelia Eng Tysinger, PA-C  carvedilol (COREG) 25 MG tablet Take 1 tablet (25 mg total) by mouth 2 (two) times daily with a meal. 01/10/15  Yes Camelia Eng Tysinger, PA-C  clopidogrel (PLAVIX) 75 MG tablet Take 1 tablet (75 mg total) by mouth daily. 01/10/15  Yes Camelia Eng Tysinger, PA-C  diphenhydrAMINE (SOMINEX) 25 MG tablet Take 25 mg by mouth at bedtime as needed for sleep.   Yes Historical Provider, MD  isosorbide mononitrate (IMDUR) 30 MG 24 hr tablet Take 1 tablet (30 mg total) by mouth daily. 01/10/15  Yes Camelia Eng Tysinger, PA-C  lisinopril-hydrochlorothiazide (PRINZIDE,ZESTORETIC) 20-12.5 MG per tablet Take  1 tablet by mouth daily. 01/10/15  Yes Camelia Eng Tysinger, PA-C  nitroGLYCERIN (NITROSTAT) 0.4 MG SL tablet Place 1 tablet (0.4 mg total) under the tongue every 5 (five) minutes x 3 doses as needed for chest pain. 06/25/13  Yes Roger A Arguello, PA-C  ibuprofen (ADVIL,MOTRIN) 800 MG tablet Take 1 tablet (800 mg total) by mouth 3 (three) times daily. 07/01/15   Jay Bergeson, PA-C   BP 173/86 mmHg  Pulse 67  Temp(Src) 98.6 F (37 C) (Oral)  Resp 18  SpO2 100% Physical Exam  Constitutional: He appears well-developed and  well-nourished. No distress.  Nontoxic-appearing  HENT:  Head: Normocephalic and atraumatic.  Eyes: Conjunctivae are normal. Right eye exhibits no discharge. Left eye exhibits no discharge. No scleral icterus.  Neck: Normal range of motion.  Cardiovascular: Normal rate, regular rhythm and normal heart sounds.   Pulmonary/Chest: Effort normal and breath sounds normal. No respiratory distress. He has no wheezes. He has no rales.   He exhibits tenderness.  Breathing unlabored with equal chest expansion. Lungs clear to auscultation bilaterally. Moderate tenderness over the left lateral-  posterior chest wall as indicated in the diagram. No bony deformities noted.  Abdominal: Soft. There is no tenderness. There is no rebound and no guarding.  Abdomen is soft, nontender without peritoneal signs. No CVA tenderness. Tenderness is over left lateral and posterior chest wall.   Musculoskeletal: Normal range of motion.  Neurological: He is alert. Coordination normal.  Skin: Skin is warm and dry.  Psychiatric: He has a normal mood and affect. His behavior is normal.  Nursing note and vitals reviewed.   ED Course  Procedures (including critical care time) Labs Review Labs Reviewed  URINALYSIS, ROUTINE W REFLEX MICROSCOPIC (NOT AT [facility]) - Abnormal; Notable for the following:    Ketones, ur 15 (*)    Leukocytes, UA SMALL (*)    All other components within normal limits  URINE MICROSCOPIC-ADD ON - Abnormal; Notable for the following:    Squamous Epithelial / LPF 0-5 (*)    All other components within normal limits    Imaging Review Dg Chest 2 View  07/01/2015  CLINICAL DATA:  Productive cough for 1 week. EXAM: CHEST  2 VIEW COMPARISON:  06/22/2013 FINDINGS: The heart size and mediastinal contours are within normal limits. Both lungs are clear. The visualized skeletal structures are unremarkable. IMPRESSION: No active cardiopulmonary disease. Electronically Signed   By: Rolm Baptise M.D.   On:  07/01/2015 13:38   I have personally reviewed and evaluated these images and lab results as part of my medical decision-making.   EKG Interpretation None      MDM   Final diagnoses:  Rib pain on left side   60 year old male presenting with left lateral and posterior chest wall pain after recent URI with associated coughing. URI symptoms currently resolved. No associated symptoms with the chest wall pain. VSS. Pt is nontoxic appearing and in no acute distress. Heart RRR. Lungs CTAB. Tenderness to palpation of left lateral and posterior chest wall without underling bony abnormality. No overlying skin changes. Urinalysis without blood or signs of infection. CXR negative for acute disease. Pt's pain is musculoskeletal in nature. Possible costrochondritis vs occult rib fx from recent coughing. Pain treated in ED with vicodin. Will discharge pt home with symptomatic care including ibuprofen. Patient  Instructed to follow up with PCP Dr. Glade Lloyd if pain does not improve. Return precautions given in discharge paperwork and discussed with pt at bedside.  Pt stable for discharge  Case has been discussed with and seen by Dr. Roderic Palau who agrees with the above plan to discharge.     Josephina Gip, PA-C 07/01/15 1413  Milton Ferguson, MD 07/01/15 [phone]

## 2015-07-02 ENCOUNTER — Telehealth: Payer: Self-pay | Admitting: Medical

## 2015-07-02 DIAGNOSIS — Z01818 Encounter for other preprocedural examination: Secondary | ICD-10-CM

## 2015-07-02 NOTE — Telephone Encounter (Signed)
I received his recent liver care note.  Have him f/u here for phsyical appt.   Also make referral to GI as he needs updated endoscopy per the liver specialist.

## 2015-07-03 NOTE — Telephone Encounter (Signed)
cpe scheduled and pt is aware. Referral made

## 2015-07-09 ENCOUNTER — Other Ambulatory Visit: Payer: Medicaid Other

## 2015-07-16 ENCOUNTER — Encounter: Payer: Self-pay | Admitting: Medical

## 2015-07-16 ENCOUNTER — Telehealth: Payer: Self-pay

## 2015-07-16 NOTE — Telephone Encounter (Signed)
D

## 2015-07-16 NOTE — Telephone Encounter (Signed)
This patient no showed for their appointment today.Which of the following is necessary for this patient.   A) No follow-up necessary   B) Follow-up urgent. Locate Patient Immediately.   C) Follow-up necessary. Contact patient and Schedule visit in ____ Days.   D) Follow-up Advised. Contact patient and Schedule visit in ____ Days. 

## 2015-07-17 NOTE — Telephone Encounter (Signed)
To courtney for call

## 2015-07-17 NOTE — Telephone Encounter (Signed)
Stated he had a death in his family and rescheduled the physical

## 2015-07-20 ENCOUNTER — Encounter: Payer: Self-pay | Admitting: Medical

## 2015-07-25 ENCOUNTER — Telehealth: Payer: Self-pay

## 2015-07-25 ENCOUNTER — Encounter: Payer: Self-pay | Admitting: Medical

## 2015-07-25 ENCOUNTER — Ambulatory Visit (INDEPENDENT_AMBULATORY_CARE_PROVIDER_SITE_OTHER): Payer: Medicaid Other | Admitting: Medical

## 2015-07-25 VITALS — BP 150/90 | HR 70 | Ht 65.5 in | Wt 193.0 lb

## 2015-07-25 DIAGNOSIS — K7460 Unspecified cirrhosis of liver: Secondary | ICD-10-CM

## 2015-07-25 DIAGNOSIS — E782 Mixed hyperlipidemia: Secondary | ICD-10-CM

## 2015-07-25 DIAGNOSIS — I10 Essential (primary) hypertension: Secondary | ICD-10-CM

## 2015-07-25 DIAGNOSIS — R7301 Impaired fasting glucose: Secondary | ICD-10-CM

## 2015-07-25 DIAGNOSIS — Z0000 Encounter for general adult medical examination without abnormal findings: Secondary | ICD-10-CM

## 2015-07-25 DIAGNOSIS — Z87891 Personal history of nicotine dependence: Secondary | ICD-10-CM

## 2015-07-25 DIAGNOSIS — I252 Old myocardial infarction: Secondary | ICD-10-CM

## 2015-07-25 DIAGNOSIS — Z23 Encounter for immunization: Secondary | ICD-10-CM | POA: Diagnosis not present

## 2015-07-25 DIAGNOSIS — I2510 Atherosclerotic heart disease of native coronary artery without angina pectoris: Secondary | ICD-10-CM

## 2015-07-25 DIAGNOSIS — E785 Hyperlipidemia, unspecified: Secondary | ICD-10-CM

## 2015-07-25 DIAGNOSIS — B1920 Unspecified viral hepatitis C without hepatic coma: Secondary | ICD-10-CM

## 2015-07-25 LAB — BASIC METABOLIC PANEL
BUN: 13 mg/dL (ref 7–25)
CALCIUM: 9.6 mg/dL (ref 8.6–10.3)
CO2: 25 mmol/L (ref 20–31)
Chloride: 102 mmol/L (ref 98–110)
Creat: 1.13 mg/dL (ref 0.70–1.25)
Glucose, Bld: 137 mg/dL — ABNORMAL HIGH (ref 65–99)
POTASSIUM: 4 mmol/L (ref 3.5–5.3)
SODIUM: 139 mmol/L (ref 135–146)

## 2015-07-25 LAB — POCT URINALYSIS DIPSTICK
Bilirubin, UA: NEGATIVE
Glucose, UA: NEGATIVE
Leukocytes, UA: NEGATIVE
NITRITE UA: NEGATIVE
PH UA: 6
RBC UA: NEGATIVE
UROBILINOGEN UA: NEGATIVE

## 2015-07-25 LAB — LIPID PANEL
CHOLESTEROL: 132 mg/dL (ref 125–200)
HDL: 35 mg/dL — ABNORMAL LOW (ref >=40)
LDL Cholesterol: 49 mg/dL (ref <130)
Total CHOL/HDL Ratio: 3.8 Ratio (ref <=5.0)
Triglycerides: 239 mg/dL — ABNORMAL HIGH (ref <150)
VLDL: 48 mg/dL — ABNORMAL HIGH (ref <30)

## 2015-07-25 LAB — CBC
HCT: 43.2 % (ref 39.0–52.0)
HEMOGLOBIN: 14.9 g/dL (ref 13.0–17.0)
MCH: 29.4 pg (ref 26.0–34.0)
MCHC: 34.5 g/dL (ref 30.0–36.0)
MCV: 85.4 fL (ref 78.0–100.0)
MPV: 10.1 fL (ref 8.6–12.4)
Platelets: 184 10*3/uL (ref 150–400)
RBC: 5.06 MIL/uL (ref 4.22–5.81)
RDW: 14.9 % (ref 11.5–15.5)
WBC: 7.4 10*3/uL (ref 4.0–10.5)

## 2015-07-25 MED ORDER — ISOSORBIDE MONONITRATE ER 30 MG PO TB24: 30.0000 mg | ORAL_TABLET | Freq: Every day | ORAL | Status: DC

## 2015-07-25 MED ORDER — LISINOPRIL-HYDROCHLOROTHIAZIDE 20-12.5 MG PO TABS
1.0000 | ORAL_TABLET | Freq: Every day | ORAL | Status: DC
Start: 2015-07-25 — End: 2017-01-21

## 2015-07-25 MED ORDER — CLOPIDOGREL BISULFATE 75 MG PO TABS: 75.0000 mg | ORAL_TABLET | Freq: Every day | ORAL | Status: DC

## 2015-07-25 MED ORDER — ASPIRIN 81 MG PO CHEW: 81.0000 mg | CHEWABLE_TABLET | Freq: Every day | ORAL | Status: DC

## 2015-07-25 MED ORDER — CARVEDILOL 25 MG PO TABS: 25.0000 mg | ORAL_TABLET | Freq: Two times a day (BID) | ORAL | Status: DC

## 2015-07-25 MED ORDER — AMLODIPINE BESYLATE 5 MG PO TABS: 5.0000 mg | ORAL_TABLET | Freq: Every day | ORAL | Status: DC

## 2015-07-25 NOTE — Progress Notes (Signed)
Subjective:   HPI  Jay Fuller is a 60 y.o. male who presents for a complete physical.  Concerns: HTN - Compliant with Lisinopril HCT and amlodipine and coreg without c/o hyperlipemia - compliant with Lipitor and ASA without c/o CAD - hasn't seen cardiology in a while, compliant with Plavix, has NTG if needed, compliant with Imdur  Reviewed their medical, surgical, family, social, medication, and allergy history and updated chart as appropriate.  Past Medical History  Diagnosis Date  . CAD (coronary artery disease)     a. 06/2013 NSTEMI/Cath/PCI: LM nl, LAD 20-40d, Diags small with 95, LCX 20p, 99d (2.25x16 Promus Premier DES), OM1 80/90p(2.5x20 Promus Premier DES), OM2 min irregs, LPL1 small, min irregs, RCA/RPDA min irregs, RPL nl, EF 55%.  [name] HTN (hypertension)   . Hyperlipidemia   . Tobacco abuse     quit 06/2013.  has 12 pack year history  . Hepatitis C     s/p Harvoni therapy 2015  . Polysubstance abuse     history of cocaine, marijuana, tobacco abuse in the use  . Hepatitis B immune 2016  . Cirrhosis ([place])     due to Hep C  . Wears glasses   . Chronic pain     Past Surgical History  Procedure Laterality Date  . Coronary angioplasty with stent placement  06/2013  . Left heart catheterization with coronary angiogram N/A 06/23/2013    Procedure: LEFT HEART CATHETERIZATION WITH CORONARY ANGIOGRAM;  Surgeon: Leonie Man, MD;  Location: [facility] CATH LAB;  Service: Cardiovascular;  Laterality: N/A;  . Colonoscopy      denied, but Cologuard negative 12/2013  . Liver biopsy  2016    Social History   Social History  . Marital Status: Single    Spouse Name: N/A  . Number of Children: N/A  . Years of Education: N/A   Occupational History  . Not on file.   Social History Main Topics  . Smoking status: Former Smoker -- 0.50 packs/day for 25 years  . Smokeless tobacco: Never Used  . Alcohol Use: 1.2 oz/week    2 Cans of beer per week     Comment: twice weekly  . Drug  Use: Yes     Comment: uses cocaine and marijuana regularly.  [name] Sexual Activity: Yes   Other Topics Concern  . Not on file   Social History Narrative   His 10yo son lives with him, some exercise with walking, currently unemployed 07/2014    Family History  Problem Relation Age of Onset  . Hypertension Mother   . Cancer Sister   . Stroke Neg Hx   . Diabetes Neg Hx   . Hypertension Sister      Current outpatient prescriptions:  .  aspirin 81 MG chewable tablet, Chew 1 tablet (81 mg total) by mouth daily., Disp: 90 tablet, Rfl: 3 .  atorvastatin (LIPITOR) 20 MG tablet, Take 1 tablet (20 mg total) by mouth daily., Disp: 90 tablet, Rfl: 1 .  carvedilol (COREG) 25 MG tablet, Take 1 tablet (25 mg total) by mouth 2 (two) times daily with a meal., Disp: 180 tablet, Rfl: 1 .  clopidogrel (PLAVIX) 75 MG tablet, Take 1 tablet (75 mg total) by mouth daily., Disp: 90 tablet, Rfl: 1 .  diphenhydrAMINE (SOMINEX) 25 MG tablet, Take 25 mg by mouth at bedtime as needed for sleep., Disp: , Rfl:  .  isosorbide mononitrate (IMDUR) 30 MG 24 hr tablet, Take 1 tablet (30 mg total) by mouth  daily., Disp: 90 tablet, Rfl: 1 .  lisinopril-hydrochlorothiazide (PRINZIDE,ZESTORETIC) 20-12.5 MG tablet, Take 1 tablet by mouth daily., Disp: 90 tablet, Rfl: 3 .  amLODipine (NORVASC) 5 MG tablet, Take 1 tablet (5 mg total) by mouth daily., Disp: 90 tablet, Rfl: 1 .  ibuprofen (ADVIL,MOTRIN) 800 MG tablet, Take 1 tablet (800 mg total) by mouth 3 (three) times daily. (Patient not taking: Reported on 07/25/2015), Disp: 21 tablet, Rfl: 0 .  nitroGLYCERIN (NITROSTAT) 0.4 MG SL tablet, Place 1 tablet (0.4 mg total) under the tongue every 5 (five) minutes x 3 doses as needed for chest pain. (Patient not taking: Reported on 07/25/2015), Disp: 25 tablet, Rfl: 3  No Known Allergies  Review of Systems Constitutional: -fever, -chills, -sweats, -unexpected weight change, -decreased appetite, -fatigue Allergy: -sneezing, -itching,  -congestion Dermatology: -changing moles, --rash, -lumps ENT: -runny nose, -ear pain, -sore throat, -hoarseness, -sinus pain, -teeth pain, - ringing in ears, -hearing loss, -nosebleeds Cardiology: -chest pain, -palpitations, -swelling, -difficulty breathing when lying flat, -waking up short of breath Respiratory: -cough, -shortness of breath, -difficulty breathing with exercise or exertion, -wheezing, -coughing up blood Gastroenterology: -abdominal pain, -nausea, -vomiting, -diarrhea, -constipation, -blood in stool, -changes in bowel movement, -difficulty swallowing or eating Hematology: -bleeding, -bruising  Musculoskeletal: -joint aches, -muscle aches, -joint swelling, -back pain, -neck pain, -cramping, -changes in gait Ophthalmology: denies vision changes, eye redness, itching, discharge Urology: -burning with urination, -difficulty urinating, -blood in urine, -urinary frequency, -urgency, -incontinence Neurology: -headache, -weakness, -tingling, -numbness, -memory loss, -falls, -dizziness Psychology: -depressed mood, -agitation, -sleep problems     Objective:   Physical Exam  BP 150/90 mmHg  Pulse 70  Ht 5' 5.5" (1.664 m)  Wt 193 lb (87.544 kg)  BMI 31.62 kg/m2  General appearance: alert, no distress, WD/WN, AA male Skin: there are some linear scars on left antecubital space, few scattered macules, no worrisome lesions HEENT: normocephalic, conjunctiva/corneas normal, sclerae anicteric, PERRLA, EOMi, nares patent, no discharge or erythema, pharynx normal Oral cavity: MMM, tongue normal, teeth normal Neck: supple, no lymphadenopathy, no thyromegaly, no masses, normal ROM, no bruits Chest: non tender, normal shape and expansion Heart: RRR, normal S1, S2, no murmurs Lungs: CTA bilaterally, no wheezes, rhonchi, or rales Abdomen: +bs, soft, non tender, non distended, no masses, +mild hepatomegaly, no splenomegaly, no bruits Back: non tender, normal ROM, no scoliosis Musculoskeletal:  upper extremities non tender, no obvious deformity, normal ROM throughout, lower extremities non tender, no obvious deformity, normal ROM throughout Extremities: no edema, no cyanosis, no clubbing Pulses: 2+ symmetric, upper and lower extremities, normal cap refill Neurological: alert, oriented x 3, CN2-12 intact, strength normal upper extremities and lower extremities, sensation normal throughout, DTRs 2+ throughout, no cerebellar signs, gait normal Psychiatric: normal affect, behavior normal, pleasant  GU: normal male external genitalia, nontender, no masses, no hernia, no lymphadenopathy Rectal: anus normal tone, normal appearing, prostate WNL, no nodules, occult negative stool   Assessment and Plan :   Encounter Diagnoses  Name Primary?  . Routine general medical examination at a health care facility Yes  . Coronary artery disease involving native coronary artery of native heart without angina pectoris   . History of MI (myocardial infarction)   . Essential hypertension   . Hyperlipidemia   . Hepatitis C virus infection without hepatic coma, unspecified chronicity   . Mixed dyslipidemia   . Impaired fasting blood sugar   . Former smoker   . Cirrhosis of liver without ascites, unspecified hepatic cirrhosis type ([place])   . Need  for hepatitis A immunization    Physical exam - discussed healthy lifestyle, diet, exercise, preventative care, vaccinations, and addressed their concerns.   See your eye doctor yearly for routine vision care. See your dentist yearly for routine dental care including hygiene visits twice yearly. CAD - needs f/u with cardiology.  C/t imdur, risk factor control HTN - c/t Coreg 25mg  BID, Lisinopril 20/12.5mg  daily, increase to Amlodipine 10mg  daily Hyperlipidemia -c/t ASA and Lipitor QHS Hep C- Genotype 1 cured from [place] treatment in 2016.  Completed treated with Harvoni with great response, reviewed hepatitis clinic notes from 06/2015 visit Impaired glucose  -f/u pending labs, discussed diet, exercise, diagnosis criteria for diabetes Reviewed smoking hx/o and updated chart Cirrhosis of liver - discussed future complications to be aware of.  Counseled on the Hepatitis A virus vaccine.  Vaccine information sheet given.  Hepatitis A vaccine given after consent obtained. Referral to GI for EGD per recommendation by hepatitis clinic to rule out varices. Follow-up pending labs

## 2015-07-25 NOTE — Telephone Encounter (Signed)
This patient no showed for their appointment today.Which of the following is necessary for this patient.   A) No follow-up necessary   B) Follow-up urgent. Locate Patient Immediately.   C) Follow-up necessary. Contact patient and Schedule visit in ____ Days.   D) Follow-up Advised. Contact patient and Schedule visit in ____ Days. 

## 2015-07-26 ENCOUNTER — Encounter: Payer: Self-pay | Admitting: Medical

## 2015-07-26 LAB — HEMOGLOBIN A1C
HEMOGLOBIN A1C: 6.4 % — AB (ref <5.7)
MEAN PLASMA GLUCOSE: 137 mg/dL — AB (ref <117)

## 2015-07-26 LAB — MICROALBUMIN / CREATININE URINE RATIO
CREATININE, URINE: 260 mg/dL (ref 20–370)
MICROALB UR: 1.8 mg/dL
Microalb Creat Ratio: 7 mcg/mg creat (ref <30)

## 2015-07-26 MED ORDER — AMLODIPINE BESYLATE 10 MG PO TABS: 10.0000 mg | ORAL_TABLET | Freq: Every day | ORAL | Status: DC

## 2015-07-26 MED ORDER — ATORVASTATIN CALCIUM 20 MG PO TABS: 20.0000 mg | ORAL_TABLET | Freq: Every day | ORAL | Status: DC

## 2015-07-26 NOTE — Addendum Note (Signed)
Addended by: Billie Lade on: 07/26/2015 01:48 PM   Modules accepted: Orders, SmartSet

## 2015-08-03 ENCOUNTER — Encounter: Payer: Self-pay | Admitting: Internal Medicine

## 2015-08-14 ENCOUNTER — Other Ambulatory Visit: Payer: Self-pay | Admitting: Medical

## 2015-08-27 ENCOUNTER — Ambulatory Visit: Payer: Medicaid Other | Admitting: Dietician

## 2015-09-14 ENCOUNTER — Ambulatory Visit: Payer: Medicaid Other | Admitting: Dietician

## 2015-10-04 IMAGING — US US ABDOMEN LIMITED
1 series · 14 of 25 positions shown · non-contrast
Comparison: CT scan 11/01/2013 and ultrasound 02/07/2014

CLINICAL DATA: Cirrhosis.

EXAM:
US ABDOMEN LIMITED - RIGHT UPPER QUADRANT

[Series 1: us abdomen limited · 0.32mm/px · 14 of 55 slices shown]
[im 1/55]
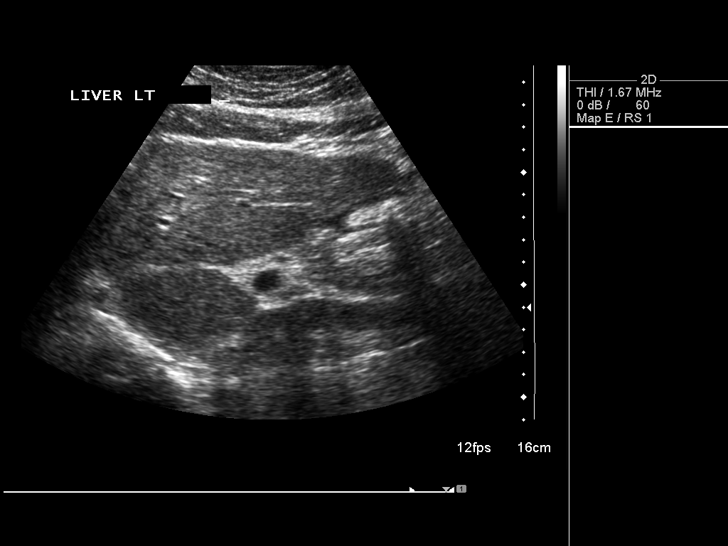
[im 5/55]
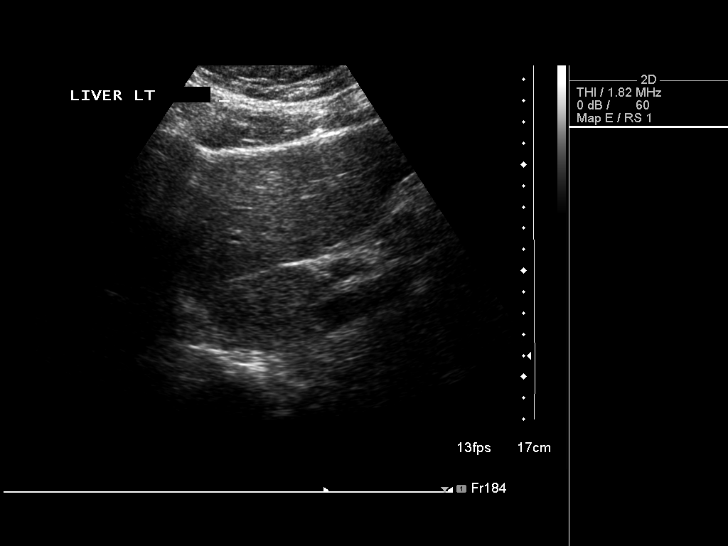
[im 10/55]
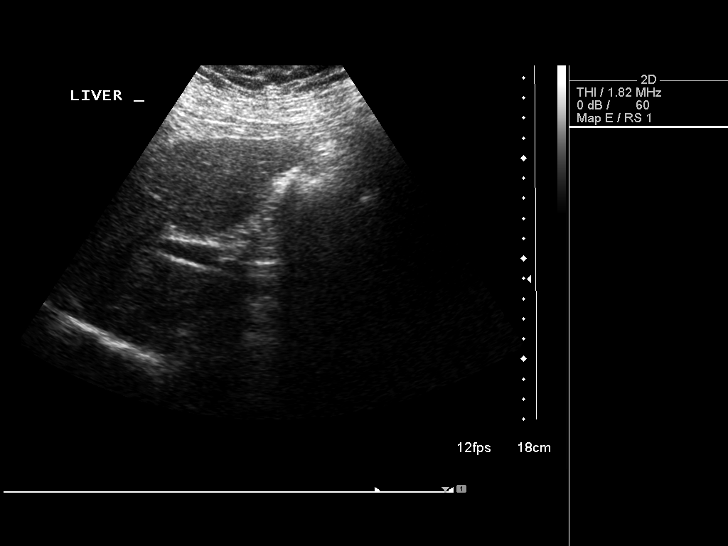
[im 14/55]
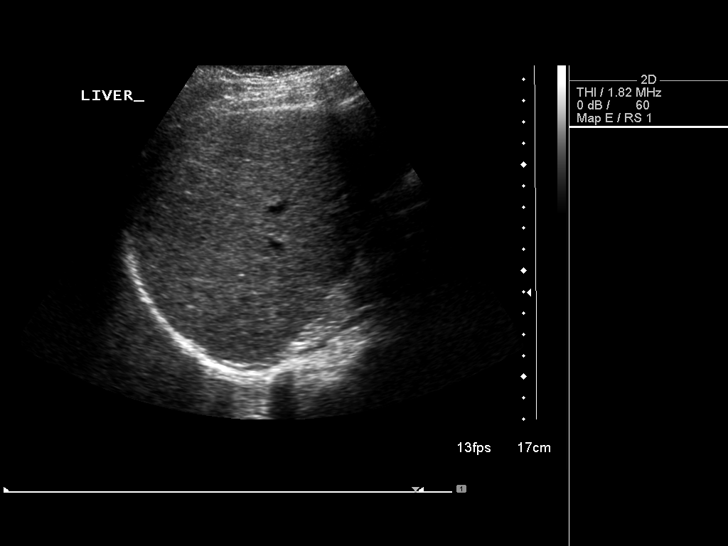
[im 19/55]
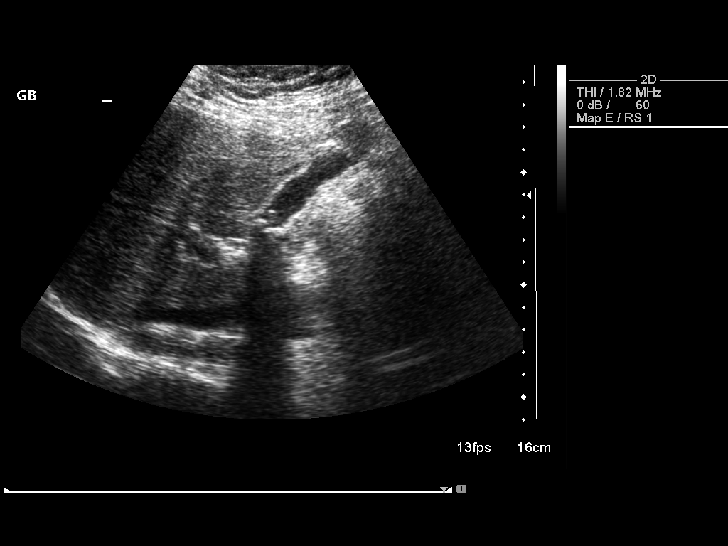
[im 21/55]
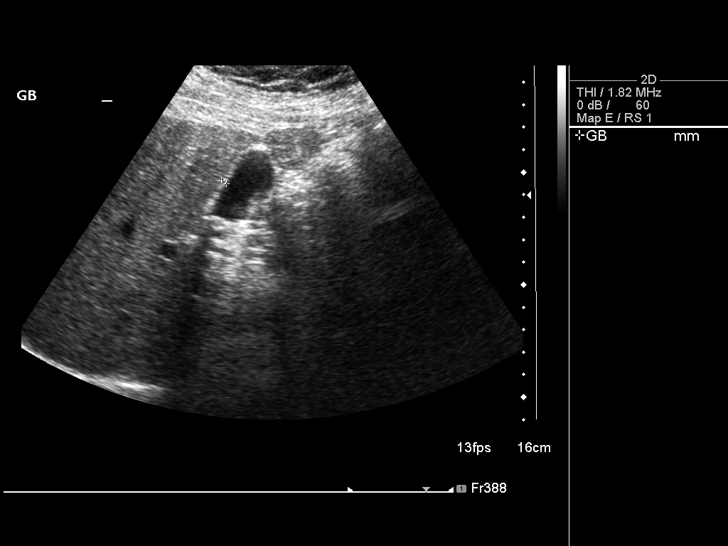
[im 25/55]
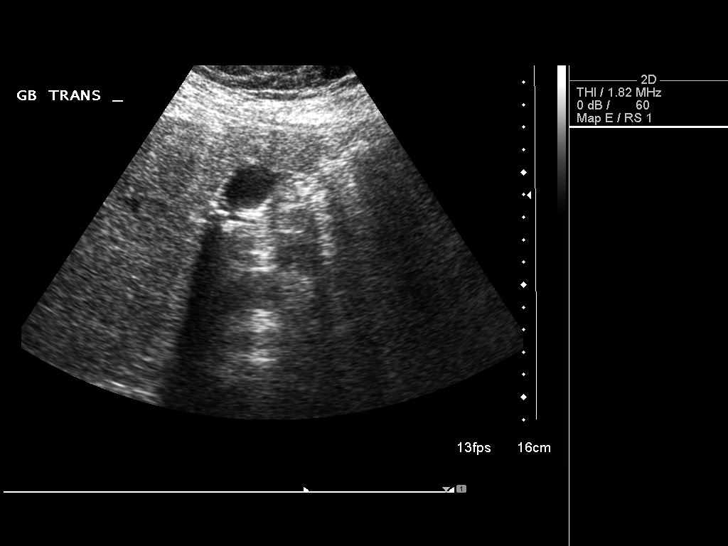
[im 30/55]
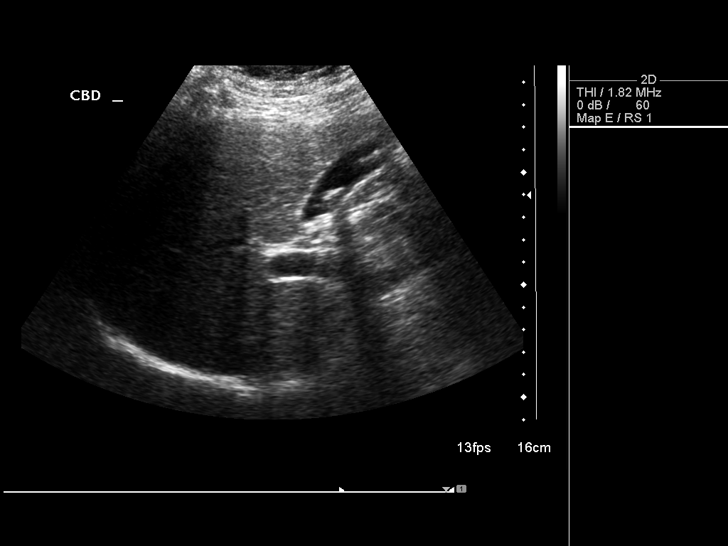
[im 34/55]
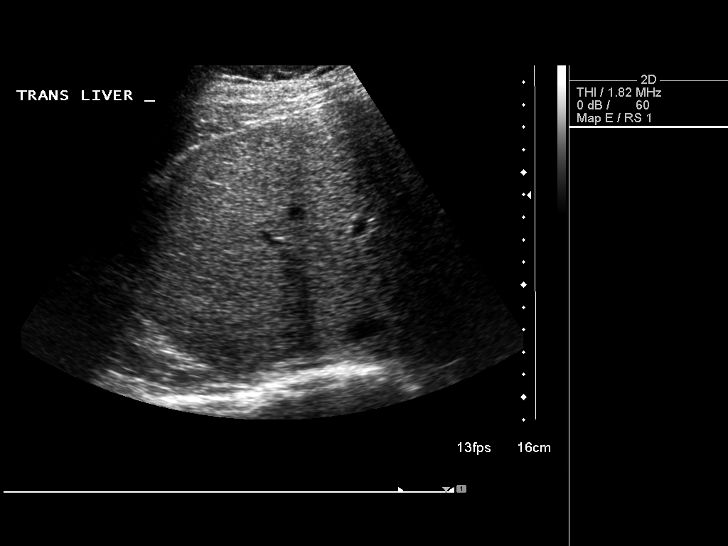
[im 37/55]
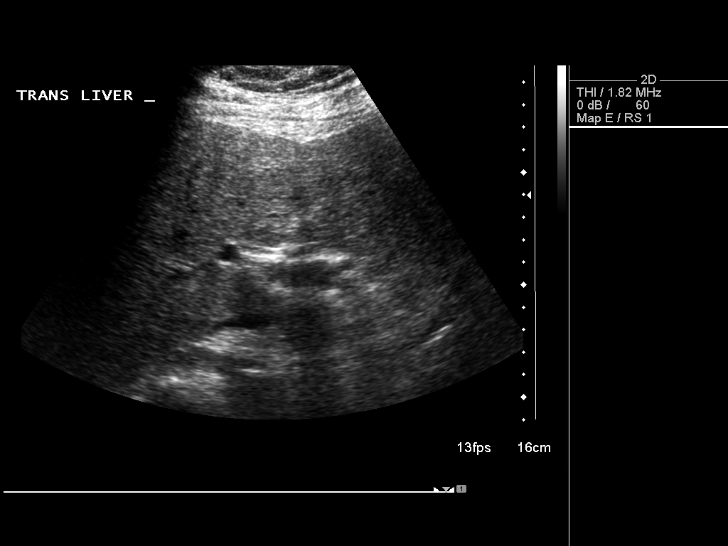
[im 41/55]
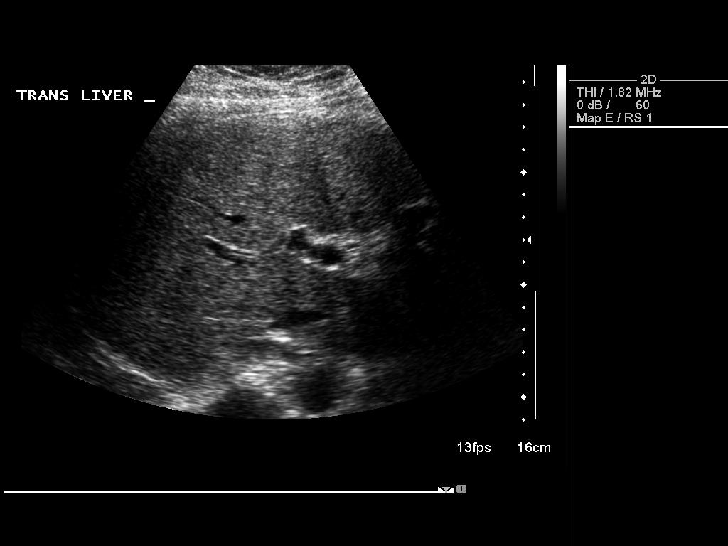
[im 46/55]
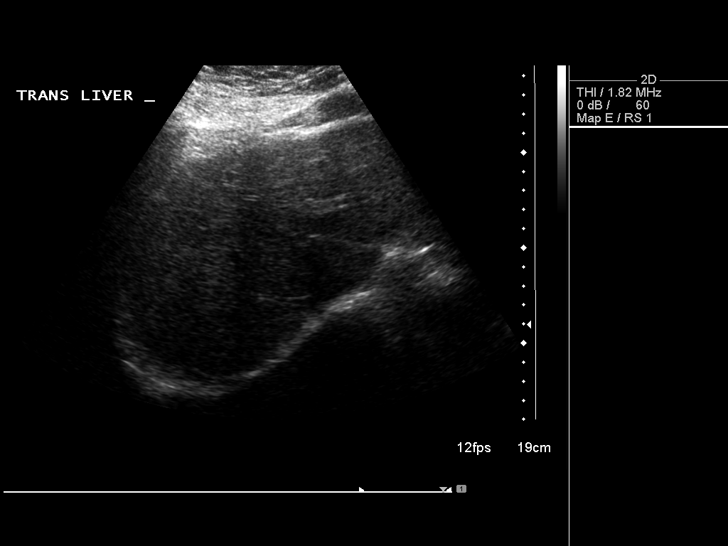
[im 50/55]
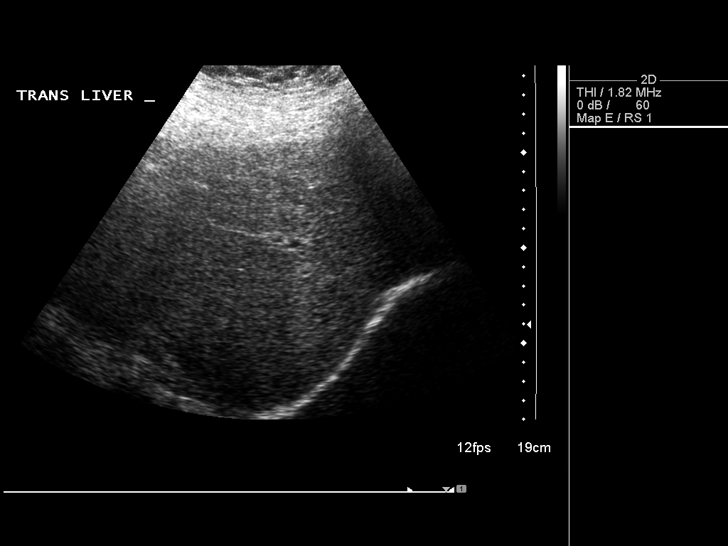
[im 55/55]
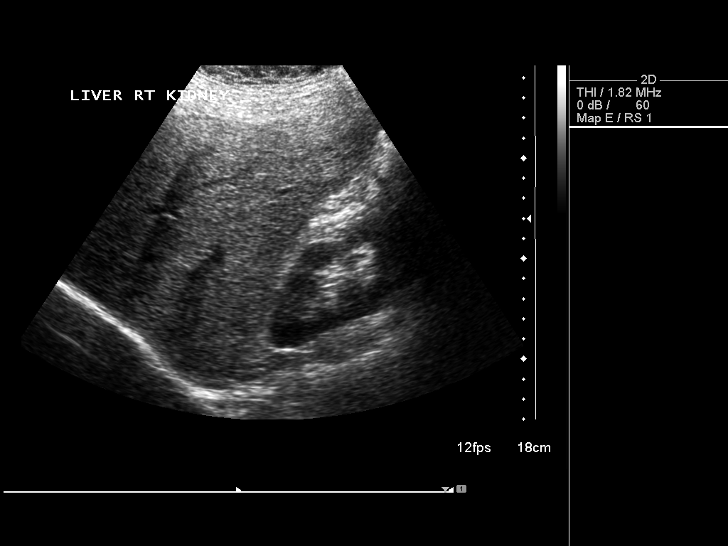

[14 of 25 positions shown; findings below may reference images not displayed]

FINDINGS: Gallbladder:

Numerous layering dependent echogenic shadowing gallstones. No
gallbladder wall thickening, pericholecystic fluid or sonographic
Murphy sign to suggest acute cholecystitis.

Common bile duct:

Diameter: 4.0 mm

Liver:

Irregular hepatic contour with coarse heterogeneous echogenicity
consistent with cirrhosis. No focal hepatic lesions or intrahepatic
biliary dilatation. No perihepatic ascites.
IMPRESSION: 1. Cholelithiasis without sonographic findings for acute
cholecystitis.
2. Normal caliber common bile duct.
3. Stable cirrhotic changes involving the liver without focal
hepatic lesion.

## 2015-12-03 ENCOUNTER — Other Ambulatory Visit: Payer: Self-pay | Admitting: Medical

## 2016-02-14 ENCOUNTER — Other Ambulatory Visit: Payer: Self-pay | Admitting: Medical

## 2016-02-14 NOTE — Telephone Encounter (Signed)
Refill but get him in as he was due back 50mo after last visit

## 2016-02-14 NOTE — Telephone Encounter (Signed)
Is this okay to refill? 

## 2016-03-11 ENCOUNTER — Emergency Department (HOSPITAL_COMMUNITY)
Admission: EM | Admit: 2016-03-11 | Discharge: 2016-03-11 | Disposition: A | Discharge status: Home / Self Care | Payer: Medicare Other | Attending: Emergency Medicine | Admitting: Emergency Medicine

## 2016-03-11 ENCOUNTER — Encounter (HOSPITAL_COMMUNITY): Payer: Self-pay | Admitting: *Deleted

## 2016-03-11 DIAGNOSIS — Z79899 Other long term (current) drug therapy: Secondary | ICD-10-CM | POA: Diagnosis not present

## 2016-03-11 DIAGNOSIS — Z87891 Personal history of nicotine dependence: Secondary | ICD-10-CM | POA: Insufficient documentation

## 2016-03-11 DIAGNOSIS — I10 Essential (primary) hypertension: Secondary | ICD-10-CM | POA: Diagnosis not present

## 2016-03-11 DIAGNOSIS — R21 Rash and other nonspecific skin eruption: Secondary | ICD-10-CM | POA: Diagnosis present

## 2016-03-11 DIAGNOSIS — Z955 Presence of coronary angioplasty implant and graft: Secondary | ICD-10-CM | POA: Insufficient documentation

## 2016-03-11 DIAGNOSIS — L309 Dermatitis, unspecified: Secondary | ICD-10-CM | POA: Diagnosis not present

## 2016-03-11 DIAGNOSIS — L259 Unspecified contact dermatitis, unspecified cause: Secondary | ICD-10-CM | POA: Diagnosis not present

## 2016-03-11 DIAGNOSIS — I252 Old myocardial infarction: Secondary | ICD-10-CM | POA: Insufficient documentation

## 2016-03-11 DIAGNOSIS — Z7982 Long term (current) use of aspirin: Secondary | ICD-10-CM | POA: Diagnosis not present

## 2016-03-11 DIAGNOSIS — I2510 Atherosclerotic heart disease of native coronary artery without angina pectoris: Secondary | ICD-10-CM | POA: Insufficient documentation

## 2016-03-11 MED ORDER — RANITIDINE HCL 150 MG PO TABS: 150.0000 mg | ORAL_TABLET | Freq: Two times a day (BID) | ORAL | 0 refills | Status: DC

## 2016-03-11 MED ORDER — PREDNISONE 10 MG PO TABS: 20.0000 mg | ORAL_TABLET | Freq: Every day | ORAL | 0 refills | Status: DC

## 2016-03-11 MED ORDER — CEPHALEXIN 500 MG PO CAPS: 500.0000 mg | ORAL_CAPSULE | Freq: Four times a day (QID) | ORAL | 0 refills | Status: DC

## 2016-03-11 NOTE — ED Provider Notes (Signed)
Patient seen and evaluated. Discussed with [facility]. The rash on his bilateral wrists. Almost circumferentially about his neck. Low back. None on his lower extremities. Some vesicles forming on the right wrist. The remainder appeared to be elevated plaques or urticarial. There is intensely pruritic. He has used new detergent. He does not feel ill. He has not had fever. Differential diagnosis would include contact dermatitis. May include impetigo however multi-distribution would be unlikely. Doubt viral pox. Plan prednisone, antihistamines, Keflex. Reevaluation if not improving.   Tanna Furry, MD 03/11/16 [phone]

## 2016-03-11 NOTE — ED Provider Notes (Signed)
[facility] DEPT Provider Note   CSN: [id number] Arrival date & time: 03/11/16  1657  By signing my name below, I, Reola Mosher, attest that this documentation has been prepared under the direction and in the presence of [facility], [place].  Electronically Signed: Reola Mosher, ED Scribe. 03/11/16. 5:18 PM.  History   Chief Complaint Chief Complaint  Patient presents with  . Rash   The history is provided by the patient. No language interpreter was used.  Rash   This is a new problem. The current episode started yesterday. The problem has been gradually worsening. The problem is associated with a new detergent/soap. There has been no fever. The rash is present on the neck, torso, back, left arm and right arm. The pain is at a severity of 0/10. The patient is experiencing no pain. Associated symptoms include blisters and itching. Pertinent negatives include no pain. He has tried nothing for the symptoms. The treatment provided no relief.    HPI Comments: Jay Fuller is a 60 y.o. male who presents to the Emergency Department complaining of gradually spreading, blistering and pruritic rash to his neck, bilateral arms, back, and chest onset yesterday. Pt notes that his rash initially began on the back of his neck and right hand and has spread since. Pt reports that prior to the onset of his rash that he did switch to a new laundry detergent. He notes associated mild SOB which began several hours ago and since his rash has began to spread to his anterior neck. No new soaps, lotions, foods, animals, plants, or medications otherwise. Pt notes that he recently bought a new mattress, and has not slept in any suspicious bedding recently. No recent travel. No noted treatments were tried prior to coming into the ED. No contact with similar symptoms or rashes. He denies wheezing, throat swelling, abdominal pain, fever, myalgias, or any other associated symptoms.   Past Medical History:   Diagnosis Date  . CAD (coronary artery disease)    a. 06/2013 NSTEMI/Cath/PCI: LM nl, LAD 20-40d, Diags small with 95, LCX 20p, 99d (2.25x16 Promus Premier DES), OM1 80/90p(2.5x20 Promus Premier DES), OM2 min irregs, LPL1 small, min irregs, RCA/RPDA min irregs, RPL nl, EF 55%.  . Chronic pain   . Cirrhosis ([place])    due to Hep C  . Hepatitis B immune 2016  . Hepatitis C    s/p Harvoni therapy 2015  . HTN (hypertension)   . Hyperlipidemia   . Polysubstance abuse    history of cocaine, marijuana, tobacco abuse in the use  . Tobacco abuse    quit 06/2013.  has 12 pack year history  . Wears glasses    Patient Active Problem List   Diagnosis Date Noted  . Need for hepatitis A immunization 07/25/2015  . History of MI (myocardial infarction) 07/25/2015  . Cirrhosis of liver without ascites ([place]) 07/25/2015  . Former smoker 07/25/2015  . Routine general medical examination at a health care facility 07/25/2015  . Impaired fasting blood sugar 01/10/2015  . Mixed dyslipidemia 01/10/2015  . NSTEMI (non-ST elevated myocardial infarction) ([place]) 06/25/2013  . Hepatitis C 06/25/2013  . CAD (coronary artery disease) 06/24/2013  . Hyperlipidemia 06/24/2013  . HTN (hypertension) 06/23/2013   Past Surgical History:  Procedure Laterality Date  . COLONOSCOPY     denied, but Cologuard negative 12/2013  . CORONARY ANGIOPLASTY WITH STENT PLACEMENT  06/2013  . LEFT HEART CATHETERIZATION WITH CORONARY ANGIOGRAM N/A 06/23/2013   Procedure: LEFT HEART  CATHETERIZATION WITH CORONARY ANGIOGRAM;  Surgeon: Leonie Man, MD;  Location: [facility] CATH LAB;  Service: Cardiovascular;  Laterality: N/A;  . LIVER BIOPSY  2016    Home Medications    Prior to Admission medications   Medication Sig Start Date End Date Taking? Authorizing Provider  amLODipine (NORVASC) 10 MG tablet Take 1 tablet (10 mg total) by mouth daily. 07/26/15   Camelia Eng Tysinger, PA-C  aspirin 81 MG chewable tablet Chew 1 tablet (81 mg total) by  mouth daily. 07/25/15   Camelia Eng Tysinger, PA-C  atorvastatin (LIPITOR) 20 MG tablet Take 1 tablet (20 mg total) by mouth daily. 07/26/15   Camelia Eng Tysinger, PA-C  carvedilol (COREG) 25 MG tablet Take 1 tablet (25 mg total) by mouth 2 (two) times daily with a meal. 07/25/15   Camelia Eng Tysinger, PA-C  carvedilol (COREG) 25 MG tablet TAKE 1 TABLET(25 MG) BY MOUTH TWICE DAILY WITH A MEAL 12/03/15   Camelia Eng Tysinger, PA-C  clopidogrel (PLAVIX) 75 MG tablet TAKE 1 TABLET BY MOUTH EVERY DAY 02/14/16   Camelia Eng Tysinger, PA-C  diphenhydrAMINE (SOMINEX) 25 MG tablet Take 25 mg by mouth at bedtime as needed for sleep.    Historical Provider, MD  ibuprofen (ADVIL,MOTRIN) 800 MG tablet Take 1 tablet (800 mg total) by mouth 3 (three) times daily. Patient not taking: Reported on 07/25/2015 07/01/15   Lahoma Crocker Barrett, PA-C  isosorbide mononitrate (IMDUR) 30 MG 24 hr tablet Take 1 tablet (30 mg total) by mouth daily. 07/25/15   Camelia Eng Tysinger, PA-C  lisinopril-hydrochlorothiazide (PRINZIDE,ZESTORETIC) 20-12.5 MG tablet Take 1 tablet by mouth daily. 07/25/15   Camelia Eng Tysinger, PA-C  lisinopril-hydrochlorothiazide (PRINZIDE,ZESTORETIC) 20-12.5 MG tablet TAKE 1 TABLET BY MOUTH DAILY 02/14/16   Camelia Eng Tysinger, PA-C  nitroGLYCERIN (NITROSTAT) 0.4 MG SL tablet Place 1 tablet (0.4 mg total) under the tongue every 5 (five) minutes x 3 doses as needed for chest pain. Patient not taking: Reported on 07/25/2015 06/25/13   Meriel Pica, PA-C   Family History Family History  Problem Relation Age of Onset  . Hypertension Mother   . Cancer Sister   . Hypertension Sister   . Stroke Neg Hx   . Diabetes Neg Hx    Social History Social History  Substance Use Topics  . Smoking status: Former Smoker    Packs/day: 0.50    Years: 25.00  . Smokeless tobacco: Never Used  . Alcohol use 1.2 oz/week    2 Cans of beer per week     Comment: twice weekly   Allergies   Patient has no known allergies.  Review of Systems Review of  Systems  Constitutional: Negative for fever.  HENT: Negative for sore throat and trouble swallowing.   Respiratory: Positive for shortness of breath. Negative for wheezing.   Gastrointestinal: Negative for abdominal pain.  Musculoskeletal: Negative for myalgias.  Skin: Positive for itching and rash.  All other systems reviewed and are negative.  Physical Exam Updated Vital Signs BP 135/71 (BP Location: Left Arm)   Pulse 64   Temp 97.6 F (36.4 C) (Oral)   Resp 18   SpO2 100%   Physical Exam  Constitutional: He appears well-developed and well-nourished.  HENT:  Head: Normocephalic and atraumatic.  Nose: Nose normal.  Mouth/Throat: Uvula is midline, oropharynx is clear and moist and mucous membranes are normal. No oropharyngeal exudate, posterior oropharyngeal edema or posterior oropharyngeal erythema. No tonsillar exudate.  Eyes: Conjunctivae and EOM are normal.  Neck:  Neck supple.  Cardiovascular: Normal rate and regular rhythm.   No murmur heard. Pulmonary/Chest: Effort normal and breath sounds normal.  Abdominal: He exhibits no distension.  Musculoskeletal: Normal range of motion.  Neurological: He is alert.  Skin: Skin is warm and dry. Rash noted. There is erythema.  Blister areas to the right forearm on the dorsum and the palmar aspect. Raised area with erythema to the mid-forearm. Similar areas to the back of the neck. Redness noted to the upper mid-chest. Hive like areas to the right, lower back that extends to the right side of the abdomen.   Psychiatric: He has a normal mood and affect. His behavior is normal.  Nursing note and vitals reviewed.  ED Treatments / Results  DIAGNOSTIC STUDIES: Oxygen Saturation is 100% on RA, normal by my interpretation.   COORDINATION OF CARE: 5:17 PM-Discussed next steps with pt. Pt verbalized understanding and is agreeable with the plan.   Labs (all labs ordered are listed, but only abnormal results are displayed) Labs Reviewed  - No data to display Radiology No results found.  Procedures Procedures   Medications Ordered in ED Medications - No data to display  Initial Impression / Assessment and Plan / ED Course  I have reviewed the triage vital signs and the nursing notes.  Clinical Course    Patient with rash most like due to contact.  Instructed to avoid offending agent and to use unscented soaps, lotions, and detergents. Will treat with rx's of Keflex, a burst of Prednisone, Zantac, and oral benadryl. Follow up with PCP in 2-3 days. Pt is comfortable with above plan and is stable for discharge at this time. All questions were answered prior to disposition. Strict return precautions for f/u into the ED were discussed.   Final Clinical Impressions(s) / ED Diagnoses   Final diagnoses:  Dermatitis   New Prescriptions New Prescriptions   CEPHALEXIN (KEFLEX) 500 MG CAPSULE    Take 1 capsule (500 mg total) by mouth 4 (four) times daily.   PREDNISONE (DELTASONE) 10 MG TABLET    Take 2 tablets (20 mg total) by mouth daily.   RANITIDINE (ZANTAC) 150 MG TABLET    Take 1 tablet (150 mg total) by mouth 2 (two) times daily.   I personally performed the services described in this documentation, which was scribed in my presence. The recorded information has been reviewed and is accurate.     [place], NP 03/19/16 2212    Tanna Furry, MD 04/01/16 [phone]

## 2016-03-11 NOTE — ED Notes (Signed)
Declined W/C at D/C and was escorted to lobby by RN. 

## 2016-03-11 NOTE — ED Triage Notes (Signed)
Pt reports rash and blisters to arms, abdomen and back of neck.

## 2016-03-18 ENCOUNTER — Other Ambulatory Visit: Payer: Self-pay | Admitting: Medical

## 2016-03-19 ENCOUNTER — Other Ambulatory Visit: Payer: Self-pay

## 2016-04-08 ENCOUNTER — Encounter: Payer: Self-pay | Admitting: Medical

## 2016-04-08 ENCOUNTER — Ambulatory Visit (INDEPENDENT_AMBULATORY_CARE_PROVIDER_SITE_OTHER): Payer: Medicare Other | Admitting: Medical

## 2016-04-08 VITALS — BP 136/80 | HR 86 | Wt 190.0 lb

## 2016-04-08 DIAGNOSIS — R21 Rash and other nonspecific skin eruption: Secondary | ICD-10-CM

## 2016-04-08 DIAGNOSIS — W57XXXD Bitten or stung by nonvenomous insect and other nonvenomous arthropods, subsequent encounter: Secondary | ICD-10-CM

## 2016-04-08 DIAGNOSIS — I2510 Atherosclerotic heart disease of native coronary artery without angina pectoris: Secondary | ICD-10-CM | POA: Diagnosis not present

## 2016-04-08 MED ORDER — TRIAMCINOLONE ACETONIDE 0.1 % EX CREA: 1.0000 "application " | TOPICAL_CREAM | Freq: Two times a day (BID) | CUTANEOUS | 0 refills | Status: DC

## 2016-04-08 NOTE — Progress Notes (Signed)
Subjective:   Jay Fuller is a 60 y.o. male who presents for evaluation of a rash involving right posterior arm, bilat anterior lower legs.  Had similar rash few weeks ago, was seen in the ED.  Was given prednisone, keflex, ranitidine, felt it was contact dermatitis.  He did fine for a week or so then now has seen new spots the last 2-3 days.  Girlfriend has similar on her lower legs.  She stays at his house 2 times per week. He lives with his 3 children.  Been with girlfriend 15 years.  His kids got a new cat recently, but it mostly stays outside.  No other new exposures, no new medications.  No other aggravating or relieving factors. No other complaint.  The following portions of the patient's history were reviewed and updated as appropriate: allergies, current medications, past family history, past medical history, past social history and problem list.  Review of Systems As in subjective above  Past Medical History:  Diagnosis Date  . CAD (coronary artery disease)    a. 06/2013 NSTEMI/Cath/PCI: LM nl, LAD 20-40d, Diags small with 95, LCX 20p, 99d (2.25x16 Promus Premier DES), OM1 80/90p(2.5x20 Promus Premier DES), OM2 min irregs, LPL1 small, min irregs, RCA/RPDA min irregs, RPL nl, EF 55%.  . Chronic pain   . 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 Hepatitis B immune 2016  . Hepatitis C    s/p Harvoni therapy 2015  . HTN (hypertension)   . Hyperlipidemia   . Polysubstance abuse    history of cocaine, marijuana, tobacco abuse in the use  . Tobacco abuse    quit 06/2013.  has 12 pack year history  . Wears glasses    Current Outpatient Prescriptions on File Prior to Visit  Medication Sig Dispense Refill  . amLODipine (NORVASC) 10 MG tablet Take 1 tablet (10 mg total) by mouth daily. 90 tablet 3  . aspirin 81 MG chewable tablet Chew 1 tablet (81 mg total) by mouth daily. 90 tablet 3  . atorvastatin (LIPITOR) 20 MG tablet TAKE 1 TABLET(20 MG) BY MOUTH DAILY 90 tablet 0  . carvedilol  (COREG) 25 MG tablet Take 1 tablet (25 mg total) by mouth 2 (two) times daily with a meal. 180 tablet 1  . carvedilol (COREG) 25 MG tablet TAKE 1 TABLET(25 MG) BY MOUTH TWICE DAILY WITH A MEAL 180 tablet 1  . cephALEXin (KEFLEX) 500 MG capsule Take 1 capsule (500 mg total) by mouth 4 (four) times daily. 20 capsule 0  . clopidogrel (PLAVIX) 75 MG tablet TAKE 1 TABLET BY MOUTH EVERY DAY 90 tablet 0  . diphenhydrAMINE (SOMINEX) 25 MG tablet Take 25 mg by mouth at bedtime as needed for sleep.    [name] ibuprofen (ADVIL,MOTRIN) 800 MG tablet Take 1 tablet (800 mg total) by mouth 3 (three) times daily. 21 tablet 0  . isosorbide mononitrate (IMDUR) 30 MG 24 hr tablet Take 1 tablet (30 mg total) by mouth daily. 90 tablet 1  . lisinopril-hydrochlorothiazide (PRINZIDE,ZESTORETIC) 20-12.5 MG tablet Take 1 tablet by mouth daily. 90 tablet 3  . lisinopril-hydrochlorothiazide (PRINZIDE,ZESTORETIC) 20-12.5 MG tablet TAKE 1 TABLET BY MOUTH DAILY 90 tablet 0  . nitroGLYCERIN (NITROSTAT) 0.4 MG SL tablet Place 1 tablet (0.4 mg total) under the tongue every 5 (five) minutes x 3 doses as needed for chest pain. 25 tablet 3  . predniSONE (DELTASONE) 10 MG tablet Take 2 tablets (20 mg total) by mouth daily. 16 tablet 0  .  ranitidine (ZANTAC) 150 MG tablet Take 1 tablet (150 mg total) by mouth 2 (two) times daily. 60 tablet 0   No current facility-administered medications on file prior to visit.     ROS as in subjective   Objective:   Gen: wd, wn, nad Skin: right posterior upper arm with 2 round raised erythematous lesions naproxen 2.5cm diameter suggestive of inset bites, left lower leg/anterior ankle with similar erythematous lesion.   He has a few other scattered small 2-21mm nonspecific erythematous rash, flat on bilat anterior lower legs.   No other rash Oral:MMM, no lesions, no swelling Lungs clear   Assessment:   Encounter Diagnoses  Name Primary?  . Insect bite, subsequent encounter Yes  . Rash and  nonspecific skin eruption     Plan:   Discussed symptoms and exam findings.  Etiology most likely flea or other insect bite.  Begin OTC antihistamine, good hygiene with soap and water.  Begin triamcinolone cream.   discussed house cleaning, checking the cat for fleas, treating this appropriately.  F/u prn.  Jay Fuller was seen today for rash.  Diagnoses and all orders for this visit:  Insect bite, subsequent encounter  Rash and nonspecific skin eruption  Other orders -     triamcinolone cream (KENALOG) 0.1 %; Apply 1 application topically 2 (two) times daily.

## 2016-05-12 ENCOUNTER — Other Ambulatory Visit: Payer: Self-pay | Admitting: Medical

## 2016-05-12 NOTE — Telephone Encounter (Signed)
Is this okay to refill? 

## 2016-06-20 ENCOUNTER — Other Ambulatory Visit: Payer: Self-pay | Admitting: Medical

## 2016-07-28 ENCOUNTER — Ambulatory Visit: Payer: Medicare Other | Admitting: Medical

## 2016-08-12 ENCOUNTER — Other Ambulatory Visit: Payer: Self-pay | Admitting: Medical

## 2016-08-15 ENCOUNTER — Ambulatory Visit: Payer: Medicare Other | Admitting: Medical

## 2016-08-27 ENCOUNTER — Ambulatory Visit: Payer: Medicare Other | Admitting: Medical

## 2016-08-27 ENCOUNTER — Telehealth: Payer: Self-pay

## 2016-08-27 NOTE — Telephone Encounter (Signed)
D

## 2016-08-27 NOTE — Telephone Encounter (Signed)

## 2016-08-27 NOTE — Telephone Encounter (Signed)
Forwarding to [employer]

## 2016-09-05 ENCOUNTER — Encounter: Payer: Self-pay | Admitting: Medical

## 2016-09-05 NOTE — Telephone Encounter (Signed)
Letter sent.

## 2016-09-11 ENCOUNTER — Other Ambulatory Visit: Payer: Self-pay | Admitting: Medical

## 2016-09-12 NOTE — Telephone Encounter (Signed)
Called andl/m for pt to call us back. 

## 2016-09-12 NOTE — Telephone Encounter (Signed)
Refill and get him in for physical

## 2016-09-24 ENCOUNTER — Other Ambulatory Visit: Payer: Self-pay | Admitting: Medical

## 2016-10-08 ENCOUNTER — Telehealth: Payer: Self-pay

## 2016-10-08 ENCOUNTER — Ambulatory Visit: Payer: Medicare Other | Admitting: Medical

## 2016-10-08 NOTE — Telephone Encounter (Signed)
D

## 2016-10-08 NOTE — Telephone Encounter (Signed)

## 2016-10-10 NOTE — Telephone Encounter (Signed)
Multiple no shows to be considered for dismissal

## 2016-10-27 ENCOUNTER — Encounter: Payer: Self-pay | Admitting: Family Medicine

## 2016-11-20 ENCOUNTER — Other Ambulatory Visit: Payer: Self-pay | Admitting: Medical

## 2016-12-03 ENCOUNTER — Telehealth: Payer: Self-pay | Admitting: Family Medicine

## 2016-12-03 ENCOUNTER — Other Ambulatory Visit: Payer: Self-pay | Admitting: Medical

## 2016-12-03 NOTE — Telephone Encounter (Signed)
Returned call to pt.  Reached Clinical biochemist.

## 2016-12-10 ENCOUNTER — Other Ambulatory Visit: Payer: Self-pay | Admitting: Medical

## 2016-12-15 ENCOUNTER — Encounter (HOSPITAL_COMMUNITY): Payer: Self-pay | Admitting: Emergency Medicine

## 2016-12-15 ENCOUNTER — Ambulatory Visit (HOSPITAL_COMMUNITY)
Admission: EM | Admit: 2016-12-15 | Discharge: 2016-12-15 | Disposition: A | Discharge status: Home / Self Care | Payer: Medicare Other | Attending: Family Medicine | Admitting: Family Medicine

## 2016-12-15 DIAGNOSIS — I10 Essential (primary) hypertension: Secondary | ICD-10-CM

## 2016-12-15 DIAGNOSIS — E785 Hyperlipidemia, unspecified: Secondary | ICD-10-CM

## 2016-12-15 MED ORDER — ISOSORBIDE MONONITRATE ER 30 MG PO TB24: 30.0000 mg | ORAL_TABLET | Freq: Every day | ORAL | 0 refills | Status: DC

## 2016-12-15 MED ORDER — ASPIRIN 81 MG PO CHEW: CHEWABLE_TABLET | ORAL | 0 refills | Status: DC

## 2016-12-15 MED ORDER — CARVEDILOL 25 MG PO TABS: 25.0000 mg | ORAL_TABLET | Freq: Two times a day (BID) | ORAL | 0 refills | Status: DC

## 2016-12-15 MED ORDER — ATORVASTATIN CALCIUM 20 MG PO TABS: ORAL_TABLET | ORAL | 0 refills | Status: DC

## 2016-12-15 MED ORDER — CLOPIDOGREL BISULFATE 75 MG PO TABS: 75.0000 mg | ORAL_TABLET | Freq: Every day | ORAL | 0 refills | Status: DC

## 2016-12-15 MED ORDER — LISINOPRIL 20 MG PO TABS: 20.0000 mg | ORAL_TABLET | Freq: Every day | ORAL | 0 refills | Status: DC

## 2016-12-15 MED ORDER — NITROGLYCERIN 0.4 MG SL SUBL: 0.4000 mg | SUBLINGUAL_TABLET | SUBLINGUAL | 0 refills | Status: DC | PRN

## 2016-12-15 NOTE — ED Triage Notes (Signed)
Patient requesting medication refill.  Patient left the area for a period of time, missed appt, now dropped from previous pcp patient list.

## 2016-12-16 ENCOUNTER — Telehealth: Payer: Self-pay | Admitting: Family Medicine

## 2016-12-16 NOTE — Telephone Encounter (Signed)
Spoke with Audelia Acton and advised him regarding the pt having to take care of his sister while she was dying of cancer.  He was the only one available to do that.  We will give him another chance. Called pt he said he is waiting to hear from another dr and will call me back.

## 2016-12-17 NOTE — ED Provider Notes (Signed)
  [place]   [id number] 12/15/16 Arrival Time: 5427  ASSESSMENT & PLAN:  1. Essential hypertension   2. Hyperlipidemia, unspecified hyperlipidemia type     Meds ordered this encounter  Medications  . ticagrelor (BRILINTA) 90 MG TABS tablet    Sig: Take by mouth 2 (two) times daily.  . nitroGLYCERIN (NITROSTAT) 0.4 MG SL tablet    Sig: Place 1 tablet (0.4 mg total) under the tongue every 5 (five) minutes x 3 doses as needed for chest pain.    Dispense:  25 tablet    Refill:  0  . lisinopril (PRINIVIL,ZESTRIL) 20 MG tablet    Sig: Take 1 tablet (20 mg total) by mouth daily.    Dispense:  30 tablet    Refill:  0  . isosorbide mononitrate (IMDUR) 30 MG 24 hr tablet    Sig: Take 1 tablet (30 mg total) by mouth daily.    Dispense:  30 tablet    Refill:  0  . clopidogrel (PLAVIX) 75 MG tablet    Sig: Take 1 tablet (75 mg total) by mouth daily.    Dispense:  30 tablet    Refill:  0  . atorvastatin (LIPITOR) 20 MG tablet    Sig: TAKE 1 TABLET(20 MG) BY MOUTH DAILY    Dispense:  30 tablet    Refill:  0  . aspirin 81 MG chewable tablet    Sig: One by mouth daily.    Dispense:  30 tablet    Refill:  0  . carvedilol (COREG) 25 MG tablet    Sig: Take 1 tablet (25 mg total) by mouth 2 (two) times daily with a meal.    Dispense:  60 tablet    Refill:  0   Reports that he has found another PCP and will schedule f/u within 2-3 weeks. He may f/u here as needed.  Reviewed expectations re: course of current medical issues. Questions answered. Outlined signs and symptoms indicating need for more acute intervention. Patient verbalized understanding. After Visit Summary given.   SUBJECTIVE:  Jay Fuller is a 61 y.o. male who presents for refill of medications he takes. Has been diagnosed with HTN and hyperlipidemia with h/o MI with stent placement. Previously discharged from his PCP secondary to not showing up for appointments. He reports feeling well  currently.  ROS: As per HPI.   OBJECTIVE:  Vitals:   12/15/16 1416  BP: 135/80  Pulse: 73  Resp: 20  Temp: 98.6 F (37 C)  TempSrc: Oral  SpO2: 99%     General appearance: alert; no distress Lungs: clear to auscultation bilaterally Heart: regular rate and rhythm Extremities: no cyanosis or edema; symmetrical with no gross deformities Skin: warm and dry Psychological:  alert and cooperative; normal mood and affect   No Known Allergies  PMHx, SurgHx, SocialHx, Medications, and Allergies were reviewed in the Visit Navigator and updated as appropriate.      Vanessa Kick, MD 12/17/16 [phone]

## 2017-01-13 ENCOUNTER — Telehealth: Payer: Self-pay

## 2017-01-13 ENCOUNTER — Ambulatory Visit: Payer: Medicare Other | Admitting: Medical

## 2017-01-13 NOTE — Telephone Encounter (Signed)
D and E

## 2017-01-13 NOTE — Telephone Encounter (Signed)

## 2017-01-14 ENCOUNTER — Telehealth: Payer: Self-pay | Admitting: Medical

## 2017-01-14 NOTE — Telephone Encounter (Signed)
Pt came in the office today stating that he has a 12:30 CPE with Audelia Acton. He was advised that his appointment was yesterday. Pt said he called last week to verify the appointment day and time and was told that it was 01/14/17. He scheduled another appointment for 01/20/17.

## 2017-01-15 ENCOUNTER — Telehealth: Payer: Self-pay | Admitting: Family Medicine

## 2017-01-15 NOTE — Telephone Encounter (Signed)
Patient was called and advised if next visit is a NO SHOW, we will not see him again.

## 2017-01-15 NOTE — Telephone Encounter (Signed)
Referring to Jay Fuller to call pt. Note we can not charge pt due to do insurance.

## 2017-01-15 NOTE — Telephone Encounter (Signed)
Called Dorthy, I advised him if he no showed this next appointment, we would not see him anymore.  He began to apologize and say he felt so bad, he got the dates mixed up.  He has his card and aware of date and time.

## 2017-01-20 ENCOUNTER — Encounter: Payer: Self-pay | Admitting: Medical

## 2017-01-20 ENCOUNTER — Telehealth: Payer: Self-pay | Admitting: Medical

## 2017-01-20 ENCOUNTER — Other Ambulatory Visit: Payer: Self-pay | Admitting: Medical

## 2017-01-20 ENCOUNTER — Ambulatory Visit (INDEPENDENT_AMBULATORY_CARE_PROVIDER_SITE_OTHER): Payer: Medicare Other | Admitting: Medical

## 2017-01-20 VITALS — BP 148/78 | HR 70 | Ht 65.0 in | Wt 186.0 lb

## 2017-01-20 DIAGNOSIS — F17200 Nicotine dependence, unspecified, uncomplicated: Secondary | ICD-10-CM

## 2017-01-20 DIAGNOSIS — E785 Hyperlipidemia, unspecified: Secondary | ICD-10-CM | POA: Diagnosis not present

## 2017-01-20 DIAGNOSIS — Z125 Encounter for screening for malignant neoplasm of prostate: Secondary | ICD-10-CM

## 2017-01-20 DIAGNOSIS — I10 Essential (primary) hypertension: Secondary | ICD-10-CM

## 2017-01-20 DIAGNOSIS — Z23 Encounter for immunization: Secondary | ICD-10-CM

## 2017-01-20 DIAGNOSIS — K7460 Unspecified cirrhosis of liver: Secondary | ICD-10-CM

## 2017-01-20 DIAGNOSIS — I2510 Atherosclerotic heart disease of native coronary artery without angina pectoris: Secondary | ICD-10-CM | POA: Diagnosis not present

## 2017-01-20 DIAGNOSIS — R0989 Other specified symptoms and signs involving the circulatory and respiratory systems: Secondary | ICD-10-CM | POA: Insufficient documentation

## 2017-01-20 DIAGNOSIS — E782 Mixed hyperlipidemia: Secondary | ICD-10-CM | POA: Diagnosis not present

## 2017-01-20 DIAGNOSIS — Z129 Encounter for screening for malignant neoplasm, site unspecified: Secondary | ICD-10-CM | POA: Insufficient documentation

## 2017-01-20 DIAGNOSIS — Z87891 Personal history of nicotine dependence: Secondary | ICD-10-CM

## 2017-01-20 DIAGNOSIS — Z0000 Encounter for general adult medical examination without abnormal findings: Secondary | ICD-10-CM | POA: Diagnosis not present

## 2017-01-20 DIAGNOSIS — R7301 Impaired fasting glucose: Secondary | ICD-10-CM

## 2017-01-20 DIAGNOSIS — I252 Old myocardial infarction: Secondary | ICD-10-CM | POA: Diagnosis not present

## 2017-01-20 DIAGNOSIS — Z1211 Encounter for screening for malignant neoplasm of colon: Secondary | ICD-10-CM | POA: Insufficient documentation

## 2017-01-20 DIAGNOSIS — Z136 Encounter for screening for cardiovascular disorders: Secondary | ICD-10-CM

## 2017-01-20 NOTE — Telephone Encounter (Signed)
Please schedule for the following: Carotid ultrasound AAA ultrasound ABIs

## 2017-01-20 NOTE — Progress Notes (Signed)
Subjective:   HPI  Jay Fuller is a 61 y.o. male who presents for a complete physical.  Medical team: Fizza Scales, Camelia Eng, PA-C here for primary care Hasn't seen eye doctor, dentist, cardiology or hepatitis clinic in over a year  Concerns: HTN - Compliant with Lisinopril HCT and amlodipine and coreg without c/o.  He didn't have his medications with him and there is some discrepancy regarding lisinopril.  hyperlipemia - compliant with Lipitor and ASA without c/o  CAD - hasn't seen cardiology in a while, compliant with Plavix, has NTG if needed, compliant with Imdur  He wants life scan ultrasounds screening of carotids, legs, given hx/o MI.  Paints cars part time, exercise some with walking.  Reviewed their medical, surgical, family, social, medication, and allergy history and updated chart as appropriate.  Past Medical History:  Diagnosis Date  . CAD (coronary artery disease)    a. 06/2013 NSTEMI/Cath/PCI: LM nl, LAD 20-40d, Diags small with 95, LCX 20p, 99d (2.25x16 Promus Premier DES), OM1 80/90p(2.5x20 Promus Premier DES), OM2 min irregs, LPL1 small, min irregs, RCA/RPDA min irregs, RPL nl, EF 55%.  . Chronic pain   . 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 Hepatitis B immune 2016  . Hepatitis C    s/p Harvoni therapy 2015  . HTN (hypertension)   . Hyperlipidemia   . Polysubstance abuse    history of cocaine, marijuana, tobacco abuse in the use  . Tobacco abuse    quit 06/2013.  has 12 pack year history  . Wears glasses     Past Surgical History:  Procedure Laterality Date  . COLONOSCOPY     denied, but Cologuard negative 12/2013  . CORONARY ANGIOPLASTY WITH STENT PLACEMENT  06/2013  . LEFT HEART CATHETERIZATION WITH CORONARY ANGIOGRAM N/A 06/23/2013   Procedure: LEFT HEART CATHETERIZATION WITH CORONARY ANGIOGRAM;  Surgeon: Leonie Man, MD;  Location[facility] CATH LAB;  Service: Cardiovascular;  Laterality: N/A;  . LIVER BIOPSY  2016    Social History   Social History   . Marital status: Single    Spouse name: N/A  . Number of children: N/A  . Years of education: N/A   Occupational History  . Not on file.   Social History Main Topics  . Smoking status: Former Smoker    Packs/day: 0.50    Years: 25.00    Quit date: 01/21/2016  . Smokeless tobacco: Never Used  . Alcohol use 1.2 oz/week    2 Cans of beer per week     Comment: twice weekly  . Drug use: Yes     Comment: uses cocaine and marijuana regularly.  [name] Sexual activity: Yes   Other Topics Concern  . Not on file   Social History Narrative   His 69yo son lives with him, some exercise with walking, currently painting cars part time.   Lives with girlfirend as well.    Family History  Problem Relation Age of Onset  . Hypertension Mother   . Heart disease Father   . Hypertension Father   . Hypertension Sister   . Stroke Sister   . Hypertension Sister   . Cancer Sister        pancreatic  . Heart disease Sister        CHF  . Hypertension Sister   . Diabetes Neg Hx      Current Outpatient Prescriptions:  .  amLODipine (NORVASC) 10 MG tablet, TAKE 1 TABLET(10 MG) BY MOUTH DAILY, Disp:  90 tablet, Rfl: 0 .  aspirin 81 MG chewable tablet, One by mouth daily., Disp: 30 tablet, Rfl: 0 .  atorvastatin (LIPITOR) 20 MG tablet, TAKE 1 TABLET(20 MG) BY MOUTH DAILY, Disp: 30 tablet, Rfl: 0 .  carvedilol (COREG) 25 MG tablet, Take 1 tablet (25 mg total) by mouth 2 (two) times daily with a meal., Disp: 60 tablet, Rfl: 0 .  clopidogrel (PLAVIX) 75 MG tablet, Take 1 tablet (75 mg total) by mouth daily., Disp: 30 tablet, Rfl: 0 .  diphenhydrAMINE (SOMINEX) 25 MG tablet, Take 25 mg by mouth at bedtime as needed for sleep., Disp: , Rfl:  .  ibuprofen (ADVIL,MOTRIN) 800 MG tablet, Take 1 tablet (800 mg total) by mouth 3 (three) times daily., Disp: 21 tablet, Rfl: 0 .  isosorbide mononitrate (IMDUR) 30 MG 24 hr tablet, Take 1 tablet (30 mg total) by mouth daily., Disp: 30 tablet, Rfl: 0 .  lisinopril  (PRINIVIL,ZESTRIL) 20 MG tablet, Take 1 tablet (20 mg total) by mouth daily., Disp: 30 tablet, Rfl: 0 .  lisinopril-hydrochlorothiazide (PRINZIDE,ZESTORETIC) 20-12.5 MG tablet, Take 1 tablet by mouth daily., Disp: 90 tablet, Rfl: 3 .  lisinopril-hydrochlorothiazide (PRINZIDE,ZESTORETIC) 20-12.5 MG tablet, TAKE 1 TABLET BY MOUTH DAILY, Disp: 90 tablet, Rfl: 0 .  nitroGLYCERIN (NITROSTAT) 0.4 MG SL tablet, Place 1 tablet (0.4 mg total) under the tongue every 5 (five) minutes x 3 doses as needed for chest pain., Disp: 25 tablet, Rfl: 0 .  ranitidine (ZANTAC) 150 MG tablet, Take 1 tablet (150 mg total) by mouth 2 (two) times daily., Disp: 60 tablet, Rfl: 0 .  cephALEXin (KEFLEX) 500 MG capsule, Take 1 capsule (500 mg total) by mouth 4 (four) times daily., Disp: 20 capsule, Rfl: 0 .  ticagrelor (BRILINTA) 90 MG TABS tablet, Take by mouth 2 (two) times daily., Disp: , Rfl:   No Known Allergies  Review of Systems Constitutional: -fever, -chills, -sweats, -unexpected weight change, -decreased appetite, -fatigue Allergy: -sneezing, -itching, -congestion Dermatology: -changing moles, --rash, -lumps ENT: -runny nose, -ear pain, -sore throat, -hoarseness, -sinus pain, -teeth pain, - ringing in ears, -hearing loss, -nosebleeds Cardiology: -chest pain, -palpitations, -swelling, -difficulty breathing when lying flat, -waking up short of breath Respiratory: -cough, -shortness of breath, -difficulty breathing with exercise or exertion, -wheezing, -coughing up blood Gastroenterology: -abdominal pain, -nausea, -vomiting, -diarrhea, -constipation, -blood in stool, -changes in bowel movement, -difficulty swallowing or eating Hematology: -bleeding, -bruising  Musculoskeletal: -joint aches, -muscle aches, -joint swelling, -back pain, -neck pain, -cramping, -changes in gait Ophthalmology: denies vision changes, eye redness, itching, discharge Urology: -burning with urination, -difficulty urinating, -blood in urine,  -urinary frequency, -urgency, -incontinence Neurology: -headache, -weakness, -tingling, -numbness, -memory loss, -falls, -dizziness Psychology: -depressed mood, -agitation, -sleep problems     Objective:   Physical Exam  BP (!) 148/78   Pulse 70   Ht 5\' 5"  (1.651 m)   Wt 186 lb (84.4 kg)   SpO2 96%   BMI 30.95 kg/m   General appearance: alert, no distress, WD/WN, AA male Skin: there are some linear scars on left antecubital space, few scattered macules, no worrisome lesions HEENT: normocephalic, conjunctiva/corneas normal, sclerae anicteric, PERRLA, EOMi, nares patent, no discharge or erythema, pharynx normal Oral cavity: MMM, tongue normal, teeth - dentures upper and lower Neck: supple, no lymphadenopathy, no thyromegaly, no masses, normal ROM, no bruits Chest: non tender, normal shape and expansion Heart: RRR, normal S1, S2, no murmurs Lungs: CTA bilaterally, no wheezes, rhonchi, or rales Abdomen: +bs, soft, non tender, non distended,  no masses, +mild hepatomegaly, no splenomegaly, no bruits Back: non tender, normal ROM, no scoliosis Musculoskeletal: upper extremities non tender, no obvious deformity, normal ROM throughout, lower extremities non tender, no obvious deformity, normal ROM throughout Extremities: no edema, no cyanosis, no clubbing Pulses: 2+ symmetric, upper and lower extremities, normal cap refill Neurological: alert, oriented x 3, CN2-12 intact, strength normal upper extremities and lower extremities, sensation normal throughout, DTRs 2+ throughout, no cerebellar signs, gait normal Psychiatric: normal affect, behavior normal, pleasant  GU: normal male external genitalia, nontender, no masses, no hernia, no lymphadenopathy Rectal: anus normal tone, normal appearing, prostate WNL, no nodules    Adult ECG Report  Indication: hx/o MI, HTN  Rate: 62 bpm  Rhythm: normal sinus rhythm  QRS Axis: 41 degrees  PR Interval: 162ms  QRS Duration: 40ms  QTc: 454ms   Conduction Disturbances: none  Other Abnormalities: none  Patient's cardiac risk factors are: advanced age (older than 73 for men, 55 for women), dyslipidemia, hypertension and male gender.  EKG comparison: 2015 EKG  Narrative Interpretation: no worrisome findings today     Assessment and Plan :   Encounter Diagnoses  Name Primary?  . Essential hypertension Yes  . Coronary artery disease involving native coronary artery of native heart without angina pectoris   . Cirrhosis of liver without ascites, unspecified hepatic cirrhosis type ([place])   . Impaired fasting blood sugar   . Former smoker   . History of MI (myocardial infarction)   . Hyperlipidemia, unspecified hyperlipidemia type   . Mixed dyslipidemia   . Encounter for health maintenance examination in adult   . Screening for prostate cancer   . Decreased pulses in feet   . Bruit   . Need for influenza vaccination   . Medicare annual wellness visit, subsequent    Physical exam - discussed healthy lifestyle, diet, exercise, preventative care, vaccinations, and addressed their concerns.   See your eye doctor yearly for routine vision care. See your dentist yearly for routine dental care including hygiene visits twice yearly. CAD - needs f/u with cardiology.  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  Completed treated with Harvoni with great response Impaired glucose -f/u pending labs, discussed diet, exercise, diagnosis criteria for diabetes Although he has quit tobacco, he does have second hand tobacco smoke in the house Cirrhosis of liver - discussed future complications to be aware of.  Referral for carotid US, AAA, and ABIs given hx/o MI, smoking, CAD Counseled on the influenza virus vaccine.  Vaccine information sheet  given.  Influenza vaccine given after consent obtained. Counseled on shingles and pneumococcal vaccines.  He will consider.  Follow-up pending labs  Nivin was seen today for [employer].  Diagnoses and all orders for this visit:  Essential hypertension -     EKG 12-Lead -     CBC with Differential/Platelet -     Lipid panel -     Hemoglobin A1c -     Hepatic function panel -     Renal Function Panel -     VAS US CAROTID; Future -     VAS [place] ABI WITH/WO TBI; Future -     VAS [place] AAA DUPLEX; Future  Coronary artery disease involving native coronary artery of native heart without angina pectoris -  VAS US CAROTID; Future -     VAS [place] ABI WITH/WO TBI; Future -     VAS [place] AAA DUPLEX; Future  Cirrhosis of liver without ascites, unspecified hepatic cirrhosis type (H[facility]) -     AFP tumor marker  Impaired fasting blood sugar -     Hemoglobin A1c  Former smoker  History of MI (myocardial infarction) -     EKG 12-Lead -     Lipid panel -     Hepatic function panel -     Renal Function Panel -     VAS US CAROTID; Future -     VAS [place] ABI WITH/WO TBI; Future -     VAS [place] AAA DUPLEX; Future  Hyperlipidemia, unspecified hyperlipidemia type -     EKG 12-Lead -     Lipid panel -     Hepatic function panel -     Renal Function Panel -     VAS US CAROTID; Future -     VAS [place] ABI WITH/WO TBI; Future -     VAS [place] AAA DUPLEX; Future  Mixed dyslipidemia -     EKG 12-Lead -     Lipid panel -     Hepatic function panel -     Renal Function Panel  Encounter for health maintenance examination in adult  Screening for prostate cancer  Decreased pulses in feet -     PSA -     VAS US CAROTID; Future -     VAS [place] ABI WITH/WO TBI; Future -     VAS [place] AAA DUPLEX; Future  Bruit -     VAS US CAROTID; Future -     VAS [place] ABI WITH/WO TBI; Future -     VAS [place] AAA DUPLEX; Future  Need for influenza vaccination -     Flu Vaccine QUAD 36+ mos IM  Medicare annual wellness  visit, subsequent

## 2017-01-20 NOTE — Telephone Encounter (Signed)
Also refer for updated Cologuard

## 2017-01-20 NOTE — Telephone Encounter (Signed)
The order that you put for the aaa  Is not correct can the order to [id number]

## 2017-01-20 NOTE — Patient Instructions (Signed)
MEDICARE PREVENTATIVE SERVICES AND PERSONALIZED PLAN for Jay Fuller January 20, 2017  Thank you for trusting [place] with your health care.   Here is a summary and plan from your visit.  Your list of diagnoses today/Problem List: Encounter Diagnoses  Name Primary?  . Essential hypertension Yes  . Coronary artery disease involving native coronary artery of native heart without angina pectoris   . Cirrhosis of liver without ascites, unspecified hepatic cirrhosis type ([place])   . Impaired fasting blood sugar   . Former smoker   . History of MI (myocardial infarction)   . Hyperlipidemia, unspecified hyperlipidemia type   . Mixed dyslipidemia   . Encounter for health maintenance examination in adult   . Screening for prostate cancer   . Decreased pulses in feet   . Bruit   . Need for influenza vaccination   . Medicare annual wellness visit, subsequent       Cancer Screening Colorectal cancer screening: we will refer for updated Cologuard test  Prostate cancer screening: done today  Cardiovascular Screening Blood pressure: BP Readings from Last 3 Encounters:  01/20/17 (!) 148/78  12/15/16 135/80  04/08/16 136/80      Last EKG today   Abdominal Aortic Aneurysm Screen Referral today for this and carotid ultrasound and blood flow screen in legs   Diabetes Screening Lab Results  Component Value Date   HGBA1C 6.4 (H) 07/25/2015     Weight Screen/BMI Screen  Wt Readings from Last 3 Encounters:  01/20/17 186 lb (84.4 kg)  04/08/16 190 lb (86.2 kg)  07/25/15 193 lb (87.5 kg)    Your height to weight ratio is Body mass index is 30.95 kg/m.  Reference Range:  Underweight: BMI less than 18.5.  Normal weight: BMI between 18.5 and 24.9.  Overweight: BMI between 25 and 29.9.  Obese: BMI of 30 and above.   Your listed Vaccines/Immunizations: Immunization History  Administered Date(s) Administered  . Hepatitis A, Adult 07/22/2013, 07/25/2015  .  Influenza,inj,Quad PF,6+ Mos 06/24/2013, 01/22/2015, 01/20/2017  . Pneumococcal Polysaccharide-23 06/24/2013  . Tdap 06/14/2012    I recommend the following vaccines: I recommend you have a shingles vaccine to help prevent shingles or herpes zoster outbreak.   Please call your insurer to inquire about coverage for the Shingrix vaccine given in 2 doses.   Some insurers cover this vaccine after age 18, some cover this after age 17.  If your insurer covers this, then call to schedule appointment to have this vaccine here.  I recommend you also call insurance about Prevnar 13 vaccine as well    GENERAL RECOMMENDATIONS FOR GOOD HEALTH:  Supplements:  . Take a daily baby Aspirin 81mg  at bedtime for heart health unless you have a history of gastrointestinal bleed, allergy to aspirin, or are already taking higher dose Aspirin or other antiplatelet or blood thinner medication.   . Consume 1200 mg of Calcium daily through dietary calcium or supplement if you are male age 80 or older, or men 12 and older.   Men aged 72-70 should consume 1000 mg of Calcium daily. . Take 600 IU of Vitamin D daily.  Take 800 IU of Calcium daily if you are older than age 46.  . Take a general multivitamin daily.   Healthy diet: Eat a variety of foods, including fruits, vegetables, vegetable protein such as beans, lentils, tofu, and grains, such as rice.  Limit meat or animal protein, but if you eat meat, choose leans cuts such as chicken, fish, or  [place].  Drink plenty of water daily.  Decrease saturated fat in the diet, avoid lots of red meat, processed foods, sweets, fast foods, and fried foods.  Limit salt and caffeine intake.  Exercise: Aerobic exercise helps maintain good heart health. Weight bearing exercise helps keep bones and muscles working strong.  We recommend at least 30-40 minutes of exercise most days of the week.   Fall prevention: Falls are the leading cause of injuries, accidents, and accidental  deaths in people over the age of 2. Falling is a real threat to your ability to live on your own.  Causes include poor eyesight or poor hearing, illness, poor lighting, throw rugs, clutter in your home, and medication side effects causing dizziness or balance problems.  Such medications can include medications for depression, sleep problems, high blood pressure, diabetes, and heart conditions.   PREVENTION  Be sure your home is as safe as possible. Here are some tips:  Wear shoes with non-skid soles (not house slippers).   Be sure your home and outside area are well lit.   Use night lights throughout your house, including hallways and stairways.   Remove clutter and clean up spills on floors and walkways.   Remove throw rugs or fasten them to the floor with carpet tape. Tack down carpet edges.   Do not place electrical cords across pathways.   Install grab bars in your bathtub, shower, and toilet area. Towel bars should not be used as a grab bar.   Install handrails on both sides of stairways.   Do not climb on stools or stepladders. Get someone else to help with jobs that require climbing.   Do not wax your floors at all, or use a non-skid wax.   Repair uneven or unsafe sidewalks, walkways or stairs.   Keep frequently used items within reach.   Be aware of pets so you do not trip.  Get regular check-ups from your doctor, and take good care of yourself:  Have your eyes checked every year for vision changes, cataracts, glaucoma, and other eye problems. Wear eyeglasses as directed.   Have your hearing checked every 2 years, or anytime you or others think that you cannot hear well. Use hearing aids as directed.   See your caregiver if you have foot pain or corns. Sore feet can contribute to falls.   Let your caregiver know if a medicine is making you feel dizzy or making you lose your balance.   Use a cane, walker, or wheelchair as directed. Use walker or wheelchair brakes when  getting in and out.   When you get up from bed, sit on the side of the bed for 1 to 2 minutes before you stand up. This will give your blood pressure time to adjust, and you will feel less dizzy.   If you need to go to the bathroom often, consider using a bedside commode.  Disease prevention:  If you smoke or chew tobacco, find out from your caregiver how to quit. It can literally save your life, no matter how long you have been a tobacco user. If you do not use tobacco, never begin. Medicare does cover some smoking cessation counseling.  Maintain a healthy diet and normal weight. Increased weight leads to problems with blood pressure and diabetes. We check your height, weight, and BMI as part of your yearly visit.  The Body Mass Index or BMI is a way of measuring how much of your body is fat. Having a  BMI above 27 increases the risk of heart disease, diabetes, hypertension, stroke and other problems related to obesity. Your caregiver can help determine your BMI and based on it develop an exercise and dietary program to help you achieve or maintain this important measurement at a healthful level.  High blood pressure causes heart and blood vessel problems.  Persistent high blood pressure should be treated with medicine if weight loss and exercise do not work.  We check your blood pressure as part of your yearly visit.  Avoid drinking alcohol in excess (more than two drinks per day).  Avoid use of street drugs. Do not share needles with anyone. Ask for professional help if you need assistance or instructions on stopping the use of alcohol, cigarettes, and/or drugs.  Brush your teeth twice a day with fluoride toothpaste, and floss once a day. Good oral hygiene prevents tooth decay and gum disease. The problems can be painful, unattractive, and can cause other health problems. Visit your dentist for a routine oral and dental checkup and preventive care every 6-12 months.   See your eye doctor yearly  for routine screening for things like glaucoma.  Look at your skin regularly.  Use a mirror to look at your back. Notify your caregivers of changes in moles, especially if there are changes in shapes, colors, a size larger than a pencil eraser, an irregular border, or development of new moles.  Safety:  Use seatbelts 100% of the time, whether driving or as a passenger.  Use safety devices such as hearing protection if you work in environments with loud noise or significant background noise.  Use safety glasses when doing any work that could send debris in to the eyes.  Use a helmet if you ride a bike or motorcycle.  Use appropriate safety gear for contact sports.  Talk to your caregiver about gun safety.  Use sunscreen with a SPF (or skin protection factor) of 15 or greater.  Lighter skinned people are at a greater risk of skin cancer. Don't forget to also wear sunglasses in order to protect your eyes from too much damaging sunlight. Damaging sunlight can accelerate cataract formation.   If you have multiple sexual partners, or if you are not in a monogamous relationship, practice safe sex. Use condoms. Condoms are used to help reduce the spread of sexually transmitted infections (or STIs).  Consider an HIV test if you have never been tested.  Consider routine screening for STIs if you have multiple sexual partners.   Keep carbon monoxide and smoke detectors in your home functioning at all times. Change the batteries every 6 months or use a model that plugs into the wall or is hard wired in.   END OF LIFE PLANNING/ADVANCED DIRECTIVES Advance health-care planning is deciding the kind of care you want at the end of life. While alert competent adults are able to exercise their rights to make health care and financial decisions, problems arise when an individual becomes unconscious, incapacitated, or otherwise unable to communicate or make such decisions. Advance health care directives are the legal  documents in which you give written instructions about your choices limited, aggressive or palliative care if, in the future, you cannot speak for yourself.  Advanced directives include the following: [place] allows you to appoint someone to act as your health care agent to make health care decisions for you should it be determined by your health care provider that you are no longer able to  make these decisions for yourself.  A Living Will is a legal document in which you can declare that under certain conditions you desire your life not be prolonged by extraordinary or artificial means during your last illness or when you are near death. We can provide you with sample advanced directives, you can get an attorney to prepare these for you, or you can visit [place] Secretary of State's website for additional information and resources at [email or web address]  Further, I recommend you have an attorney prepare a Will and Durable Power of Attorney if you haven't done so already.  Please get [place] a copy of your health care Advanced Directives.   PREVENTATIV E CARE RECOMMENDATIONS:  Vaccinations: We recommend the following vaccinations as part of your preventative care:  Pneumococcal vaccine is recommended to protect against certain types of pneumonia.  This is normally recommended for adults age 29 or older once, or up to every 5 years for those at high risk.  The vaccine is also recommended for adults younger than 62 years old with certain underlying conditions that make them high risk for pneumonia.  Influenza vaccine is recommended to protect against seasonal influenza or "the flu." Influenza is a serious disease that can lead to hospitalization and sometimes even death. Traditional flu vaccines (called trivalent vaccines) are made to protect against three flu viruses; an influenza A (H1N1) virus, an influenza A (H3N2) virus, and an influenza B virus. In addition, there  are flu vaccines made to protect against four flu viruses (called "quadrivalent" vaccines). These vaccines protect against the same viruses as the trivalent vaccine and an additional B virus.  We recommend the high dose influenza vaccine to those 65 years and older.  Hepatitis B vaccine to protect against a form of infection of the liver by a virus acquired from blood or body fluids, particularly for high risk groups.  Td or Tdap vaccine to protect against Tetanus, diphtheria and pertussis which can be very serious.  These diseases are caused by bacteria.  Diphtheria and pertussis are spread from person to person through coughing or sneezing.  Tetanus enters the body through cuts, scratches, or wounds.  Tetanus (Lockjaw) causes painful muscle tightening and stiffness, usually all over the body.  Diphtheria can cause a thick coating to form in the back of the throat.  It can lead to breathing problems, paralysis, heart failure, and death.  Pertussis (Whooping Cough) causes severe coughing spells, which can cause difficulty breathing, vomiting and disturbed sleep.  Td or Tdap is usually given every 10 years.  Shingles vaccine to protect against Varicella Zoster if you are older than age 72, or younger than 62 years old with certain underlying illness.    Cancer Screening: Most routine colon cancer screening begins at the age of 63.  Subsequent colonoscopies are performed either every 5-10 years for normal screening, or every 2-5 years for higher risks patients, up until age 63 years of age. Annual screening is done with easy to use take-home tests to check for hidden blood in the stool called hemoccult tests.  Sigmoidoscopy or colonoscopy can detect the earliest forms of colon cancer and is life saving. These tests use a small camera at the end of a tube to directly examine the colon.   Osteoporosis Screening: Screening for osteoporosis usually begins at age 65 for women, and can be done as frequent as  every 2 years.  However, women or men with higher risk of osteoporosis may be screened earlier than age  65.  Osteoporosis or low bone mass is diminished bone strength from alterations in bone architecture leading to bone fragility and increased fracture risk.     Cardiovascular Screening: Fat and cholesterol leaves deposits in your arteries that can block them. This causes heart disease and vessel disease elsewhere in your body.  If your cholesterol is found to be high, or if you have heart disease or certain other medical conditions, then you may need to have your cholesterol monitored frequently and be treated with medication. Cardiovascular screening in the form of lab tests for cholesterol, HDL and triglycerides can be done every 5 years.  A screening electrocardiogram can be done as part of the Welcome to Medicare physical.  Diabetes Screening: Diabetes screening can be done at least every 3 years for those with risk factors,  or every 6-69months for prediabetic patients.  Screening includes fasting blood sugar test or glucose tolerance test.  Risk factors include hypertension, dyslipidemia, obesity, previously abnormal glucose tests, family history of diabetes, age 54 years or older, and history of gestations diabetes.   AAA (abdominal aortic aneurysm) Screening: Medicare allows for a one time ultrasound to screen for abdominal aortic aneurysm if done as a referral as part of the Welcome to Medicare exam.  Men eligible for this screening include those men between age 81-89 years of age who have smoked at least 100 cigarettes in his lifetime and/or has a family history of AAA.  HIV Screening:  Medicare allows for yearly screening for patients at high risk for contracting HIV disease.

## 2017-01-21 ENCOUNTER — Other Ambulatory Visit: Payer: Self-pay | Admitting: Medical

## 2017-01-21 DIAGNOSIS — R772 Abnormality of alphafetoprotein: Secondary | ICD-10-CM

## 2017-01-21 DIAGNOSIS — K739 Chronic hepatitis, unspecified: Secondary | ICD-10-CM

## 2017-01-21 LAB — CBC WITH DIFFERENTIAL/PLATELET
Basophils Absolute: 17 cells/uL (ref 0–200)
Basophils Relative: 0.3 %
EOS PCT: 1.7 %
Eosinophils Absolute: 97 cells/uL (ref 15–500)
HEMATOCRIT: 43.4 % (ref 38.5–50.0)
HEMOGLOBIN: 14.5 g/dL (ref 13.2–17.1)
LYMPHS ABS: 2394 {cells}/uL (ref 850–3900)
MCH: 27.8 pg (ref 27.0–33.0)
MCHC: 33.4 g/dL (ref 32.0–36.0)
MCV: 83.3 fL (ref 80.0–100.0)
MONOS PCT: 7.5 %
MPV: 10.5 fL (ref 7.5–12.5)
NEUTROS ABS: 2765 {cells}/uL (ref 1500–7800)
Neutrophils Relative %: 48.5 %
Platelets: 199 10*3/uL (ref 140–400)
RBC: 5.21 10*6/uL (ref 4.20–5.80)
RDW: 14.9 % (ref 11.0–15.0)
Total Lymphocyte: 42 %
WBC mixed population: 428 cells/uL (ref 200–950)
WBC: 5.7 10*3/uL (ref 3.8–10.8)

## 2017-01-21 LAB — PSA: PSA: 0.8 ng/mL (ref <=4.0)

## 2017-01-21 LAB — RENAL FUNCTION PANEL
ALBUMIN MSPROF: 4.6 g/dL (ref 3.6–5.1)
BUN: 8 mg/dL (ref 7–25)
CO2: 22 mmol/L (ref 20–32)
CREATININE: 0.99 mg/dL (ref 0.70–1.25)
Calcium: 9.4 mg/dL (ref 8.6–10.3)
Chloride: 105 mmol/L (ref 98–110)
Glucose, Bld: 118 mg/dL — ABNORMAL HIGH (ref 65–99)
PHOSPHORUS: 3.3 mg/dL (ref 2.5–4.5)
Potassium: 3.9 mmol/L (ref 3.5–5.3)
Sodium: 138 mmol/L (ref 135–146)

## 2017-01-21 LAB — LIPID PANEL
CHOLESTEROL: 136 mg/dL (ref <200)
HDL: 44 mg/dL (ref >40)
LDL Cholesterol (Calc): 64 mg/dL (calc)
Non-HDL Cholesterol (Calc): 92 mg/dL (calc) (ref <130)
Total CHOL/HDL Ratio: 3.1 (calc) (ref <5.0)
Triglycerides: 214 mg/dL — ABNORMAL HIGH (ref <150)

## 2017-01-21 LAB — HEMOGLOBIN A1C
EAG (MMOL/L): 6.5 (calc)
Hgb A1c MFr Bld: 5.7 % of total Hgb — ABNORMAL HIGH (ref <5.7)
Mean Plasma Glucose: 117 (calc)

## 2017-01-21 LAB — HEPATIC FUNCTION PANEL
AG RATIO: 1.7 (calc) (ref 1.0–2.5)
ALKALINE PHOSPHATASE (APISO): 95 U/L (ref 40–115)
ALT: 12 U/L (ref 9–46)
AST: 19 U/L (ref 10–35)
Albumin: 4.6 g/dL (ref 3.6–5.1)
Bilirubin, Direct: 0.1 mg/dL (ref 0.0–0.2)
GLOBULIN: 2.7 g/dL (ref 1.9–3.7)
Indirect Bilirubin: 0.4 mg/dL (calc) (ref 0.2–1.2)
TOTAL PROTEIN: 7.3 g/dL (ref 6.1–8.1)
Total Bilirubin: 0.5 mg/dL (ref 0.2–1.2)

## 2017-01-21 LAB — AFP TUMOR MARKER: AFP-Tumor Marker: 7.1 ng/mL — ABNORMAL HIGH (ref <6.1)

## 2017-01-21 MED ORDER — NITROGLYCERIN 0.4 MG SL SUBL: 0.4000 mg | SUBLINGUAL_TABLET | SUBLINGUAL | 0 refills | Status: DC | PRN

## 2017-01-21 MED ORDER — ATORVASTATIN CALCIUM 20 MG PO TABS: ORAL_TABLET | ORAL | 3 refills | Status: DC

## 2017-01-21 MED ORDER — AZILSARTAN-CHLORTHALIDONE 40-12.5 MG PO TABS: 1.0000 | ORAL_TABLET | Freq: Every day | ORAL | 2 refills | Status: DC

## 2017-01-21 MED ORDER — CARVEDILOL 25 MG PO TABS: 25.0000 mg | ORAL_TABLET | Freq: Two times a day (BID) | ORAL | 1 refills | Status: DC

## 2017-01-21 MED ORDER — ISOSORBIDE MONONITRATE ER 30 MG PO TB24: 30.0000 mg | ORAL_TABLET | Freq: Every day | ORAL | 0 refills | Status: DC

## 2017-01-21 MED ORDER — ASPIRIN 81 MG PO CHEW: CHEWABLE_TABLET | ORAL | 3 refills | Status: DC

## 2017-01-21 MED ORDER — CLOPIDOGREL BISULFATE 75 MG PO TABS: 75.0000 mg | ORAL_TABLET | Freq: Every day | ORAL | 2 refills | Status: DC

## 2017-01-21 MED ORDER — AMLODIPINE BESYLATE 10 MG PO TABS: ORAL_TABLET | ORAL | 3 refills | Status: DC

## 2017-01-22 ENCOUNTER — Other Ambulatory Visit: Payer: Self-pay | Admitting: Medical

## 2017-01-22 ENCOUNTER — Other Ambulatory Visit: Payer: Self-pay

## 2017-01-23 ENCOUNTER — Ambulatory Visit (HOSPITAL_COMMUNITY)
Admission: RE | Admit: 2017-01-23 | Discharge: 2017-01-23 | Disposition: A | Discharge status: Home / Self Care | Payer: Medicare Other | Source: Ambulatory Visit | Attending: Medical | Admitting: Medical

## 2017-01-23 ENCOUNTER — Ambulatory Visit (HOSPITAL_COMMUNITY): Admission: RE | Admit: 2017-01-23 | Payer: Medicare Other | Source: Ambulatory Visit

## 2017-01-23 DIAGNOSIS — F17200 Nicotine dependence, unspecified, uncomplicated: Secondary | ICD-10-CM

## 2017-01-23 DIAGNOSIS — Z136 Encounter for screening for cardiovascular disorders: Secondary | ICD-10-CM | POA: Diagnosis not present

## 2017-01-24 ENCOUNTER — Encounter (HOSPITAL_COMMUNITY): Payer: Self-pay | Admitting: Emergency Medicine

## 2017-01-24 ENCOUNTER — Emergency Department (HOSPITAL_COMMUNITY)
Admission: EM | Admit: 2017-01-24 | Discharge: 2017-01-24 | Disposition: A | Discharge status: Home / Self Care | Payer: Medicare Other | Attending: Emergency Medicine | Admitting: Emergency Medicine

## 2017-01-24 DIAGNOSIS — Z5321 Procedure and treatment not carried out due to patient leaving prior to being seen by health care provider: Secondary | ICD-10-CM | POA: Insufficient documentation

## 2017-01-24 DIAGNOSIS — M79644 Pain in right finger(s): Secondary | ICD-10-CM | POA: Diagnosis not present

## 2017-01-24 NOTE — ED Notes (Signed)
Pt LWBS triage RN aware

## 2017-01-24 NOTE — ED Triage Notes (Addendum)
Pt states 3 days ago he cut his right index finger on a metal equipment. Healing laceration noted. Unsure of last tetanus. Full mobility to finger.

## 2017-02-01 ENCOUNTER — Telehealth: Payer: Self-pay | Admitting: Medical

## 2017-02-01 NOTE — Telephone Encounter (Signed)
P.A. EDARBYCLOR  

## 2017-02-07 NOTE — Telephone Encounter (Signed)
P.A. Carole Binning denied, covered alternatives are Losartan/HCTZ, Irbesartan/HCTZ, Valsartan/HCTZ, Candesartan/HCTZ do you want to switch?

## 2017-02-09 ENCOUNTER — Other Ambulatory Visit: Payer: Self-pay | Admitting: Medical

## 2017-02-09 MED ORDER — IRBESARTAN-HYDROCHLOROTHIAZIDE 300-12.5 MG PO TABS: 1.0000 | ORAL_TABLET | Freq: Every day | ORAL | 0 refills | Status: DC

## 2017-02-09 NOTE — Telephone Encounter (Signed)
STOP edarbychlor and change to Irbesartan HCT, f/u 37mo

## 2017-02-10 ENCOUNTER — Telehealth: Payer: Self-pay | Admitting: Medical

## 2017-02-10 NOTE — Telephone Encounter (Signed)
Jay Fuller, please change/update this

## 2017-02-10 NOTE — Telephone Encounter (Signed)
Per Kristeen Miss @ [facility], pt's CT order should be changed to [id number] for CT abdomen with & without due to hepatic protocol with dx's provided

## 2017-02-11 ENCOUNTER — Other Ambulatory Visit: Payer: Self-pay

## 2017-02-11 DIAGNOSIS — R109 Unspecified abdominal pain: Secondary | ICD-10-CM

## 2017-02-11 DIAGNOSIS — K7460 Unspecified cirrhosis of liver: Secondary | ICD-10-CM

## 2017-02-11 NOTE — Telephone Encounter (Signed)
Put in order

## 2017-02-11 NOTE — Telephone Encounter (Signed)
Called pt informed

## 2017-02-26 ENCOUNTER — Telehealth: Payer: Self-pay

## 2017-02-26 NOTE — Telephone Encounter (Signed)
Pt 's wife called and l/m wanting  to go over his medication list. Call his wife back and l/m for her to call us back.

## 2017-02-27 ENCOUNTER — Other Ambulatory Visit: Payer: Self-pay

## 2017-04-21 ENCOUNTER — Other Ambulatory Visit: Payer: Self-pay | Admitting: Medical

## 2017-04-21 ENCOUNTER — Telehealth: Payer: Self-pay | Admitting: Medical

## 2017-04-21 NOTE — Telephone Encounter (Signed)
Called about bp med recall seen on the news, advised them to contact pharmacy directly because they can determine if the meds they have been receiving are affected by the recall

## 2017-05-21 ENCOUNTER — Other Ambulatory Visit: Payer: Self-pay | Admitting: Medical

## 2017-05-22 NOTE — Telephone Encounter (Signed)
Called  And l/m for pt to call us back to make an appt for med check, sent in 30 day supply to pharmacy

## 2017-06-24 ENCOUNTER — Other Ambulatory Visit: Payer: Self-pay | Admitting: Medical

## 2017-07-02 ENCOUNTER — Ambulatory Visit: Payer: Medicare HMO | Admitting: Medical

## 2017-07-02 ENCOUNTER — Encounter: Payer: Self-pay | Admitting: Medical

## 2017-07-02 VITALS — BP 148/86 | HR 65 | Wt 189.2 lb

## 2017-07-02 DIAGNOSIS — K7460 Unspecified cirrhosis of liver: Secondary | ICD-10-CM

## 2017-07-02 DIAGNOSIS — R0989 Other specified symptoms and signs involving the circulatory and respiratory systems: Secondary | ICD-10-CM

## 2017-07-02 DIAGNOSIS — I2510 Atherosclerotic heart disease of native coronary artery without angina pectoris: Secondary | ICD-10-CM

## 2017-07-02 DIAGNOSIS — I252 Old myocardial infarction: Secondary | ICD-10-CM

## 2017-07-02 DIAGNOSIS — R772 Abnormality of alphafetoprotein: Secondary | ICD-10-CM

## 2017-07-02 DIAGNOSIS — R0600 Dyspnea, unspecified: Secondary | ICD-10-CM

## 2017-07-02 DIAGNOSIS — E785 Hyperlipidemia, unspecified: Secondary | ICD-10-CM

## 2017-07-02 DIAGNOSIS — Z8619 Personal history of other infectious and parasitic diseases: Secondary | ICD-10-CM

## 2017-07-02 DIAGNOSIS — I10 Essential (primary) hypertension: Secondary | ICD-10-CM

## 2017-07-02 NOTE — Progress Notes (Addendum)
Subjective: Chief Complaint  Patient presents with  . med check    med check    Here for med check.  HTN - compliant with irbetsartan HCT started last visit, compliant with Norvasc 10mg  daily, Coreg 25mg  BID.  No CP  He does report getting winded easily.  Hx/o hep c and treatment completed with Harvnoni.   He has not had updated abdominal imaging recommended last visit  Hasn't seen cardiology as advised last visit.   Exercise - walking several days per week  Quit tobacco 3wk ago.  Hx/o 20 ppd x 1/2 ppd.  Past Medical History:  Diagnosis Date  . CAD (coronary artery disease)    a. 06/2013 NSTEMI/Cath/PCI: LM nl, LAD 20-40d, Diags small with 95, LCX 20p, 99d (2.25x16 Promus Premier DES), OM1 80/90p(2.5x20 Promus Premier DES), OM2 min irregs, LPL1 small, min irregs, RCA/RPDA min irregs, RPL nl, EF 55%.  . Chronic pain   . Cirrhosis ([place])    due to Hep C  . Hepatitis B immune 2016  . Hepatitis C    s/p Harvoni therapy 2015  . HTN (hypertension)   . Hyperlipidemia   . Polysubstance abuse ([place])    history of cocaine, marijuana, tobacco abuse in the use  . Tobacco abuse    quit 06/2013.  has 12 pack year history  . Wears glasses    Current Outpatient Medications on File Prior to Visit  Medication Sig Dispense Refill  . amLODipine (NORVASC) 10 MG tablet TAKE 1 TABLET(10 MG) BY MOUTH DAILY 90 tablet 3  . aspirin 81 MG chewable tablet One by mouth daily. 90 tablet 3  . atorvastatin (LIPITOR) 20 MG tablet TAKE 1 TABLET(20 MG) BY MOUTH DAILY 90 tablet 3  . carvedilol (COREG) 25 MG tablet Take 1 tablet (25 mg total) by mouth 2 (two) times daily with a meal. 180 tablet 1  . clopidogrel (PLAVIX) 75 MG tablet TAKE 1 TABLET(75 MG) BY MOUTH DAILY 30 tablet 0  . diphenhydrAMINE (SOMINEX) 25 MG tablet Take 25 mg by mouth at bedtime as needed for sleep.    [name] ibuprofen (ADVIL,MOTRIN) 800 MG tablet Take 1 tablet (800 mg total) by mouth 3 (three) times daily. 21 tablet 0  .  irbesartan-hydrochlorothiazide (AVALIDE) 300-12.5 MG tablet TAKE 1 TABLET BY MOUTH DAILY 90 tablet 0  . isosorbide mononitrate (IMDUR) 30 MG 24 hr tablet TAKE 1 TABLET(30 MG) BY MOUTH DAILY 90 tablet 0  . nitroGLYCERIN (NITROSTAT) 0.4 MG SL tablet PLACE 1 TABLET UNDER THE TONGUE EVERY 5 MINURES FOR 3 DOSES AS NEEDED FOR CHEST PAIN 150 tablet 0  . ranitidine (ZANTAC) 150 MG tablet Take 1 tablet (150 mg total) by mouth 2 (two) times daily. 60 tablet 0   No current facility-administered medications on file prior to visit.    ROS as in subjective   Objective: BP (!) 148/86   Pulse 65   Wt 189 lb 3.2 oz (85.8 kg)   SpO2 98%   BMI 29.63 kg/m   BP Readings from Last 3 Encounters:  07/02/17 (!) 148/86  01/24/17 (!) 146/83  01/20/17 (!) 148/78   Wt Readings from Last 3 Encounters:  07/02/17 189 lb 3.2 oz (85.8 kg)  01/24/17 192 lb (87.1 kg)  01/20/17 186 lb (84.4 kg)   General appearance: alert, no distress, WD/WN,  Neck: supple, no lymphadenopathy, no thyromegaly, no masses, no bruits Heart: RRR, normal S1, S2, no murmurs Lungs: somewhat decreased breath sounds, no wheezes, rhonchi, or rales Abdomen: +bs, soft,  non tender, non distended, no masses, no hepatomegaly, no splenomegaly Pulses: 1+ symmetric, upper and lower extremities, normal cap refill Ext: no edema Neuro: non focal exam    Assessment: Encounter Diagnoses  Name Primary?  . Essential hypertension Yes  . Coronary artery disease involving native coronary artery of native heart without angina pectoris   . Cirrhosis of liver without ascites, unspecified hepatic cirrhosis type ([place])   . Hyperlipidemia, unspecified hyperlipidemia type   . Decreased pulses in feet   . History of MI (myocardial infarction)   . Elevated AFP   . History of hepatitis C   . Dyspnea, unspecified type     Plan: reviewed labs and visit notes from last visit.  C/t current medications  Advised CT abdomen given elevated AFP last visit.  He  didn't f/u with recommendation last visit.  Referral back to cardiology.   C/t same medications  dyspnea - discussed possible causes.  PFT reviewed and was abnormal. Begin trial of Anoro.  Discussed risks/benefits of medication. F/u 2 wk.   Jay Fuller was seen today for med check.  Diagnoses and all orders for this visit:  Essential hypertension  Coronary artery disease involving native coronary artery of native heart without angina pectoris -     Ambulatory referral to Cardiology  Cirrhosis of liver without ascites, unspecified hepatic cirrhosis type ([place])  Hyperlipidemia, unspecified hyperlipidemia type  Decreased pulses in feet  History of MI (myocardial infarction)  Elevated AFP  History of hepatitis C  Dyspnea, unspecified type -     Spirometry with Graph  Other orders -     umeclidinium-vilanterol (ANORO ELLIPTA) 62.5-25 MCG/INH AEPB; Inhale 1 puff into the lungs daily.

## 2017-07-03 ENCOUNTER — Telehealth: Payer: Self-pay | Admitting: Medical

## 2017-07-03 MED ORDER — UMECLIDINIUM-VILANTEROL 62.5-25 MCG/INH IN AEPB: 1.0000 | INHALATION_SPRAY | Freq: Every day | RESPIRATORY_TRACT | 0 refills | Status: DC

## 2017-07-03 NOTE — Telephone Encounter (Signed)
Refer back to cardiology for yearly f/u  Set up CT abdomen

## 2017-07-03 NOTE — Telephone Encounter (Signed)
Already sent cardiology

## 2017-07-03 NOTE — Addendum Note (Signed)
Addended by: Carlena Hurl on: 07/03/2017 03:14 AM   Modules accepted: Orders

## 2017-07-10 ENCOUNTER — Ambulatory Visit (INDEPENDENT_AMBULATORY_CARE_PROVIDER_SITE_OTHER): Payer: Medicaid Other | Admitting: Cardiology

## 2017-07-10 ENCOUNTER — Encounter: Payer: Self-pay | Admitting: Cardiology

## 2017-07-10 VITALS — BP 142/76 | HR 67 | Ht 67.0 in | Wt 189.0 lb

## 2017-07-10 DIAGNOSIS — E785 Hyperlipidemia, unspecified: Secondary | ICD-10-CM | POA: Diagnosis not present

## 2017-07-10 DIAGNOSIS — I10 Essential (primary) hypertension: Secondary | ICD-10-CM

## 2017-07-10 DIAGNOSIS — I2510 Atherosclerotic heart disease of native coronary artery without angina pectoris: Secondary | ICD-10-CM

## 2017-07-10 DIAGNOSIS — Z87891 Personal history of nicotine dependence: Secondary | ICD-10-CM

## 2017-07-10 DIAGNOSIS — I214 Non-ST elevation (NSTEMI) myocardial infarction: Secondary | ICD-10-CM | POA: Diagnosis not present

## 2017-07-10 DIAGNOSIS — Z9861 Coronary angioplasty status: Secondary | ICD-10-CM

## 2017-07-10 NOTE — Progress Notes (Signed)
PCP: Carlena Hurl, PA-C  Clinic Note: Chief Complaint  Patient presents with  . New Patient (Initial Visit)    Reestablishing cardiology care   . Coronary Artery Disease    HPI: Jay Fuller is a 62 y.o. male who is being seen today for the re-establishment of Cardiology care  For CAD-PCI in setting of NSTEMI (06/2013)at the request of Tysinger, Camelia Eng, PA-C.  Other PMH:  Hep C (status post treatment with Harvoni), HTN, and tobacco abuse. Stoped taking antihypertensives about 4 months prior to [facility] -- upon moving from [place] (ostensibly b/c he ran out of meds & did not have a local PCP).  NSTEMI Feb 2015 -- Troponin >20, and Utox ++ for cocaine & MJ.  Cath = Severe OM1 & dCx-LPL1 disease --> PCI x 2 with Promus Premier DES.    D/c to home 06/25/2013 with ASA, carvedilol, lisinopril , SL NTG and Ticagrelor.-->  Converted to Plavix  He actually did do c none ardiac rehab  [place] Harmes was last seen on 07/04/2013 by Ignacia Bayley, NP for f/u --> noted one mild episode of chest discomfort walking but otherwise is doing well.  (He indicated he had quit smoking and was "done with cocaine ") --> blood pressure was quite high.  Plan was to increase ACE inhibitor and add Imdur 30 m daily.  -->Was converted from Brilinta to Plavix for financial reasons.  Statin had not been continued (perhaps concern due to history of hepatitis C) --plan was to restart based on lab results. -->  Intention was for him to follow-up with me in May 2015 -> that never happened.  He has been managed by Chana Bode, PA. --He was supposed to have had evaluation with carotids, ABIs and AAA ultrasound --studies ordered, but not yet complete = Last visit with Mr. Tysinger was February 28. -->  Medications have been changed over to irbesartan-HCTZ along with Coreg 25 twice daily and amlodipine. ->  He has been walking several days a week. Quit smoking cigarettes this year after having restarted. -->  Has had  some intermittent restarts have after initially stopping cigarettes.  No further cocaine or marijuana since his heart attack Recent Hospitalizations: None  Studies Personally Reviewed - (if available, images/films reviewed: From Epic Chart or Care Everywhere) 06/2013:  NSTEMI  Cath PCI: LM nl, dLAD 20-40%, Diags small with ~95%; pLCx 20%, dLCx 99% * (DES PCI - 2.25x16 Promus Premier DES) & pOM1 80-90% (DES PCI: 2.5x20 Promus Premier DES), OM2 min irregs, LPL1 small, min irregs, RCA/RPDA min irregs, RPL nl, EF 55%.  2D Echo: Normal LV size and moderate thickness/moderate LVH.  Normal EF 55-60%. No RWMA. Gr 1 DD. Mlid LA dilation  Interval History: Jay Fuller presents here today to reestablish cardiology care.  Apparently his PCP feels that is probably about time for him to start getting back in to see a cardiologist.  As it turns out, his PCP is doing a good job taking care of his blood pressure and lipids.  Jay Fuller is not had any further symptoms of chest tightness pressure with rest or exertion since he was seen by Mr. Kathrene Bongo post MI. Says he walks about a mile to mile and a half every day and denies any significant symptoms.  No chest pain or shortness of breath with rest or exertion. No PND, orthopnea or edema. No palpitations, lightheadedness, dizziness, weakness or syncope/near syncope. No TIA/amaurosis fugax symptoms. No melena, hematochezia, hematuria, or epstaxis. No claudication.  ROS: A  comprehensive was performed. Review of Systems  Constitutional: Negative for chills, fever and malaise/fatigue.  HENT: Negative for congestion and nosebleeds.   Respiratory: Positive for cough (Still has some mild residual smoker's cough, but doing much better). Negative for shortness of breath.   Gastrointestinal: Negative for blood in stool and melena.  Genitourinary: Negative for dysuria and hematuria.  Musculoskeletal: Negative for joint pain and myalgias.  Neurological: Negative for  dizziness.  Endo/Heme/Allergies: Does not bruise/bleed easily.  Psychiatric/Behavioral: Negative for depression, memory loss and substance abuse. The patient is not nervous/anxious and does not have insomnia.   All other systems reviewed and are negative.   I have reviewed and (if needed) personally updated the patient's problem list, medications, allergies, past medical and surgical history, social and family history.   Past Medical History:  Diagnosis Date  . CAD S/P percutaneous coronary angioplasty 06/2013   a. NSTEMI/Cath/PCI: LM nl, dLAD 20-40%, Diags small with ~95%; pLCx 20%, dLCx 99% * (DES PCI - 2.25x16 Promus Premier DES) & pOM1 80-90% (DES PCI: 2.5x20 Promus Premier DES), OM2 min irregs, LPL1 small, min irregs, RCA/RPDA min irregs, RPL nl, EF 55%.  . Chronic pain   . 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 Hepatitis B immune 2016  . Hepatitis C    s/p Harvoni therapy 2015  . History of NON-ST ELEVATION MI 06/2013   99% dLCx (DES PCI); 80-90% OM1 (DES PCI) --normal EF.  Normal wall motion.;   . HTN (hypertension)   . Hyperlipidemia   . Polysubstance abuse ([place])    history of cocaine, marijuana, tobacco abuse in the use  . Tobacco abuse    quit 06/2016.  has 12 pack year history (quit 2015 - had relapse, but now quit)  . Wears glasses     Past Surgical History:  Procedure Laterality Date  . COLONOSCOPY     denied, but Cologuard negative 12/2013  . CORONARY ANGIOPLASTY WITH STENT PLACEMENT  06/2013    dLCx 99% going into LPL1 (DES PCI: 2.25x16 Promus Premier DES), p OM1 80-90% (DES PCI - 2.5x20 Promus Premier DES)  . LEFT HEART CATHETERIZATION WITH CORONARY ANGIOGRAM N/A 06/23/2013   Procedure: LEFT HEART CATHETERIZATION WITH CORONARY ANGIOGRAM;  Surgeon: Leonie Man, MD;  Location: [facility] CATH LAB:: LM nl, d LAD 20-40%, small caliber Diags w/ severel ~95% lesions, p LCX 20%, dLCx 99% going into LPL1 (PCI- DES), p OM1 80-90% (PCI -DES), OM2 min irregs, LPL1 small, min irregs,  RCA/RPDA min irregs, RPL nl, EF 55%.  [name] LIVER BIOPSY  2016  . TRANSTHORACIC ECHOCARDIOGRAM  06/2013   Normal LV size and moderate thickness/moderate LVH.  Normal EF 55-60%. No RWMA. Gr 1 DD. Mlid LA dilation    Current Meds  Medication Sig  . amLODipine (NORVASC) 10 MG tablet TAKE 1 TABLET(10 MG) BY MOUTH DAILY  . aspirin 81 MG chewable tablet One by mouth daily.  [name] atorvastatin (LIPITOR) 20 MG tablet TAKE 1 TABLET(20 MG) BY MOUTH DAILY  . carvedilol (COREG) 25 MG tablet Take 1 tablet (25 mg total) by mouth 2 (two) times daily with a meal.  . clopidogrel (PLAVIX) 75 MG tablet TAKE 1 TABLET(75 MG) BY MOUTH DAILY  . diphenhydrAMINE (SOMINEX) 25 MG tablet Take 25 mg by mouth at bedtime as needed for sleep.  [name] ibuprofen (ADVIL,MOTRIN) 800 MG tablet Take 1 tablet (800 mg total) by mouth 3 (three) times daily.  . irbesartan-hydrochlorothiazide (AVALIDE) 300-12.5 MG tablet TAKE 1 TABLET BY  MOUTH DAILY  . isosorbide mononitrate (IMDUR) 30 MG 24 hr tablet TAKE 1 TABLET(30 MG) BY MOUTH DAILY  . nitroGLYCERIN (NITROSTAT) 0.4 MG SL tablet PLACE 1 TABLET UNDER THE TONGUE EVERY 5 MINURES FOR 3 DOSES AS NEEDED FOR CHEST PAIN  . ranitidine (ZANTAC) 150 MG tablet Take 1 tablet (150 mg total) by mouth 2 (two) times daily.  [name] umeclidinium-vilanterol (ANORO ELLIPTA) 62.5-25 MCG/INH AEPB Inhale 1 puff into the lungs daily.    No Known Allergies  Social History   Tobacco Use  . Smoking status: Former Smoker    Packs/day: 0.50    Years: 25.00    Pack years: 12.50    Last attempt to quit: 01/21/2016    Years since quitting: 1.4  . Smokeless tobacco: Never Used  Substance Use Topics  . Alcohol use: Yes    Alcohol/week: 1.2 oz    Types: 2 Cans of beer per week    Comment: twice weekly  . Drug use: Yes    Comment: uses cocaine and marijuana regularly.   Social History   Social History Narrative   He does get somes some exercise with walking 1- 1 1/2 miles /day, currently painting cars part time.    Lives with girlfriend and 40 year old son    family history includes Cancer in his sister; Heart disease in his father and sister; Hypertension in his father, mother, sister, sister, and sister; Stroke in his sister.  Wt Readings from Last 3 Encounters:  07/10/17 189 lb (85.7 kg)  07/02/17 189 lb 3.2 oz (85.8 kg)  01/24/17 192 lb (87.1 kg)    PHYSICAL EXAM BP (!) 142/76 (BP Location: Right Arm, Patient Position: Sitting, Cuff Size: Normal)   Pulse 67   Ht 5\' 7"  (1.702 m)   Wt 189 lb (85.7 kg)   BMI 29.60 kg/m  Physical Exam  Constitutional: He is oriented to person, place, and time. He appears well-developed and well-nourished. No distress.  Borderline obese.  Well-groomed  HENT:  Head: Normocephalic and atraumatic.  Mouth/Throat: No oropharyngeal exudate.  Eyes: Conjunctivae and EOM are normal. Pupils are equal, round, and reactive to light.  His right eye is slightly deeper inset compared to the left, but no facial droop noted.  Neck: Normal range of motion. Neck supple. No hepatojugular reflux and no JVD present. Carotid bruit is not present. No tracheal deviation present. No thyromegaly present.  Cardiovascular: Normal rate, regular rhythm, normal heart sounds and intact distal pulses.  No extrasystoles are present. PMI is not displaced. Exam reveals no gallop and no friction rub.  No murmur heard. Pulmonary/Chest: Effort normal and breath sounds normal. No respiratory distress. He has no wheezes. He has no rales.  Abdominal: Soft. Bowel sounds are normal. He exhibits no distension. There is no tenderness. There is no rebound.  Musculoskeletal: Normal range of motion. He exhibits no edema.  Neurological: He is alert and oriented to person, place, and time. No cranial nerve deficit.  Skin: Skin is warm and dry. No rash noted. No erythema.  Psychiatric: He has a normal mood and affect. His behavior is normal. Judgment and thought content normal.  Nursing note and vitals  reviewed.   Adult ECG Report  Rate: 67 ;  Rhythm: normal sinus rhythm;   Narrative Interpretation:  normal EKG   Other studies Reviewed: The monitor Additional studies/ records that were reviewed today include:  Recent Labs:    Lab Results  Component Value Date   CHOL 136 01/20/2017  HDL 44 01/20/2017   LDLCALC 49 07/25/2015   TRIG 214 (H) 01/20/2017   CHOLHDL 3.1 01/20/2017    ASSESSMENT / PLAN: Problem List Items Addressed This Visit    NSTEMI (non-ST elevated myocardial infarction) (HCC) (Chronic)    Normal wall motion by echo.  No heart failure symptoms.  No further anginal symptoms since his MI.  Culprit lesion was likely the distal circumflex lesion, however the OM lesion was also significantly stenosed.      Relevant Orders   EKG 12-Lead   Hyperlipidemia with target low density lipoprotein (LDL) cholesterol less than 70 mg/dL (Chronic)    LDL looks great on current dose of atorvastatin.  Continue to monitor.  Triglycerides a little high.  We discussed different changes in diet.      Relevant Orders   EKG 12-Lead   Former smoker    Off and on smoker.  Hopefully he will follow-up finally quit.  He has been pretty good for the last 9 months.      Essential hypertension (Chronic)    Blood pressure actually looks pretty good today.  I do not really know what also been do besides add Hydralazine.  We will see how he does and closely monitor.        Relevant Orders   EKG 12-Lead   CAD S/P percutaneous coronary angioplasty - Primary (Chronic)    MI back in 2015 with stent to the distal circumflex and OM1 with DES stents.  He remains on Plavix.  He is on aspirin as well.  For antianginal he is on carvedilol and amlodipine at max dose.  He is also on max dose Avalide. He has not had any further symptoms and I do not really see the need for her unless his blood pressure is high.  We will stop Imdur for now. Continue statin.      Relevant Orders   EKG 12-Lead       Current medicines are reviewed at length with the patient today. (+/- concerns) none The following changes have been made:Okay to stop Imdur  Patient Instructions  Medication Instructions:  Ok to stop isosorbide (Imdur) for 2 weeks, if you do not experience chest pain, ok to stop.  Follow-Up: Your physician wants you to follow-up in: 12 months with Dr. Ellyn Hack.  You will receive a reminder letter in the mail two months in advance. If you don't receive a letter, please call our office to schedule the follow-up appointment.   Any Other Special Instructions Will Be Listed Below (If Applicable).     If you need a refill on your cardiac medications before your next appointment, please call your pharmacy.     Studies Ordered:   Orders Placed This Encounter  Procedures  . EKG 12-Lead      [name], M.D., M.S. Interventional Cardiologist   Pager # [phone] Phone # [phone] [address] [place], [place] [address]   Thank you for choosing [facility]!!

## 2017-07-10 NOTE — Patient Instructions (Addendum)
Medication Instructions:  Ok to stop isosorbide (Imdur) for 2 weeks, if you do not experience chest pain, ok to stop.  Follow-Up: Your physician wants you to follow-up in: 12 months with Dr. Ellyn Hack.  You will receive a reminder letter in the mail two months in advance. If you don't receive a letter, please call our office to schedule the follow-up appointment.   Any Other Special Instructions Will Be Listed Below (If Applicable).     If you need a refill on your cardiac medications before your next appointment, please call your pharmacy.

## 2017-07-12 ENCOUNTER — Encounter: Payer: Self-pay | Admitting: Cardiology

## 2017-07-12 NOTE — Assessment & Plan Note (Signed)
Blood pressure actually looks pretty good today.  I do not really know what also been do besides add Hydralazine.  We will see how he does and closely monitor.

## 2017-07-12 NOTE — Assessment & Plan Note (Signed)
Normal wall motion by echo.  No heart failure symptoms.  No further anginal symptoms since his MI.  Culprit lesion was likely the distal circumflex lesion, however the OM lesion was also significantly stenosed.

## 2017-07-12 NOTE — Assessment & Plan Note (Signed)
MI back in 2015 with stent to the distal circumflex and OM1 with DES stents.  He remains on Plavix.  He is on aspirin as well.  For antianginal he is on carvedilol and amlodipine at max dose.  He is also on max dose Avalide. He has not had any further symptoms and I do not really see the need for her unless his blood pressure is high.  We will stop Imdur for now. Continue statin.

## 2017-07-12 NOTE — Assessment & Plan Note (Signed)
LDL looks great on current dose of atorvastatin.  Continue to monitor.  Triglycerides a little high.  We discussed different changes in diet.

## 2017-07-12 NOTE — Assessment & Plan Note (Signed)
Off and on smoker.  Hopefully he will follow-up finally quit.  He has been pretty good for the last 9 months.

## 2017-07-20 ENCOUNTER — Other Ambulatory Visit: Payer: Self-pay | Admitting: Medical

## 2017-07-21 NOTE — Telephone Encounter (Signed)
LOV-04/08/16 Okay to refill?  Thanks!

## 2017-08-27 ENCOUNTER — Other Ambulatory Visit: Payer: Self-pay | Admitting: Medical

## 2017-08-28 ENCOUNTER — Other Ambulatory Visit: Payer: Self-pay | Admitting: Medical

## 2017-09-19 ENCOUNTER — Other Ambulatory Visit: Payer: Self-pay | Admitting: Medical

## 2017-10-14 ENCOUNTER — Other Ambulatory Visit: Payer: Self-pay | Admitting: Medical

## 2017-10-28 ENCOUNTER — Ambulatory Visit: Payer: Medicare HMO | Admitting: Medical

## 2017-11-02 ENCOUNTER — Ambulatory Visit: Payer: Medicare HMO | Admitting: Medical

## 2017-11-09 ENCOUNTER — Ambulatory Visit: Payer: Medicare HMO | Admitting: Medical

## 2017-11-13 ENCOUNTER — Other Ambulatory Visit: Payer: Self-pay | Admitting: Medical

## 2017-11-13 NOTE — Telephone Encounter (Signed)
Is this ok to refill?    2 cancelled appt and 1 no show

## 2017-11-13 NOTE — Telephone Encounter (Signed)
I sent 30 day, but get in for med check or CPX

## 2017-11-13 NOTE — Telephone Encounter (Signed)
Is this ok to refill?   2 cancelled appt by patient and 1 no show.

## 2017-11-16 ENCOUNTER — Telehealth: Payer: Self-pay

## 2017-11-16 NOTE — Telephone Encounter (Signed)
Left message for patient to call back to schedule CPE appointment.

## 2017-12-13 ENCOUNTER — Other Ambulatory Visit: Payer: Self-pay | Admitting: Medical

## 2017-12-15 ENCOUNTER — Encounter

## 2018-01-12 ENCOUNTER — Other Ambulatory Visit: Payer: Self-pay | Admitting: Medical

## 2018-01-27 ENCOUNTER — Ambulatory Visit: Payer: Self-pay | Admitting: Medical

## 2018-01-27 DIAGNOSIS — Z0000 Encounter for general adult medical examination without abnormal findings: Secondary | ICD-10-CM

## 2018-02-01 ENCOUNTER — Telehealth: Payer: Self-pay | Admitting: Medical

## 2018-02-01 NOTE — Telephone Encounter (Signed)
He no showed last weeks physical appt.  Please bill for no show, and I may have already gotten something on him a few weeks ago about dismissal.   So may be time to dismiss

## 2018-02-05 ENCOUNTER — Encounter: Payer: Self-pay | Admitting: Medical

## 2018-02-05 NOTE — Telephone Encounter (Signed)
2nd no show letter sent

## 2018-02-11 ENCOUNTER — Other Ambulatory Visit: Payer: Self-pay | Admitting: Medical

## 2018-02-24 ENCOUNTER — Encounter: Payer: Self-pay | Admitting: Medical

## 2018-02-24 ENCOUNTER — Ambulatory Visit (INDEPENDENT_AMBULATORY_CARE_PROVIDER_SITE_OTHER): Payer: Medicare HMO | Admitting: Medical

## 2018-02-24 VITALS — BP 140/90 | HR 64 | Temp 98.0°F | Resp 16 | Ht 67.0 in | Wt 179.2 lb

## 2018-02-24 DIAGNOSIS — I10 Essential (primary) hypertension: Secondary | ICD-10-CM | POA: Diagnosis not present

## 2018-02-24 DIAGNOSIS — R6889 Other general symptoms and signs: Secondary | ICD-10-CM

## 2018-02-24 DIAGNOSIS — M542 Cervicalgia: Secondary | ICD-10-CM

## 2018-02-24 DIAGNOSIS — I2510 Atherosclerotic heart disease of native coronary artery without angina pectoris: Secondary | ICD-10-CM

## 2018-02-24 DIAGNOSIS — R942 Abnormal results of pulmonary function studies: Secondary | ICD-10-CM

## 2018-02-24 DIAGNOSIS — Z0000 Encounter for general adult medical examination without abnormal findings: Secondary | ICD-10-CM

## 2018-02-24 DIAGNOSIS — Z8619 Personal history of other infectious and parasitic diseases: Secondary | ICD-10-CM | POA: Diagnosis not present

## 2018-02-24 DIAGNOSIS — R7301 Impaired fasting glucose: Secondary | ICD-10-CM | POA: Diagnosis not present

## 2018-02-24 DIAGNOSIS — Z87891 Personal history of nicotine dependence: Secondary | ICD-10-CM | POA: Diagnosis not present

## 2018-02-24 DIAGNOSIS — I252 Old myocardial infarction: Secondary | ICD-10-CM | POA: Diagnosis not present

## 2018-02-24 DIAGNOSIS — Z9861 Coronary angioplasty status: Secondary | ICD-10-CM

## 2018-02-24 DIAGNOSIS — Z129 Encounter for screening for malignant neoplasm, site unspecified: Secondary | ICD-10-CM

## 2018-02-24 DIAGNOSIS — Z23 Encounter for immunization: Secondary | ICD-10-CM

## 2018-02-24 DIAGNOSIS — Z7189 Other specified counseling: Secondary | ICD-10-CM

## 2018-02-24 DIAGNOSIS — E785 Hyperlipidemia, unspecified: Secondary | ICD-10-CM

## 2018-02-24 DIAGNOSIS — K219 Gastro-esophageal reflux disease without esophagitis: Secondary | ICD-10-CM

## 2018-02-24 DIAGNOSIS — Z7185 Encounter for immunization safety counseling: Secondary | ICD-10-CM

## 2018-02-24 DIAGNOSIS — Z122 Encounter for screening for malignant neoplasm of respiratory organs: Secondary | ICD-10-CM

## 2018-02-24 DIAGNOSIS — K7460 Unspecified cirrhosis of liver: Secondary | ICD-10-CM

## 2018-02-24 LAB — POCT URINALYSIS DIP (PROADVANTAGE DEVICE)
BILIRUBIN UA: NEGATIVE mg/dL
Bilirubin, UA: NEGATIVE
Blood, UA: NEGATIVE
Glucose, UA: NEGATIVE mg/dL
Leukocytes, UA: NEGATIVE
Nitrite, UA: NEGATIVE
PROTEIN UA: NEGATIVE mg/dL
SPECIFIC GRAVITY, URINE: 1.02
Urobilinogen, Ur: NEGATIVE
pH, UA: 6 (ref 5.0–8.0)

## 2018-02-24 NOTE — Progress Notes (Signed)
Subjective:    Jay Fuller is a 62 y.o. male who presents for Preventative Services visit and chronic medical problems/med check visit.    Primary Care Provider Tysinger, Camelia Eng, PA-C here for primary care  Current Health Care Team:  Sees dentist, eye doctor  Cardiology, Dr. Orlene Och, PA and Dr. Bjorn Pippin with [facility] care center  Medical Services you may have received from other than [facility] providers in the past year (date may be approximate) Hepatitis clinic  Exercise Current exercise habits: walking daily   Nutrition/Diet Current diet: eat less fat, salt and no fast food  Depression Screen Depression screen [facility] 2/9 02/24/2018  Decreased Interest 0  Down, Depressed, Hopeless 0  PHQ - 2 Score 0    Activities of Daily Living Screen/Functional Status Survey Is the patient deaf or have difficulty hearing?: No Does the patient have difficulty seeing, even when wearing glasses/contacts?: No Does the patient have difficulty concentrating, remembering, or making decisions?: Yes Does the patient have difficulty walking or climbing stairs?: No Does the patient have difficulty dressing or bathing?: No Does the patient have difficulty doing errands alone such as visiting a doctor's office or shopping?: Yes   Fall Risk Screen Fall Risk  02/24/2018 01/20/2017  Falls in the past year? No No    Gait Assessment: Normal gait observed {Yes   Advanced directives Does patient have a [place]? No  Does patient have a Living Will? No   Past Medical History:  Diagnosis Date  . CAD S/P percutaneous coronary angioplasty 06/2013   a. NSTEMI/Cath/PCI: LM nl, dLAD 20-40%, Diags small with ~95%; pLCx 20%, dLCx 99% * (DES PCI - 2.25x16 Promus Premier DES) & pOM1 80-90% (DES PCI: 2.5x20 Promus Premier DES), OM2 min irregs, LPL1 small, min irregs, RCA/RPDA min irregs, RPL nl, EF 55%.  . Chronic pain   . 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 Hepatitis B immune 2016  . Hepatitis C    s/p Harvoni therapy 2015  . History of NON-ST ELEVATION MI 06/2013   99% dLCx (DES PCI); 80-90% OM1 (DES PCI) --normal EF.  Normal wall motion.;   . HTN (hypertension)   . Hyperlipidemia   . Polysubstance abuse ([place])    history of cocaine, marijuana, tobacco abuse in the use  . Tobacco abuse    quit 06/2016.  has 12 pack year history (quit 2015 - had relapse, but now quit)  . Wears glasses     Past Surgical History:  Procedure Laterality Date  . COLONOSCOPY     denied, but Cologuard negative 12/2013  . CORONARY ANGIOPLASTY WITH STENT PLACEMENT  06/2013    dLCx 99% going into LPL1 (DES PCI: 2.25x16 Promus Premier DES), p OM1 80-90% (DES PCI - 2.5x20 Promus Premier DES)  . LEFT HEART CATHETERIZATION WITH CORONARY ANGIOGRAM N/A 06/23/2013   Procedure: LEFT HEART CATHETERIZATION WITH CORONARY ANGIOGRAM;  Surgeon: Leonie Man, MD;  Location: [facility] CATH LAB:: LM nl, d LAD 20-40%, small caliber Diags w/ severel ~95% lesions, p LCX 20%, dLCx 99% going into LPL1 (PCI- DES), p OM1 80-90% (PCI -DES), OM2 min irregs, LPL1 small, min irregs, RCA/RPDA min irregs, RPL nl, EF 55%.  [name] LIVER BIOPSY  2016  . TRANSTHORACIC ECHOCARDIOGRAM  06/2013   Normal LV size and moderate thickness/moderate LVH.  Normal EF 55-60%. No RWMA. Gr 1 DD. Mlid LA dilation    Social History  Socioeconomic History  . Marital status: Single    Spouse name: Not on file  . Number of children: Not on file  . Years of education: Not on file  . Highest education level: Not on file  Occupational History  . Not on file  Social Needs  . Financial resource strain: Not on file  . Food insecurity:    Worry: Not on file    Inability: Not on file  . Transportation needs:    Medical: Not on file    Non-medical: Not on file  Tobacco Use  . Smoking status: Former Smoker    Packs/day: 0.50    Years: 25.00    Pack years: 12.50    Last attempt to quit:  01/21/2016    Years since quitting: 2.0  . Smokeless tobacco: Never Used  Substance and Sexual Activity  . Alcohol use: Yes    Alcohol/week: 2.0 standard drinks    Types: 2 Cans of beer per week    Comment: twice weekly  . Drug use: Yes    Comment: uses cocaine and marijuana regularly.  [name] Sexual activity: Yes  Lifestyle  . Physical activity:    Days per week: Not on file    Minutes per session: Not on file  . Stress: Not on file  Relationships  . Social connections:    Talks on phone: Not on file    Gets together: Not on file    Attends religious service: Not on file    Active member of club or organization: Not on file    Attends meetings of clubs or organizations: Not on file    Relationship status: Not on file  . Intimate partner violence:    Fear of current or ex partner: Not on file    Emotionally abused: Not on file    Physically abused: Not on file    Forced sexual activity: Not on file  Other Topics Concern  . Not on file  Social History Narrative   He does get somes some exercise with walking 1- 1 1/2 miles /day, currently painting cars part time.   Lives with girlfriend and 49 year old son    Family History  Problem Relation Age of Onset  . Hypertension Mother   . Heart disease Father   . Hypertension Father   . Hypertension Sister   . Stroke Sister   . Hypertension Sister   . Cancer Sister        pancreatic  . Heart disease Sister        CHF  . Hypertension Sister   . Diabetes Neg Hx      Current Outpatient Medications:  .  amLODipine (NORVASC) 10 MG tablet, TAKE 1 TABLET(10 MG) BY MOUTH DAILY, Disp: 30 tablet, Rfl: 0 .  aspirin 81 MG chewable tablet, One by mouth daily., Disp: 90 tablet, Rfl: 3 .  atorvastatin (LIPITOR) 20 MG tablet, TAKE 1 TABLET(20 MG) BY MOUTH DAILY, Disp: 30 tablet, Rfl: 0 .  carvedilol (COREG) 25 MG tablet, TAKE 1 TABLET(25 MG) BY MOUTH TWICE DAILY WITH A MEAL, Disp: 60 tablet, Rfl: 0 .  clopidogrel (PLAVIX) 75 MG tablet, TAKE  1 TABLET(75 MG) BY MOUTH DAILY, Disp: 30 tablet, Rfl: 0 .  diphenhydrAMINE (SOMINEX) 25 MG tablet, Take 25 mg by mouth at bedtime as needed for sleep., Disp: , Rfl:  .  ibuprofen (ADVIL,MOTRIN) 800 MG tablet, Take 1 tablet (800 mg total) by mouth 3 (three) times daily., Disp: 21 tablet, Rfl: 0 .  irbesartan-hydrochlorothiazide (AVALIDE) 300-12.5 MG tablet, TAKE 1 TABLET BY MOUTH DAILY, Disp: 30 tablet, Rfl: 0 .  isosorbide mononitrate (IMDUR) 30 MG 24 hr tablet, TAKE 1 TABLET(30 MG) BY MOUTH DAILY, Disp: 30 tablet, Rfl: 0 .  nitroGLYCERIN (NITROSTAT) 0.4 MG SL tablet, PLACE 1 TABLET UNDER THE TONGUE EVERY 5 MINURES FOR 3 DOSES AS NEEDED FOR CHEST PAIN, Disp: 150 tablet, Rfl: 0 .  ranitidine (ZANTAC) 150 MG tablet, Take 1 tablet (150 mg total) by mouth 2 (two) times daily., Disp: 60 tablet, Rfl: 0 .  umeclidinium-vilanterol (ANORO ELLIPTA) 62.5-25 MCG/INH AEPB, Inhale 1 puff into the lungs daily., Disp: 60 each, Rfl: 0  No Known Allergies  History reviewed: allergies, current medications, past family history, past medical history, past social history, past surgical history and problem list  Chronic issues discussed: HTN - taking Amlodipine 10mg  daily, Coreg 25mg  BID, Irbesartan HCT 300/12.5mg  daily.   Checks BPs, gets 140/90s.    Hyperlipidemia - taking Lipitor 20mg  and Aspirin 81mg  daily for cholesterol, but last cardiology office visit was advised to stop aspirin  Heart disease - still taking Plavix 75mg  daily, Imdur 30mg  daily,   GERD - still taking zantac  COPD - taking Anoro daily.   No SOB, no wheezing.  Has some improvements with exercise tolerance.   No cough.   Former smoker.  Stopped ibuprofen  Hx/o hep C - last saw them 2018.   Acute issues discussed: Sometimes gets pain in right neck, intermittent without injury or trauma.    Objective:      Biometrics BP 140/90   Pulse 64   Temp 98 F (36.7 C) (Oral)   Resp 16   Ht 5\' 7"  (1.702 m)   Wt 179 lb 3.2 oz (81.3 kg)    SpO2 98%   BMI 28.07 kg/m   Wt Readings from Last 3 Encounters:  02/24/18 179 lb 3.2 oz (81.3 kg)  07/10/17 189 lb (85.7 kg)  07/02/17 189 lb 3.2 oz (85.8 kg)   BP Readings from Last 3 Encounters:  02/24/18 140/90  07/10/17 (!) 142/76  07/02/17 (!) 148/86    Gen: wd, wn, nad, AA male HEENT: normocephalic, sclerae anicteric, TMs pearly, nares patent, no discharge or erythema, pharynx normal Oral cavity: MMM, no lesions Neck: supple, no lymphadenopathy, no thyromegaly, no masses, no bruit Heart: RRR, normal S1, S2, no murmurs Lungs: decreased breath sounds, no wheezes, rhonchi, or rales Abdomen: +bs, soft, non tender, non distended, no masses, no hepatomegaly, no splenomegaly Musculoskeletal: nontender, no swelling, no obvious deformity Extremities: no edema, no cyanosis, no clubbing Pulses: 2+ symmetric, upper and lower extremities, normal cap refill Neurological: alert, oriented x 3, CN2-12 intact, strength normal upper extremities and lower extremities, sensation normal throughout, DTRs 2+ throughout, no cerebellar signs, gait normal Psychiatric: normal affect, behavior normal, pleasant  GU/rectal - deferred    Assessment:   Encounter Diagnoses  Name Primary?  . Routine general medical examination at a health care facility Yes  . CAD S/P percutaneous coronary angioplasty   . Essential hypertension   . Cirrhosis of liver without ascites, unspecified hepatic cirrhosis type ([place])   . History of hepatitis C   . History of MI (myocardial infarction)   . Impaired fasting blood sugar   . Former smoker   . Hyperlipidemia with target low density lipoprotein (LDL) cholesterol less than 70 mg/dL   . Medicare annual wellness visit, subsequent   . Need for influenza vaccination   . Abnormal PFT   .  Gastroesophageal reflux disease, esophagitis presence not specified   . Vaccine counseling   . Exercise intolerance   . Screening for cancer   . Neck pain   . Needs flu shot   .  Encounter for screening for malignant neoplasm of respiratory organs      Plan:   A preventative services visit was completed today.  During the course of the visit today, we discussed and counseled about appropriate screening and preventive services.  A health risk assessment was established today that included a review of current medications, allergies, social history, family history, medical and preventative health history, biometrics, and preventative screenings to identify potential safety concerns or impairments.  A personalized plan was printed today for your records and use.   Personalized health advice and education was given today to reduce health risks and promote self management and wellness.  Information regarding end of life planning was discussed today.  Chronic problems discussed today: History of tobacco use for 30+ years, supposedly quit 2 months ago.  Advised CT chest lung cancer screening, PFT reviewed today. advised he remain tobacco free.    History of hepatitis C- I reviewed the last note I have from hepatitis clinic from February 2017.  History of hepatitis C genotype 1 cured after treatment, their recommendations were liver ultrasound, advised to have an upper endoscopy.  He reports that he is seeing them within the past 6 months.  To my knowledge she has never had EGD or colonoscopy as a modified he has refused colonoscopy.  We discussed this today and I recommend he do follow-ups with hepatitis clinic as well as have a GI consult.  He will let me know   History of heart disease, history of MI, hyperlipidemia- continue current medications, labs today  GERD-given the recent concerns or ranitidine safety advised he stop this and either use trigger food avoidance or consider PPI  Impaired glucose-labs today   I recommend aortic abdominal aneurysm screening.  He will let me know if agreeable  Acute problems discussed today: Neck pain-exam normal, may be just inflammation  from muscle strain, advised him to give this time to resolve over the next few days and if not recheck   Recommendations:  I recommend a yearly ophthalmology/optometry visit for glaucoma screening and eye checkup  I recommended a yearly dental visit for hygiene and checkup  Advanced directives - discussed nature and purpose of Advanced Directives, encouraged them to complete them if they have not done so and/or encouraged them to get [place] a copy if they have done this already.   Referrals today: Possibly GI  Immunizations: Counseled on the influenza virus vaccine.  Vaccine information sheet given.  Influenza vaccine given after consent obtained.  Shingles vaccine:  I recommend you have a shingles vaccine to help prevent shingles or herpes zoster outbreak.   Please call your insurer to inquire about coverage for the Shingrix vaccine given in 2 doses.   Some insurers cover this vaccine after age 3, some cover this after age 1.  If your insurer covers this, then call to schedule appointment to have this vaccine here.  David was seen today for annual exam.  Diagnoses and all orders for this visit:  Routine general medical examination at a health care facility -     POCT Urinalysis DIP (Proadvantage Device) -     CBC with Differential/Platelet -     Lipid panel -     Hemoglobin A1c -     Renal  Function Panel -     Hepatic function panel -     US Abdomen Complete; Future -     US ABDOMINAL AORTA SCREENING AAA; Future  CAD S/P percutaneous coronary angioplasty -     US Abdomen Complete; Future -     US ABDOMINAL AORTA SCREENING AAA; Future  Essential hypertension -     CBC with Differential/Platelet -     Renal Function Panel -     Hepatic function panel -     US Abdomen Complete; Future -     US ABDOMINAL AORTA SCREENING AAA; Future  Cirrhosis of liver without ascites, unspecified hepatic cirrhosis type ([place]) -     CBC with Differential/Platelet -     Renal Function  Panel -     Hepatic function panel -     US Abdomen Complete; Future  History of hepatitis C -     US Abdomen Complete; Future  History of MI (myocardial infarction)  Impaired fasting blood sugar -     Hemoglobin A1c  Former smoker -     Spirometry with graph  Hyperlipidemia with target low density lipoprotein (LDL) cholesterol less than 70 mg/dL -     Lipid panel  Medicare annual wellness visit, subsequent  Need for influenza vaccination  Abnormal PFT -     Spirometry with graph  Gastroesophageal reflux disease, esophagitis presence not specified  Vaccine counseling  Exercise intolerance -     Spirometry with graph  Screening for cancer  Neck pain  Needs flu shot -     Flu Vaccine QUAD 36+ mos IM  Encounter for screening for malignant neoplasm of respiratory organs -     CT CHEST LUNG CA SCREEN LOW DOSE W/O CM; Future    Medicare Attestation A preventative services visit was completed today.  During the course of the visit the patient was educated and counseled about appropriate screening and preventive services.  A health risk assessment was established with the patient that included a review of current medications, allergies, social history, family history, medical and preventative health history, biometrics, and preventative screenings to identify potential safety concerns or impairments.  A personalized plan was printed today for the patient's records and use.   Personalized health advice and education was given today to reduce health risks and promote self management and wellness.  Information regarding end of life planning was discussed today.  Dorothea Ogle, PA-C   02/24/2018

## 2018-02-24 NOTE — Patient Instructions (Signed)
Here are some recommendations and summary of what we discussed today:  Screenings:  Given your age and history of tobacco use I recommend an ultrasound to screen for abdominal aortic aneurysm.  If agreeable let me know and we will set this up  Given your history of cirrhosis and liver disease you are supposed to have an abdominal ultrasound periodically to check the liver.  I do not have one on file since 2016.  We need to get this done so let me know if agreeable  Given your smoking history I recommend a chest CT lung cancer screening.  Let me know if agreeable and we will set this up   Vaccines:  We updated your flu shot today  Shingles vaccine:  I recommend you have a shingles vaccine to help prevent shingles or herpes zoster outbreak.   Please call your insurer to inquire about coverage for the Shingrix vaccine given in 2 doses.   Some insurers cover this vaccine after age 49, some cover this after age 57.  If your insurer covers this, then call to schedule appointment to have this vaccine here.   Continue to see your cardiologist regularly per their recommendation  Continue to see your hepatitis clinic regularly which I assume is every 6 to 12 months  Your breathing test is abnormal today, and given your history of smoking this likely suggest COPD or chronic obstructive lung disease.  I recommend you refrain from  smoking completely.  Since you are not getting improvements with Anoro pending labs I may change to different medication  Acid reflux  There has recently been concerns about ranitidine/Zantac and cancer risk.  Please stop this in either avoid acid reflux causing foods such as spicy and acidic foods or you can use some over-the-counter Prilosec as needed

## 2018-02-25 ENCOUNTER — Other Ambulatory Visit: Payer: Self-pay | Admitting: Medical

## 2018-02-25 LAB — CBC WITH DIFFERENTIAL/PLATELET
BASOS ABS: 0 10*3/uL (ref 0.0–0.2)
Basos: 0 %
EOS (ABSOLUTE): 0.1 10*3/uL (ref 0.0–0.4)
Eos: 2 %
Hematocrit: 45 % (ref 37.5–51.0)
Hemoglobin: 14.7 g/dL (ref 13.0–17.7)
Immature Grans (Abs): 0 10*3/uL (ref 0.0–0.1)
Immature Granulocytes: 0 %
LYMPHS ABS: 2.4 10*3/uL (ref 0.7–3.1)
Lymphs: 43 %
MCH: 27.1 pg (ref 26.6–33.0)
MCHC: 32.7 g/dL (ref 31.5–35.7)
MCV: 83 fL (ref 79–97)
MONOCYTES: 8 %
MONOS ABS: 0.4 10*3/uL (ref 0.1–0.9)
Neutrophils Absolute: 2.6 10*3/uL (ref 1.4–7.0)
Neutrophils: 47 %
Platelets: 219 10*3/uL (ref 150–450)
RBC: 5.43 x10E6/uL (ref 4.14–5.80)
RDW: 14.9 % (ref 12.3–15.4)
WBC: 5.6 10*3/uL (ref 3.4–10.8)

## 2018-02-25 LAB — HEPATIC FUNCTION PANEL
ALT: 14 IU/L (ref 0–44)
AST: 20 IU/L (ref 0–40)
Alkaline Phosphatase: 94 IU/L (ref 39–117)
BILIRUBIN TOTAL: 0.3 mg/dL (ref 0.0–1.2)
Bilirubin, Direct: 0.13 mg/dL (ref 0.00–0.40)
TOTAL PROTEIN: 7 g/dL (ref 6.0–8.5)

## 2018-02-25 LAB — RENAL FUNCTION PANEL
Albumin: 4.5 g/dL (ref 3.6–4.8)
BUN / CREAT RATIO: 12 (ref 10–24)
BUN: 12 mg/dL (ref 8–27)
CALCIUM: 9.6 mg/dL (ref 8.6–10.2)
CHLORIDE: 104 mmol/L (ref 96–106)
CO2: 25 mmol/L (ref 20–29)
CREATININE: 1.02 mg/dL (ref 0.76–1.27)
GFR calc Af Amer: 91 mL/min/{1.73_m2} (ref >59)
GFR calc non Af Amer: 78 mL/min/{1.73_m2} (ref >59)
GLUCOSE: 133 mg/dL — AB (ref 65–99)
PHOSPHORUS: 3.5 mg/dL (ref 2.5–4.5)
POTASSIUM: 4 mmol/L (ref 3.5–5.2)
SODIUM: 142 mmol/L (ref 134–144)

## 2018-02-25 LAB — LIPID PANEL
CHOLESTEROL TOTAL: 156 mg/dL (ref 100–199)
Chol/HDL Ratio: 3.3 ratio (ref 0.0–5.0)
HDL: 48 mg/dL (ref >39)
LDL Calculated: 82 mg/dL (ref 0–99)
TRIGLYCERIDES: 129 mg/dL (ref 0–149)
VLDL Cholesterol Cal: 26 mg/dL (ref 5–40)

## 2018-02-25 LAB — HEMOGLOBIN A1C
ESTIMATED AVERAGE GLUCOSE: 131 mg/dL
Hgb A1c MFr Bld: 6.2 % — ABNORMAL HIGH (ref 4.8–5.6)

## 2018-02-25 MED ORDER — UMECLIDINIUM-VILANTEROL 62.5-25 MCG/INH IN AEPB: 1.0000 | INHALATION_SPRAY | Freq: Every day | RESPIRATORY_TRACT | 11 refills | Status: DC

## 2018-02-25 MED ORDER — ATORVASTATIN CALCIUM 20 MG PO TABS: 20.0000 mg | ORAL_TABLET | Freq: Every day | ORAL | 3 refills | Status: DC

## 2018-02-25 MED ORDER — CARVEDILOL 25 MG PO TABS: ORAL_TABLET | ORAL | 3 refills | Status: DC

## 2018-02-25 MED ORDER — CLOPIDOGREL BISULFATE 75 MG PO TABS: ORAL_TABLET | ORAL | 3 refills | Status: DC

## 2018-02-25 MED ORDER — IRBESARTAN-HYDROCHLOROTHIAZIDE 300-12.5 MG PO TABS: 1.0000 | ORAL_TABLET | Freq: Every day | ORAL | 3 refills | Status: DC

## 2018-02-25 MED ORDER — AMLODIPINE BESYLATE 10 MG PO TABS: 10.0000 mg | ORAL_TABLET | Freq: Every day | ORAL | 3 refills | Status: DC

## 2018-02-25 MED ORDER — NITROGLYCERIN 0.4 MG SL SUBL: SUBLINGUAL_TABLET | SUBLINGUAL | 0 refills | Status: AC

## 2018-02-25 MED ORDER — ISOSORBIDE MONONITRATE ER 30 MG PO TB24: ORAL_TABLET | ORAL | 3 refills | Status: DC

## 2018-03-09 ENCOUNTER — Ambulatory Visit: Payer: Medicare HMO

## 2018-03-09 ENCOUNTER — Other Ambulatory Visit: Payer: Medicare HMO

## 2018-03-11 ENCOUNTER — Ambulatory Visit
Admission: RE | Admit: 2018-03-11 | Discharge: 2018-03-11 | Disposition: A | Discharge status: Home / Self Care | Payer: Medicare HMO | Source: Ambulatory Visit | Attending: Medical | Admitting: Medical

## 2018-03-11 ENCOUNTER — Telehealth: Payer: Self-pay

## 2018-03-11 DIAGNOSIS — Z122 Encounter for screening for malignant neoplasm of respiratory organs: Secondary | ICD-10-CM

## 2018-03-11 NOTE — Telephone Encounter (Signed)
 Imaging called states patient does not qualify for low dose CT screening due to patient doesn't have a 30 pack year history.

## 2018-03-11 NOTE — Telephone Encounter (Signed)
That is not true, he has >30 pack year history!

## 2018-03-12 ENCOUNTER — Ambulatory Visit
Admission: RE | Admit: 2018-03-12 | Discharge: 2018-03-12 | Disposition: A | Discharge status: Home / Self Care | Payer: Medicare HMO | Source: Ambulatory Visit | Attending: Medical | Admitting: Medical

## 2018-03-12 DIAGNOSIS — K8020 Calculus of gallbladder without cholecystitis without obstruction: Secondary | ICD-10-CM | POA: Diagnosis not present

## 2018-03-12 DIAGNOSIS — I2510 Atherosclerotic heart disease of native coronary artery without angina pectoris: Secondary | ICD-10-CM

## 2018-03-12 DIAGNOSIS — Z0000 Encounter for general adult medical examination without abnormal findings: Secondary | ICD-10-CM

## 2018-03-12 DIAGNOSIS — Z9861 Coronary angioplasty status: Secondary | ICD-10-CM

## 2018-03-12 DIAGNOSIS — Z8619 Personal history of other infectious and parasitic diseases: Secondary | ICD-10-CM

## 2018-03-12 DIAGNOSIS — K7460 Unspecified cirrhosis of liver: Secondary | ICD-10-CM

## 2018-03-12 DIAGNOSIS — I10 Essential (primary) hypertension: Secondary | ICD-10-CM

## 2018-03-12 NOTE — Telephone Encounter (Signed)
If would like to go for chest xray as a way to evaluate the lungs, then we can do this.  Its not the accuracy of CT but may be helpful to make sure nothing worrisome

## 2018-03-12 NOTE — Telephone Encounter (Signed)
See prior message

## 2018-03-12 NOTE — Telephone Encounter (Signed)
He told  imaging that he did not have 30 pack

## 2018-03-15 ENCOUNTER — Other Ambulatory Visit: Payer: Self-pay | Admitting: Medical

## 2018-03-15 DIAGNOSIS — R918 Other nonspecific abnormal finding of lung field: Secondary | ICD-10-CM

## 2018-03-15 DIAGNOSIS — R942 Abnormal results of pulmonary function studies: Secondary | ICD-10-CM

## 2018-03-15 DIAGNOSIS — I2510 Atherosclerotic heart disease of native coronary artery without angina pectoris: Secondary | ICD-10-CM

## 2018-03-15 DIAGNOSIS — Z9861 Coronary angioplasty status: Principal | ICD-10-CM

## 2018-03-15 NOTE — Telephone Encounter (Signed)
Order in.     Please go to [facility] for your chest xray.   Their hours are 8am - 4:30 pm Monday - Friday.  Take your insurance card with you.  [facility] [phone]  [place] [address], [place], [place] [address]  [address] [place], [place] [address]

## 2018-03-15 NOTE — Telephone Encounter (Signed)
Patient notified and would like to go have Chest Xray done.

## 2018-03-15 NOTE — Telephone Encounter (Signed)
Patient has been informed of providers note and he stated he will go on tomorrow.

## 2018-04-05 ENCOUNTER — Ambulatory Visit
Admission: RE | Admit: 2018-04-05 | Discharge: 2018-04-05 | Disposition: A | Discharge status: Home / Self Care | Payer: Medicare HMO | Source: Ambulatory Visit | Attending: Medical | Admitting: Medical

## 2018-04-05 DIAGNOSIS — R942 Abnormal results of pulmonary function studies: Secondary | ICD-10-CM | POA: Diagnosis not present

## 2018-04-05 DIAGNOSIS — I2510 Atherosclerotic heart disease of native coronary artery without angina pectoris: Secondary | ICD-10-CM

## 2018-04-05 DIAGNOSIS — Z9861 Coronary angioplasty status: Principal | ICD-10-CM

## 2018-04-05 DIAGNOSIS — R918 Other nonspecific abnormal finding of lung field: Secondary | ICD-10-CM

## 2018-08-10 ENCOUNTER — Telehealth: Payer: Self-pay | Admitting: Medical

## 2018-08-10 NOTE — Telephone Encounter (Signed)
Needs virtual med check

## 2018-08-11 NOTE — Telephone Encounter (Signed)
Patient was driving, asked if I could call back.

## 2018-09-19 ENCOUNTER — Other Ambulatory Visit: Payer: Self-pay | Admitting: Medical

## 2018-09-22 ENCOUNTER — Other Ambulatory Visit: Payer: Self-pay | Admitting: Medical

## 2018-10-14 ENCOUNTER — Encounter: Payer: Self-pay | Admitting: Medical

## 2018-10-14 ENCOUNTER — Other Ambulatory Visit: Payer: Self-pay

## 2018-10-14 ENCOUNTER — Ambulatory Visit (INDEPENDENT_AMBULATORY_CARE_PROVIDER_SITE_OTHER): Payer: Medicare Other | Admitting: Medical

## 2018-10-14 ENCOUNTER — Telehealth: Payer: Self-pay | Admitting: Medical

## 2018-10-14 VITALS — BP 142/82 | HR 66 | Temp 98.3°F | Wt 181.0 lb

## 2018-10-14 DIAGNOSIS — R7301 Impaired fasting glucose: Secondary | ICD-10-CM | POA: Diagnosis not present

## 2018-10-14 DIAGNOSIS — Z8619 Personal history of other infectious and parasitic diseases: Secondary | ICD-10-CM | POA: Diagnosis not present

## 2018-10-14 DIAGNOSIS — I2510 Atherosclerotic heart disease of native coronary artery without angina pectoris: Secondary | ICD-10-CM

## 2018-10-14 DIAGNOSIS — I889 Nonspecific lymphadenitis, unspecified: Secondary | ICD-10-CM

## 2018-10-14 DIAGNOSIS — E785 Hyperlipidemia, unspecified: Secondary | ICD-10-CM

## 2018-10-14 DIAGNOSIS — R0683 Snoring: Secondary | ICD-10-CM

## 2018-10-14 DIAGNOSIS — I10 Essential (primary) hypertension: Secondary | ICD-10-CM | POA: Diagnosis not present

## 2018-10-14 DIAGNOSIS — R0681 Apnea, not elsewhere classified: Secondary | ICD-10-CM | POA: Insufficient documentation

## 2018-10-14 DIAGNOSIS — IMO0002 Reserved for concepts with insufficient information to code with codable children: Secondary | ICD-10-CM

## 2018-10-14 DIAGNOSIS — Z9861 Coronary angioplasty status: Secondary | ICD-10-CM

## 2018-10-14 DIAGNOSIS — R229 Localized swelling, mass and lump, unspecified: Secondary | ICD-10-CM

## 2018-10-14 NOTE — Progress Notes (Signed)
Subjective:  Jay Fuller is a 63 y.o. male who presents for Chief Complaint  Patient presents with  . other    growth under chin     Here today for concern about growth on the chin.  About 4 to 5 days ago he had a lump under his left chin but it has resolved as of today.  He denies fever, no teeth pain, no jaw pain, no skin lesion no rash no blister.  He has had small skin cyst before.  No weird taste in his mouth.  He is compliant with blood pressure medications.  No chest pain no shortness of breath no swelling in his legs.  He notes that is daughter says he quits breathing in his sleep, he reports non-restful sleep, not sure about snoring, he does endorse daytime somnolence  He has no other new complaint otherwise normal state of health  He exercises with walking and lifting some weights  No other aggravating or relieving factors.    No other c/o.  The following portions of the patient's history were reviewed and updated as appropriate: allergies, current medications, past family history, past medical history, past social history, past surgical history and problem list.  ROS Otherwise as in subjective above  Past Medical History:  Diagnosis Date  . CAD S/P percutaneous coronary angioplasty 06/2013   a. NSTEMI/Cath/PCI: LM nl, dLAD 20-40%, Diags small with ~95%; pLCx 20%, dLCx 99% * (DES PCI - 2.25x16 Promus Premier DES) & pOM1 80-90% (DES PCI: 2.5x20 Promus Premier DES), OM2 min irregs, LPL1 small, min irregs, RCA/RPDA min irregs, RPL nl, EF 55%.  . Chronic pain   . Cirrhosis ([place])    due to Hep C  . Hepatitis B immune 2016  . Hepatitis C    s/p Harvoni therapy 2015  . History of NON-ST ELEVATION MI 06/2013   99% dLCx (DES PCI); 80-90% OM1 (DES PCI) --normal EF.  Normal wall motion.;   . HTN (hypertension)   . Hyperlipidemia   . Polysubstance abuse ([place])    history of cocaine, marijuana, tobacco abuse in the use  . Tobacco abuse    quit 06/2016.  has 12 pack year  history (quit 2015 - had relapse, but now quit)  . Wears glasses    Current Outpatient Medications on File Prior to Visit  Medication Sig Dispense Refill  . amLODipine (NORVASC) 10 MG tablet Take 1 tablet (10 mg total) by mouth daily. 90 tablet 3  . aspirin 81 MG chewable tablet One by mouth daily. 90 tablet 3  . atorvastatin (LIPITOR) 20 MG tablet Take 1 tablet (20 mg total) by mouth daily at 6 PM. 90 tablet 3  . carvedilol (COREG) 25 MG tablet TAKE 1 TABLET(25 MG) BY MOUTH TWICE DAILY WITH A MEAL 180 tablet 3  . clopidogrel (PLAVIX) 75 MG tablet TAKE 1 TABLET(75 MG) BY MOUTH DAILY 90 tablet 3  . diphenhydrAMINE (SOMINEX) 25 MG tablet Take 25 mg by mouth at bedtime as needed for sleep.    [name] ibuprofen (ADVIL,MOTRIN) 800 MG tablet Take 1 tablet (800 mg total) by mouth 3 (three) times daily. 21 tablet 0  . irbesartan-hydrochlorothiazide (AVALIDE) 300-12.5 MG tablet TAKE 1 TABLET BY MOUTH DAILY 90 tablet 0  . isosorbide mononitrate (IMDUR) 30 MG 24 hr tablet TAKE 1 TABLET(30 MG) BY MOUTH DAILY 90 tablet 3  . nitroGLYCERIN (NITROSTAT) 0.4 MG SL tablet PLACE 1 TABLET UNDER THE TONGUE EVERY 5 MINURES FOR 3 DOSES AS NEEDED FOR CHEST PAIN  30 tablet 0  . umeclidinium-vilanterol (ANORO ELLIPTA) 62.5-25 MCG/INH AEPB Inhale 1 puff into the lungs daily. 60 each 11   No current facility-administered medications on file prior to visit.      Objective: BP (!) 142/82 (BP Location: Left Arm, Patient Position: Sitting)   Pulse 66   Temp 98.3 F (36.8 C)   Wt 181 lb (82.1 kg)   SpO2 95%   BMI 28.35 kg/m   General appearance: alert, no distress, well developed, well nourished HEENT: normocephalic, sclerae anicteric, conjunctiva pink and moist, TMs pearly, nares patent, no discharge or erythema, pharynx normal Oral cavity: MMM, no lesions Neck: supple,small unremarkable lymph node palpated anterior cervical bilat, otherwise no obvious lymphadenopathy, no thyromegaly, under his anterior left mandible is a  small 2-3 mm brown hard BB size nodule that he notes that is has been there unchanged for years, no other masses Heart: RRR, normal S1, S2, no murmurs Lungs: CTA bilaterally, no wheezes, rhonchi, or rales Abdomen: +bs, soft, non tender, non distended, no masses, no hepatomegaly, no splenomegaly Pulses: 2+ radial pulses, 2+ pedal pulses, normal cap refill Ext: no edema   Assessment: Encounter Diagnoses  Name Primary?  . Essential hypertension Yes  . CAD S/P percutaneous coronary angioplasty   . History of hepatitis C   . Impaired fasting blood sugar   . Hyperlipidemia with target low density lipoprotein (LDL) cholesterol less than 70 mg/dL   . Lymphadenitis   . Lump   . Snoring   . Witnessed apneic spells      Plan: Hypertension-not at goal despite current therapy.   Instead of adding Hydralazine, will set up for sleep study.   Continue current medication, advise he is due for follow-up with cardiology at this time  CAD-follow-up with cardiology  Hep C- status post treatment with Harvoni, cured.   Reviewed 2017 GI notes.  He is due back in follow up.   He is also due for EGD though GI.     Hyperlipidemia-continue current medication  I advised no obvious lump today although he does have some mild lymphadenitis.  Symptoms have mostly resolved.  No worrisome findings today  Snoring, witnessed apnea spells-referral for sleep study   Gaige was seen today for other.  Diagnoses and all orders for this visit:  Essential hypertension  CAD S/P percutaneous coronary angioplasty  History of hepatitis C  Impaired fasting blood sugar  Hyperlipidemia with target low density lipoprotein (LDL) cholesterol less than 70 mg/dL  Lymphadenitis  Lump  Snoring  Witnessed apneic spells   Follow up: pending study

## 2018-10-14 NOTE — Telephone Encounter (Signed)
See notes from  Thursday regarding sleep study.  He also needs referral back to GI for endoscopy due to history of liver disease.  He is due back routine follow up with hepatitis clinic as well.  Please remind him to schedule liver clinic follow up.

## 2018-10-18 ENCOUNTER — Other Ambulatory Visit: Payer: Self-pay

## 2018-10-18 DIAGNOSIS — Z8619 Personal history of other infectious and parasitic diseases: Secondary | ICD-10-CM

## 2018-10-18 NOTE — Telephone Encounter (Signed)
Patient has been referred to endo. Unable to reach patient at the number provided concerning following up with liver clinic.

## 2018-11-18 ENCOUNTER — Ambulatory Visit: Payer: Medicare Other | Admitting: Medical

## 2018-11-19 ENCOUNTER — Ambulatory Visit: Payer: Medicare Other | Admitting: Medical

## 2018-12-29 ENCOUNTER — Other Ambulatory Visit: Payer: Self-pay

## 2018-12-29 DIAGNOSIS — I2510 Atherosclerotic heart disease of native coronary artery without angina pectoris: Secondary | ICD-10-CM

## 2018-12-29 DIAGNOSIS — E785 Hyperlipidemia, unspecified: Secondary | ICD-10-CM

## 2018-12-29 DIAGNOSIS — I10 Essential (primary) hypertension: Secondary | ICD-10-CM

## 2018-12-29 MED ORDER — IRBESARTAN-HYDROCHLOROTHIAZIDE 300-12.5 MG PO TABS: 1.0000 | ORAL_TABLET | Freq: Every day | ORAL | 0 refills | Status: DC

## 2018-12-29 MED ORDER — ISOSORBIDE MONONITRATE ER 30 MG PO TB24: ORAL_TABLET | ORAL | 3 refills | Status: DC

## 2018-12-29 MED ORDER — CLOPIDOGREL BISULFATE 75 MG PO TABS: ORAL_TABLET | ORAL | 0 refills | Status: DC

## 2018-12-29 MED ORDER — AMLODIPINE BESYLATE 10 MG PO TABS: 10.0000 mg | ORAL_TABLET | Freq: Every day | ORAL | 0 refills | Status: DC

## 2018-12-29 MED ORDER — CARVEDILOL 25 MG PO TABS: ORAL_TABLET | ORAL | 0 refills | Status: DC

## 2018-12-29 MED ORDER — ATORVASTATIN CALCIUM 20 MG PO TABS: 20.0000 mg | ORAL_TABLET | Freq: Every day | ORAL | 0 refills | Status: DC

## 2019-01-05 ENCOUNTER — Other Ambulatory Visit: Payer: Self-pay | Admitting: Medical

## 2019-01-05 DIAGNOSIS — E785 Hyperlipidemia, unspecified: Secondary | ICD-10-CM

## 2019-01-05 DIAGNOSIS — I10 Essential (primary) hypertension: Secondary | ICD-10-CM

## 2019-01-12 ENCOUNTER — Telehealth: Payer: Self-pay | Admitting: Medical

## 2019-01-13 ENCOUNTER — Telehealth: Payer: Self-pay | Admitting: Medical

## 2019-01-13 NOTE — Telephone Encounter (Signed)
Left detailed message on machine for patient 

## 2019-01-13 NOTE — Telephone Encounter (Signed)
error 

## 2019-01-13 NOTE — Telephone Encounter (Signed)
I received a message from their pharmacy advising that they are not taking the medication regularly for atorvastatin/Lipitor cholesterol.   This is based on refill data.  Not taking her medication regularly could mean that the medicine is not working effectively or providing the proper treatment.   I just wanted to remind him to take the medication regularly for the proper therapeutic effect.

## 2019-01-16 ENCOUNTER — Other Ambulatory Visit: Payer: Self-pay | Admitting: Medical

## 2019-01-16 DIAGNOSIS — E785 Hyperlipidemia, unspecified: Secondary | ICD-10-CM

## 2019-02-25 ENCOUNTER — Other Ambulatory Visit: Payer: Self-pay | Admitting: Medical

## 2019-02-25 DIAGNOSIS — E785 Hyperlipidemia, unspecified: Secondary | ICD-10-CM

## 2019-04-12 ENCOUNTER — Other Ambulatory Visit: Payer: Self-pay | Admitting: Medical

## 2019-04-12 DIAGNOSIS — I10 Essential (primary) hypertension: Secondary | ICD-10-CM

## 2019-04-12 DIAGNOSIS — E785 Hyperlipidemia, unspecified: Secondary | ICD-10-CM

## 2019-04-12 DIAGNOSIS — Z9861 Coronary angioplasty status: Secondary | ICD-10-CM

## 2019-04-19 ENCOUNTER — Other Ambulatory Visit: Payer: Self-pay | Admitting: Medical

## 2019-04-19 DIAGNOSIS — I10 Essential (primary) hypertension: Secondary | ICD-10-CM

## 2019-04-19 DIAGNOSIS — E785 Hyperlipidemia, unspecified: Secondary | ICD-10-CM

## 2019-04-26 ENCOUNTER — Other Ambulatory Visit: Payer: Self-pay | Admitting: Medical

## 2019-04-26 DIAGNOSIS — E785 Hyperlipidemia, unspecified: Secondary | ICD-10-CM

## 2019-04-28 ENCOUNTER — Other Ambulatory Visit: Payer: Self-pay | Admitting: Medical

## 2019-04-28 DIAGNOSIS — E785 Hyperlipidemia, unspecified: Secondary | ICD-10-CM

## 2019-04-28 DIAGNOSIS — I10 Essential (primary) hypertension: Secondary | ICD-10-CM

## 2019-05-25 ENCOUNTER — Other Ambulatory Visit: Payer: Self-pay | Admitting: Medical

## 2019-05-25 DIAGNOSIS — E785 Hyperlipidemia, unspecified: Secondary | ICD-10-CM

## 2019-05-25 DIAGNOSIS — I10 Essential (primary) hypertension: Secondary | ICD-10-CM

## 2019-06-23 ENCOUNTER — Other Ambulatory Visit: Payer: Self-pay | Admitting: Medical

## 2019-06-23 DIAGNOSIS — E785 Hyperlipidemia, unspecified: Secondary | ICD-10-CM

## 2019-06-23 DIAGNOSIS — I10 Essential (primary) hypertension: Secondary | ICD-10-CM

## 2019-06-28 ENCOUNTER — Other Ambulatory Visit: Payer: Self-pay | Admitting: Medical

## 2019-06-28 DIAGNOSIS — I10 Essential (primary) hypertension: Secondary | ICD-10-CM

## 2019-06-28 DIAGNOSIS — E785 Hyperlipidemia, unspecified: Secondary | ICD-10-CM

## 2019-07-11 ENCOUNTER — Encounter (HOSPITAL_COMMUNITY): Payer: Self-pay | Admitting: Cardiology

## 2019-07-11 ENCOUNTER — Telehealth (INDEPENDENT_AMBULATORY_CARE_PROVIDER_SITE_OTHER): Payer: Medicare Other | Admitting: Physician Assistant

## 2019-07-11 ENCOUNTER — Encounter: Payer: Self-pay | Admitting: Physician Assistant

## 2019-07-11 ENCOUNTER — Other Ambulatory Visit: Payer: Self-pay | Admitting: Physician Assistant

## 2019-07-11 VITALS — Ht 67.0 in

## 2019-07-11 DIAGNOSIS — I200 Unstable angina: Secondary | ICD-10-CM | POA: Diagnosis not present

## 2019-07-11 DIAGNOSIS — E785 Hyperlipidemia, unspecified: Secondary | ICD-10-CM | POA: Diagnosis not present

## 2019-07-11 DIAGNOSIS — Z01812 Encounter for preprocedural laboratory examination: Secondary | ICD-10-CM

## 2019-07-11 DIAGNOSIS — I10 Essential (primary) hypertension: Secondary | ICD-10-CM | POA: Diagnosis not present

## 2019-07-11 HISTORY — DX: Unstable angina: I20.0

## 2019-07-11 NOTE — Progress Notes (Signed)
Virtual Visit via Telephone Note   This visit type was conducted due to national recommendations for restrictions regarding the COVID-19 Pandemic (e.g. social distancing) in an effort to limit this patient's exposure and mitigate transmission in our community.  Due to his co-morbid illnesses, this patient is at least at moderate risk for complications without adequate follow up.  This format is felt to be most appropriate for this patient at this time.  The patient did not have access to video technology/had technical difficulties with video requiring transitioning to audio format only (telephone).  All issues noted in this document were discussed and addressed.  No physical exam could be performed with this format.  Please refer to the patient's chart for his  consent to telehealth for [facility].  Evaluation Performed:  Follow-up visit  This visit type was conducted due to national recommendations for restrictions regarding the COVID-19 Pandemic (e.g. social distancing).  This format is felt to be most appropriate for this patient at this time.  All issues noted in this document were discussed and addressed.  No physical exam was performed (except for noted visual exam findings with Video Visits).  Please refer to the patient's chart (MyChart message for video visits and phone note for telephone visits) for the patient's consent to telehealth for [facility].  Date:  07/11/2019   ID:  Jay Fuller, DOB 08-07-1955, MRN [id number]  Patient Location:  Home  Provider location:   Off-site  PCP:  Caryl Ada Pole [place]  Cardiologist:  [name], MD  07/12/2017 Electrophysiologist:  None   Chief Complaint:  Cardiology follow up  History of Present Illness:    Jay Fuller is a 64 y.o. male who presents via audio/video conferencing for a telehealth visit today.    65 y.o. yo male who has a hx of NSTEMI 2015 s/p DES x 2 OM1/CFX, remote  tob/THC/cocaine, HTN, HLD, Hep C s/p Harvoni, cirrhosis  He has been trying to walk, but gets SOB with exertion. He will also get chest pain.   The chest pain is consistent with exertion.  He will get short of breath, and then get the chest pain.  He is to stop, sit down, and rest.  The chest pain is relieved by rest and 5-10 minutes.  He has not tried nitroglycerin for this pain.  It is kind of like the pain he had in 2015, but not completely.  He has not had any resting chest pain.  There is no association with meals or bedtime.  Changing position does not help the pain, just rest.  He has not had any lower extremity edema, no orthopnea or PND.  No palpitations, no presyncope or syncope.  He is having trouble describing the chest pain, says it is sort of a turning in his chest.  He also has trouble rating the pain, but says it is not severe.  At first, he would get the pain every once in a while.  Recently, he has been getting the pain every time he exerts himself.  He does not have a blood pressure cuff at home, but on a home health visit his blood pressure was checked recently.  His blood pressure was 132/87 and he remembers that his heart rate was okay.  Last lipid panel in our system was from 2019, he thinks it might have been done again since then by his PCP, but we do not have those records.  The patient does not have symptoms concerning for COVID-19 infection (fever, chills, cough, or new shortness of breath).    Prior CV studies:   The following studies were reviewed today:  ECHO: 06/24/2013 - Left ventricle: The cavity size was normal. Wall thickness  was increased in a pattern of moderate LVH. Systolic  function was normal. The estimated ejection fraction was  in the range of 55% to 60%. Wall motion was normal; there  were no regional wall motion abnormalities. Doppler  parameters are consistent with abnormal left ventricular  relaxation (grade 1 diastolic  dysfunction).  - Left atrium: The atrium was mildly dilated.   CARDIAC CATH: 06/23/2013 Left Ventriculography:  EF: Roughly 55 %  Wall Motion: Essentially normal  Coronary Anatomy:  Left Main: Large-caliber vessel that bifurcates into the LAD, and Circumflex. Angiographically normal. LAD: Large-caliber vessel that is quite tortuous in its course as it reaches to the apex. It tapers distally. The mid vessel has 2 areas of roughly 20-40% stenoses. There are several small diagonal branches one of which has a 95% stenosis.  Left Circumflex: Large-caliber vessel which gives rise to proximal OM1 before going into the AV groove where it gives off another OM 2, there is a small atrial branch followed by a tubular 20% lesion. Just after a small marginal branch there is a tapering into a 99% lesion at the bifurcation of the distal AV groove Circumflex and LPL 1  OM1: Moderate large-caliber vessel that courses as an obtuse marginal and bifurcates in the mid vessel. There are tandem 80 and 90% lesion (Lesion #2) in the proximal segment prior to bifurcation.  OM 2: Small-caliber vessel with minimal luminal irregularities  LPL 1: Small to moderate caliber vessel that courses along the inferolateral/posterolateral wall to the apex. Diffuse mild luminal irregularities   RCA: Moderate large-caliber dominant vessel it takes a Shepherd's crook bend proximally. There mild diffuse luminal irregularities with 2 major RV marginal branches. The vessel is quite tortuous distally it bifurcates into the Right Posterior Descending Artery (RPDA), and a Right Posterior AV Groove Branch (RPAV)  RPDA: Small-caliber vessel with 2 small branches. Minimal luminal irregularities  RPL Sysytem:The RPAV begin a moderate caliber vessel bifurcates into a small distal posterolateral branch as it terminates as a moderate caliber bifurcating RPL branch  After reviewing the initial angiography, the culprit lesion was thought to  be the distal circumflex proximal lesion just prior to take off of LPL 1, however the OM1 tandem lesions also appear to be relatively significant.  Preparation were made to proceed with PCI on this both Lesions #1 and #2.             PCI Medications:  Aggrastat bolus was administered with plan for the drip to run for 2 hours post PCI  Additional 2500 units IV heparin was administered for an ACT> 300 Sec  Brilinta 180 mg by mouth Percutaneous Coronary Intervention:  Guide: 6 Fr   XB 3.5   Guidewire: Pro-water  Lesion #1: Distal Circumflex 99% reduced to 0%; TIMI 2 flow pre-, TIMI-3 flow post  Predilation Balloon: Mini Trek 2.0 mm x 12 mm;  ? 8 Atm x 30 Sec,  Stent: Promus Premier DES 2.25 mm x 16 mm;  ? 14 Atm x 30 Sec Post-dilation Balloon: Sharon Euphora 2.5 mm x 20 mm;  ? 15 Atm x 45 Sec - distal, 18 Atm x 45 Sec - proximal ? Final Diameter: 2.68m distal, 2.55 mm proximal  Lesion #2: Proximal  OM1; 80% and 90% tandem lesions reduced to 0%; TIMI 3 flow pre-and post  Predilation Balloon: Mini Trek 2.0 mm x 12 mm  ? 8 Atm x 30 Sec x 2 inflations distal and proximal  Stent: Promus Premier DES 2.5 mm x 20 mm;  ? Deployed at: 14 Atm x 30 Sec ? Post-dilation with Stent Balloon: 18 Atm x 60 Sec, ? Final Diameter: 2.75 mm   Post deployment angiography in multiple views, with and without guidewire in place revealed excellent stent deployment and lesion coverage.  There was no evidence of dissection or perforation. The catheter wire was removed completely out of body over wire.  TR Band:  1210 Hours, 13 mL air  MEDICATIONS:  Anesthesia:  Local Lidocaine 2 ml  Sedation:  2 mg IV Versed, 50 mcg IV fentanyl ;   Omnipaque Contrast: 180 ml  Anticoagulation:  IV Heparin 5000+2500 Units ; total 7500 Units   Anti-Platelet Agent:  Aggrastat bolus followed by infusion - to continue 2 hours post PCI; Brilinta 180 mg  PATIENT DISPOSITION:    The patient was transferred to the PACU  holding area in a hemodynamicaly stable, chest pain free condition.  The patient tolerated the procedure well, and there were no complications.  EBL:   < 10 ml  The patient was stable before, during, and after the procedure.  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  Dual Antiplatelet Therapy for a minimum of 1 year, can switch to Plavix after the first month if necessary with P2Y12 inhibition assay checked  Aggressive blood pressure control.  Beta Blocker was converted to carvedilol today, I have added lisinopril 5 mg daily.  Would anticipate at least one more day tomorrow for medication adjustment at discharge on Saturday.   Past Medical History:  Diagnosis Date  . CAD S/P percutaneous coronary angioplasty 06/2013   a. NSTEMI/Cath/PCI: LM nl, dLAD 20-40%, Diags small with ~95%; pLCx 20%, dLCx 99% * (DES PCI - 2.25x16 Promus Premier DES) & pOM1 80-90% (DES PCI: 2.5x20 Promus Premier DES), OM2 min irregs, LPL1 small, min irregs, RCA/RPDA min irregs, RPL nl, EF 55%.  . Chronic pain   . Cirrhosis ([place])    due to Hep C  . Hepatitis B immune 2016  . Hepatitis C    s/p Harvoni therapy 2015  . History of NON-ST ELEVATION MI 06/2013   99% dLCx (DES PCI); 80-90% OM1 (DES PCI) --normal EF.  Normal wall motion.;   . HTN (hypertension)   . Hyperlipidemia   . Polysubstance abuse ([place])    history of cocaine, marijuana, tobacco abuse in the use  . Tobacco abuse    quit 06/2016.  has 12 pack year history (quit 2015 - had relapse, but now quit)  . Unstable angina ([place]) 07/11/2019  . Wears glasses    Past Surgical History:  Procedure Laterality Date  . COLONOSCOPY     denied, but Cologuard negative 12/2013  . CORONARY ANGIOPLASTY WITH STENT PLACEMENT  06/2013     dLCx 99% going into LPL1 (DES PCI: 2.25x16 Promus Premier DES), p OM1 80-90% (DES PCI - 2.5x20 Promus Premier DES)  . LEFT HEART CATHETERIZATION WITH CORONARY ANGIOGRAM N/A 06/23/2013   Procedure: LEFT HEART CATHETERIZATION WITH CORONARY ANGIOGRAM;  Surgeon: Leonie Green  Ellyn Hack, MD;  Location: [facility] CATH LAB:: LM nl, d LAD 20-40%, small caliber Diags w/ severel ~95% lesions, p LCX 20%, dLCx 99% going into LPL1 (PCI- DES), p OM1 80-90% (PCI -DES), OM2 min irregs, LPL1 small, min irregs, RCA/RPDA min irregs, RPL nl, EF 55%.  [name] LIVER BIOPSY  2016  . TRANSTHORACIC ECHOCARDIOGRAM  06/2013   Normal LV size and moderate thickness/moderate LVH.  Normal EF 55-60%. No RWMA. Gr 1 DD. Mlid LA dilation     Current Meds  Medication Sig  . amLODipine (NORVASC) 10 MG tablet TAKE 1 TABLET BY MOUTH EVERY DAY (Patient taking differently: Take 10 mg by mouth daily. )  . aspirin 81 MG chewable tablet One by mouth daily.  [name] atorvastatin (LIPITOR) 20 MG tablet TAKE 1 TABLET DAILY (Patient taking differently: Take 20 mg by mouth daily. )  . carvedilol (COREG) 25 MG tablet TAKE 1 TABLET(25 MG) BY MOUTH TWICE DAILY WITH A MEAL (Patient taking differently: Take 25 mg by mouth 2 (two) times daily with a meal. )  . clopidogrel (PLAVIX) 75 MG tablet TAKE 1 TABLET BY MOUTH EVERY DAY (Patient taking differently: Take 75 mg by mouth daily. )  . diphenhydrAMINE (SOMINEX) 25 MG tablet Take 25 mg by mouth at bedtime.   [name] ibuprofen (ADVIL,MOTRIN) 800 MG tablet Take 1 tablet (800 mg total) by mouth 3 (three) times daily. (Patient taking differently: Take 800 mg by mouth daily as needed for mild pain or moderate pain. )  . irbesartan-hydrochlorothiazide (AVALIDE) 300-12.5 MG tablet TAKE 1 TABLET BY MOUTH DAILY  . isosorbide mononitrate (IMDUR) 30 MG 24 hr tablet TAKE 1 TABLET(30 MG) BY MOUTH DAILY (Patient taking differently: Take 30 mg by mouth daily. )  . nitroGLYCERIN (NITROSTAT) 0.4 MG SL tablet PLACE 1 TABLET UNDER THE TONGUE EVERY 5  MINURES FOR 3 DOSES AS NEEDED FOR CHEST PAIN (Patient taking differently: Place 0.4 mg under the tongue every 5 (five) minutes as needed for chest pain. PLACE 1 TABLET UNDER THE TONGUE EVERY 5 MINURES FOR 3 DOSES AS NEEDED FOR CHEST PAIN)  . umeclidinium-vilanterol (ANORO ELLIPTA) 62.5-25 MCG/INH AEPB Inhale 1 puff into the lungs daily. (Patient not taking: Reported on 07/11/2019)     Allergies:   Patient has no known allergies.   Social History   Tobacco Use  . Smoking status: Former Smoker    Packs/day: 0.50    Years: 25.00    Pack years: 12.50    Quit date: 01/21/2016    Years since quitting: 3.4  . Smokeless tobacco: Never Used  Substance Use Topics  . Alcohol use: Yes    Alcohol/week: 2.0 standard drinks    Types: 2 Cans of beer per week    Comment: twice weekly  . Drug use: Yes    Comment: uses cocaine and marijuana regularly.     Family Hx: The patient's family history includes Cancer in his sister; Heart disease in his father and sister; Hypertension in his father, mother, sister, sister, and sister; Stroke in his sister. There is no history of Diabetes.  He indicated that his mother is deceased. He indicated that his father is deceased. He indicated that two of his four sisters are alive. He indicated that both of his daughters are alive. He indicated that his son is alive. He indicated that the status of his neg hx is unknown.    ROS:   Please see the history of present illness.    All other systems reviewed and are  negative.   Labs/Other Tests and Data Reviewed:    Recent Labs: No results found for requested labs within last 8760 hours.   CBC    Component Value Date/Time   WBC 5.6 02/24/2018 1439   WBC 5.7 01/20/2017 0933   RBC 5.43 02/24/2018 1439   RBC 5.21 01/20/2017 0933   HGB 14.7 02/24/2018 1439   HCT 45.0 02/24/2018 1439   PLT 219 02/24/2018 1439   MCV 83 02/24/2018 1439   MCH 27.1 02/24/2018 1439   MCH 27.8 01/20/2017 0933   MCHC 32.7 02/24/2018  1439   MCHC 33.4 01/20/2017 0933   RDW 14.9 02/24/2018 1439   LYMPHSABS 2.4 02/24/2018 1439   EOSABS 0.1 02/24/2018 1439   BASOSABS 0.0 02/24/2018 1439    CMP Latest Ref Rng & Units 02/24/2018 01/20/2017 07/25/2015  Glucose 65 - 99 mg/dL 133(H) 118(H) 137(H)  BUN 8 - 27 mg/dL _0 Creatinine 0.76 - 1.27 mg/dL 1.02 0.99 1.13  Sodium 134 - 144 mmol/L 142 138 139  Potassium 3.5 - 5.2 mmol/L 4.0 3.9 4.0  Chloride 96 - 106 mmol/L 104 105 102  CO2 20 - 29 mmol/L _1 Calcium 8.6 - 10.2 mg/dL 9.6 9.4 9.6  Total Protein 6.0 - 8.5 g/dL 7.0 7.3 -  Total Bilirubin 0.0 - 1.2 mg/dL 0.3 0.5 -  Alkaline Phos 39 - 117 IU/L 94 - -  AST 0 - 40 IU/L 20 19 -  ALT 0 - 44 IU/L 14 12 -     Recent Lipid Panel Lab Results  Component Value Date/Time   CHOL 156 02/24/2018 02:39 PM   TRIG 129 02/24/2018 02:39 PM   HDL 48 02/24/2018 02:39 PM   CHOLHDL 3.3 02/24/2018 02:39 PM   CHOLHDL 3.1 01/20/2017 09:33 AM   LDLCALC 82 02/24/2018 02:39 PM   LDLCALC 64 01/20/2017 09:33 AM    Wt Readings from Last 3 Encounters:  10/14/18 181 lb (82.1 kg)  02/24/18 179 lb 3.2 oz (81.3 kg)  07/10/17 189 lb (85.7 kg)     Objective:    Vital Signs:  Ht _2  (1.702 m)   BMI 28.35 kg/m  132/87 a few days ago [place] visit.  64 y.o. male in no acute distress over the phone   ASSESSMENT & PLAN:    1.  Unstable angina: -Mr. Reetz started having chest pain with exertion a while back, but it has progressed to the point that it is consistent with exertion. -There is no association with meals, position, deep inspiration. -Although it is not exactly like his pre-PCI pain, it is similar. -I discussed different ways to evaluate this.  He prefers a definitive answer. -His chest pain has a high risk to be cardiac in etiology. - Cardiac catheterization was discussed with the patient fully. The patient understands that risks include but are not limited to stroke (1 in 1000), death (1 in 29), kidney failure  [usually temporary] (1 in 500), bleeding (1 in 200), allergic reaction [possibly serious] (1 in 200).  The patient understands and is willing to proceed.    2.  Hyperlipidemia: -He thinks his PCP may have done labs, but other labs by his PCP are in the system, however we have none since 2019.  He is to get a lipid panel and a CMET with his precath labs  3.  Hypertension: -According to the patient, his blood pressure has been well controlled. -No med changes, follow-up on this with vital signs that  will be obtained to the hospital   COVID-19 Education: The signs and symptoms of COVID-19 were discussed with the patient and how to seek care for testing (follow up with PCP or arrange E-visit).  The importance of social distancing was discussed today.  Patient Risk:   After full review of this patient's clinical status, I feel that they are at least moderate risk at this time.  Time:   Today, I have spent 23 minutes with the patient with telehealth technology discussing cardiac and other health issues.     Medication Adjustments/Labs and Tests Ordered: Current medicines are reviewed at length with the patient today.  Concerns regarding medicines are outlined above.  Tests Ordered: Orders Placed This Encounter  Procedures  . Comprehensive metabolic panel  . Lipid panel  . CBC   Medication Changes: No orders of the defined types were placed in this encounter.   Disposition:  Follow up with [name], MD   Signed, Rosaria Ferries, PA-C  07/11/2019 4:42 PM    [facility]

## 2019-07-11 NOTE — Patient Instructions (Addendum)
    [address] [place] [address] Dept: [phone] Loc: [place]  07/11/2019  You are scheduled for a Cardiac Catheterization on Monday, March 15 with Dr. [name].  1. Please arrive at the [facility] (Main Entrance A) at [facility]: [address] [place], [place] [address] at 5:30 AM (This time is two hours before your procedure to ensure your preparation). Free valet parking service is available.   Special note: Every effort is made to have your procedure done on time. Please understand that emergencies sometimes delay scheduled procedures.  2. Diet: Do not eat solid foods after midnight.  The patient may have clear liquids until 5am upon the day of the procedure.  3. Labs: You will need to have blood drawn this week BEFORE OR BY Friday at [place]  Open: [place] (Lunch 12:30 - 1:30)   Phone: [phone]. You need to be FASTING--NOTHING TO EAT OR DRINK AFTER MIDNIGHT THE NIGHT BEFORE. YOU DO NOT NEED AN APPOINTMENT FOR LABS DONE IN OUR OFFICE. I AM MAILING THE LAB SLIPS TO YOU. PLEASE BRING THEM WITH YOU.  4. Medication instructions in preparation for your procedure:   Contrast Allergy: No  On the morning of your procedure, take your Aspirin and any morning medicines.  You may use sips of water.  COVID testing is scheduled on Friday, 3/12 at 10:45 AM at the [facility].  5. Plan for one night stay--bring personal belongings. 6. Bring a current list of your medications and current insurance cards. 7. You MUST have a responsible person to drive you home. 8. Someone MUST be with you the first 24 hours after you arrive home or your discharge will be delayed. 9. Please wear clothes that are easy to get on and off and wear slip-on shoes.  Thank you for allowing [place] to care for you!   -- [facility]

## 2019-07-11 NOTE — H&P (View-Only) (Signed)
Virtual Visit via Telephone Note   This visit type was conducted due to national recommendations for restrictions regarding the COVID-19 Pandemic (e.g. social distancing) in an effort to limit this patient's exposure and mitigate transmission in our community.  Due to his co-morbid illnesses, this patient is at least at moderate risk for complications without adequate follow up.  This format is felt to be most appropriate for this patient at this time.  The patient did not have access to video technology/had technical difficulties with video requiring transitioning to audio format only (telephone).  All issues noted in this document were discussed and addressed.  No physical exam could be performed with this format.  Please refer to the patient's chart for his  consent to telehealth for [facility].  Evaluation Performed:  Follow-up visit  This visit type was conducted due to national recommendations for restrictions regarding the COVID-19 Pandemic (e.g. social distancing).  This format is felt to be most appropriate for this patient at this time.  All issues noted in this document were discussed and addressed.  No physical exam was performed (except for noted visual exam findings with Video Visits).  Please refer to the patient's chart (MyChart message for video visits and phone note for telephone visits) for the patient's consent to telehealth for [facility].  Date:  07/11/2019   ID:  Jay Fuller, DOB 08-07-1955, MRN [id number]  Patient Location:  Home  Provider location:   Off-site  PCP:  Caryl Ada Pole [place]  Cardiologist:  [name], MD  07/12/2017 Electrophysiologist:  None   Chief Complaint:  Cardiology follow up  History of Present Illness:    Jay Fuller is a 64 y.o. male who presents via audio/video conferencing for a telehealth visit today.    65 y.o. yo male who has a hx of NSTEMI 2015 s/p DES x 2 OM1/CFX, remote  tob/THC/cocaine, HTN, HLD, Hep C s/p Harvoni, cirrhosis  He has been trying to walk, but gets SOB with exertion. He will also get chest pain.   The chest pain is consistent with exertion.  He will get short of breath, and then get the chest pain.  He is to stop, sit down, and rest.  The chest pain is relieved by rest and 5-10 minutes.  He has not tried nitroglycerin for this pain.  It is kind of like the pain he had in 2015, but not completely.  He has not had any resting chest pain.  There is no association with meals or bedtime.  Changing position does not help the pain, just rest.  He has not had any lower extremity edema, no orthopnea or PND.  No palpitations, no presyncope or syncope.  He is having trouble describing the chest pain, says it is sort of a turning in his chest.  He also has trouble rating the pain, but says it is not severe.  At first, he would get the pain every once in a while.  Recently, he has been getting the pain every time he exerts himself.  He does not have a blood pressure cuff at home, but on a home health visit his blood pressure was checked recently.  His blood pressure was 132/87 and he remembers that his heart rate was okay.  Last lipid panel in our system was from 2019, he thinks it might have been done again since then by his PCP, but we do not have those records.  The patient does not have symptoms concerning for COVID-19 infection (fever, chills, cough, or new shortness of breath).    Prior CV studies:   The following studies were reviewed today:  ECHO: 06/24/2013 - Left ventricle: The cavity size was normal. Wall thickness  was increased in a pattern of moderate LVH. Systolic  function was normal. The estimated ejection fraction was  in the range of 55% to 60%. Wall motion was normal; there  were no regional wall motion abnormalities. Doppler  parameters are consistent with abnormal left ventricular  relaxation (grade 1 diastolic  dysfunction).  - Left atrium: The atrium was mildly dilated.   CARDIAC CATH: 06/23/2013 Left Ventriculography:  EF: Roughly 55 %  Wall Motion: Essentially normal  Coronary Anatomy:  Left Main: Large-caliber vessel that bifurcates into the LAD, and Circumflex. Angiographically normal. LAD: Large-caliber vessel that is quite tortuous in its course as it reaches to the apex. It tapers distally. The mid vessel has 2 areas of roughly 20-40% stenoses. There are several small diagonal branches one of which has a 95% stenosis.  Left Circumflex: Large-caliber vessel which gives rise to proximal OM1 before going into the AV groove where it gives off another OM 2, there is a small atrial branch followed by a tubular 20% lesion. Just after a small marginal branch there is a tapering into a 99% lesion at the bifurcation of the distal AV groove Circumflex and LPL 1  OM1: Moderate large-caliber vessel that courses as an obtuse marginal and bifurcates in the mid vessel. There are tandem 80 and 90% lesion (Lesion #2) in the proximal segment prior to bifurcation.  OM 2: Small-caliber vessel with minimal luminal irregularities  LPL 1: Small to moderate caliber vessel that courses along the inferolateral/posterolateral wall to the apex. Diffuse mild luminal irregularities   RCA: Moderate large-caliber dominant vessel it takes a Shepherd's crook bend proximally. There mild diffuse luminal irregularities with 2 major RV marginal branches. The vessel is quite tortuous distally it bifurcates into the Right Posterior Descending Artery (RPDA), and a Right Posterior AV Groove Branch (RPAV)  RPDA: Small-caliber vessel with 2 small branches. Minimal luminal irregularities  RPL Sysytem:The RPAV begin a moderate caliber vessel bifurcates into a small distal posterolateral branch as it terminates as a moderate caliber bifurcating RPL branch  After reviewing the initial angiography, the culprit lesion was thought to  be the distal circumflex proximal lesion just prior to take off of LPL 1, however the OM1 tandem lesions also appear to be relatively significant.  Preparation were made to proceed with PCI on this both Lesions #1 and #2.             PCI Medications:  Aggrastat bolus was administered with plan for the drip to run for 2 hours post PCI  Additional 2500 units IV heparin was administered for an ACT> 300 Sec  Brilinta 180 mg by mouth Percutaneous Coronary Intervention:  Guide: 6 Fr   XB 3.5   Guidewire: Pro-water  Lesion #1: Distal Circumflex 99% reduced to 0%; TIMI 2 flow pre-, TIMI-3 flow post  Predilation Balloon: Mini Trek 2.0 mm x 12 mm;  ? 8 Atm x 30 Sec,  Stent: Promus Premier DES 2.25 mm x 16 mm;  ? 14 Atm x 30 Sec Post-dilation Balloon: Gallant Euphora 2.5 mm x 20 mm;  ? 15 Atm x 45 Sec - distal, 18 Atm x 45 Sec - proximal ? Final Diameter: 2.68m distal, 2.55 mm proximal  Lesion #2: Proximal  OM1; 80% and 90% tandem lesions reduced to 0%; TIMI 3 flow pre-and post  Predilation Balloon: Mini Trek 2.0 mm x 12 mm  ? 8 Atm x 30 Sec x 2 inflations distal and proximal  Stent: Promus Premier DES 2.5 mm x 20 mm;  ? Deployed at: 14 Atm x 30 Sec ? Post-dilation with Stent Balloon: 18 Atm x 60 Sec, ? Final Diameter: 2.75 mm   Post deployment angiography in multiple views, with and without guidewire in place revealed excellent stent deployment and lesion coverage.  There was no evidence of dissection or perforation. The catheter wire was removed completely out of body over wire.  TR Band:  1210 Hours, 13 mL air  MEDICATIONS:  Anesthesia:  Local Lidocaine 2 ml  Sedation:  2 mg IV Versed, 50 mcg IV fentanyl ;   Omnipaque Contrast: 180 ml  Anticoagulation:  IV Heparin 5000+2500 Units ; total 7500 Units   Anti-Platelet Agent:  Aggrastat bolus followed by infusion - to continue 2 hours post PCI; Brilinta 180 mg  PATIENT DISPOSITION:    The patient was transferred to the PACU  holding area in a hemodynamicaly stable, chest pain free condition.  The patient tolerated the procedure well, and there were no complications.  EBL:   < 10 ml  The patient was stable before, during, and after the procedure.  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  Dual Antiplatelet Therapy for a minimum of 1 year, can switch to Plavix after the first month if necessary with P2Y12 inhibition assay checked  Aggressive blood pressure control.  Beta Blocker was converted to carvedilol today, I have added lisinopril 5 mg daily.  Would anticipate at least one more day tomorrow for medication adjustment at discharge on Saturday.   Past Medical History:  Diagnosis Date  . CAD S/P percutaneous coronary angioplasty 06/2013   a. NSTEMI/Cath/PCI: LM nl, dLAD 20-40%, Diags small with ~95%; pLCx 20%, dLCx 99% * (DES PCI - 2.25x16 Promus Premier DES) & pOM1 80-90% (DES PCI: 2.5x20 Promus Premier DES), OM2 min irregs, LPL1 small, min irregs, RCA/RPDA min irregs, RPL nl, EF 55%.  . Chronic pain   . Cirrhosis ([place])    due to Hep C  . Hepatitis B immune 2016  . Hepatitis C    s/p Harvoni therapy 2015  . History of NON-ST ELEVATION MI 06/2013   99% dLCx (DES PCI); 80-90% OM1 (DES PCI) --normal EF.  Normal wall motion.;   . HTN (hypertension)   . Hyperlipidemia   . Polysubstance abuse ([place])    history of cocaine, marijuana, tobacco abuse in the use  . Tobacco abuse    quit 06/2016.  has 12 pack year history (quit 2015 - had relapse, but now quit)  . Unstable angina ([place]) 07/11/2019  . Wears glasses    Past Surgical History:  Procedure Laterality Date  . COLONOSCOPY     denied, but Cologuard negative 12/2013  . CORONARY ANGIOPLASTY WITH STENT PLACEMENT  06/2013     dLCx 99% going into LPL1 (DES PCI: 2.25x16 Promus Premier DES), p OM1 80-90% (DES PCI - 2.5x20 Promus Premier DES)  . LEFT HEART CATHETERIZATION WITH CORONARY ANGIOGRAM N/A 06/23/2013   Procedure: LEFT HEART CATHETERIZATION WITH CORONARY ANGIOGRAM;  Surgeon: Leonie Green  Ellyn Hack, MD;  Location: [facility] CATH LAB:: LM nl, d LAD 20-40%, small caliber Diags w/ severel ~95% lesions, p LCX 20%, dLCx 99% going into LPL1 (PCI- DES), p OM1 80-90% (PCI -DES), OM2 min irregs, LPL1 small, min irregs, RCA/RPDA min irregs, RPL nl, EF 55%.  [name] LIVER BIOPSY  2016  . TRANSTHORACIC ECHOCARDIOGRAM  06/2013   Normal LV size and moderate thickness/moderate LVH.  Normal EF 55-60%. No RWMA. Gr 1 DD. Mlid LA dilation     Current Meds  Medication Sig  . amLODipine (NORVASC) 10 MG tablet TAKE 1 TABLET BY MOUTH EVERY DAY (Patient taking differently: Take 10 mg by mouth daily. )  . aspirin 81 MG chewable tablet One by mouth daily.  [name] atorvastatin (LIPITOR) 20 MG tablet TAKE 1 TABLET DAILY (Patient taking differently: Take 20 mg by mouth daily. )  . carvedilol (COREG) 25 MG tablet TAKE 1 TABLET(25 MG) BY MOUTH TWICE DAILY WITH A MEAL (Patient taking differently: Take 25 mg by mouth 2 (two) times daily with a meal. )  . clopidogrel (PLAVIX) 75 MG tablet TAKE 1 TABLET BY MOUTH EVERY DAY (Patient taking differently: Take 75 mg by mouth daily. )  . diphenhydrAMINE (SOMINEX) 25 MG tablet Take 25 mg by mouth at bedtime.   [name] ibuprofen (ADVIL,MOTRIN) 800 MG tablet Take 1 tablet (800 mg total) by mouth 3 (three) times daily. (Patient taking differently: Take 800 mg by mouth daily as needed for mild pain or moderate pain. )  . irbesartan-hydrochlorothiazide (AVALIDE) 300-12.5 MG tablet TAKE 1 TABLET BY MOUTH DAILY  . isosorbide mononitrate (IMDUR) 30 MG 24 hr tablet TAKE 1 TABLET(30 MG) BY MOUTH DAILY (Patient taking differently: Take 30 mg by mouth daily. )  . nitroGLYCERIN (NITROSTAT) 0.4 MG SL tablet PLACE 1 TABLET UNDER THE TONGUE EVERY 5  MINURES FOR 3 DOSES AS NEEDED FOR CHEST PAIN (Patient taking differently: Place 0.4 mg under the tongue every 5 (five) minutes as needed for chest pain. PLACE 1 TABLET UNDER THE TONGUE EVERY 5 MINURES FOR 3 DOSES AS NEEDED FOR CHEST PAIN)  . umeclidinium-vilanterol (ANORO ELLIPTA) 62.5-25 MCG/INH AEPB Inhale 1 puff into the lungs daily. (Patient not taking: Reported on 07/11/2019)     Allergies:   Patient has no known allergies.   Social History   Tobacco Use  . Smoking status: Former Smoker    Packs/day: 0.50    Years: 25.00    Pack years: 12.50    Quit date: 01/21/2016    Years since quitting: 3.4  . Smokeless tobacco: Never Used  Substance Use Topics  . Alcohol use: Yes    Alcohol/week: 2.0 standard drinks    Types: 2 Cans of beer per week    Comment: twice weekly  . Drug use: Yes    Comment: uses cocaine and marijuana regularly.     Family Hx: The patient's family history includes Cancer in his sister; Heart disease in his father and sister; Hypertension in his father, mother, sister, sister, and sister; Stroke in his sister. There is no history of Diabetes.  He indicated that his mother is deceased. He indicated that his father is deceased. He indicated that two of his four sisters are alive. He indicated that both of his daughters are alive. He indicated that his son is alive. He indicated that the status of his neg hx is unknown.    ROS:   Please see the history of present illness.    All other systems reviewed and are  negative.   Labs/Other Tests and Data Reviewed:    Recent Labs: No results found for requested labs within last 8760 hours.   CBC    Component Value Date/Time   WBC 5.6 02/24/2018 1439   WBC 5.7 01/20/2017 0933   RBC 5.43 02/24/2018 1439   RBC 5.21 01/20/2017 0933   HGB 14.7 02/24/2018 1439   HCT 45.0 02/24/2018 1439   PLT 219 02/24/2018 1439   MCV 83 02/24/2018 1439   MCH 27.1 02/24/2018 1439   MCH 27.8 01/20/2017 0933   MCHC 32.7 02/24/2018  1439   MCHC 33.4 01/20/2017 0933   RDW 14.9 02/24/2018 1439   LYMPHSABS 2.4 02/24/2018 1439   EOSABS 0.1 02/24/2018 1439   BASOSABS 0.0 02/24/2018 1439    CMP Latest Ref Rng & Units 02/24/2018 01/20/2017 07/25/2015  Glucose 65 - 99 mg/dL 133(H) 118(H) 137(H)  BUN 8 - 27 mg/dL _0 Creatinine 0.76 - 1.27 mg/dL 1.02 0.99 1.13  Sodium 134 - 144 mmol/L 142 138 139  Potassium 3.5 - 5.2 mmol/L 4.0 3.9 4.0  Chloride 96 - 106 mmol/L 104 105 102  CO2 20 - 29 mmol/L _1 Calcium 8.6 - 10.2 mg/dL 9.6 9.4 9.6  Total Protein 6.0 - 8.5 g/dL 7.0 7.3 -  Total Bilirubin 0.0 - 1.2 mg/dL 0.3 0.5 -  Alkaline Phos 39 - 117 IU/L 94 - -  AST 0 - 40 IU/L 20 19 -  ALT 0 - 44 IU/L 14 12 -     Recent Lipid Panel Lab Results  Component Value Date/Time   CHOL 156 02/24/2018 02:39 PM   TRIG 129 02/24/2018 02:39 PM   HDL 48 02/24/2018 02:39 PM   CHOLHDL 3.3 02/24/2018 02:39 PM   CHOLHDL 3.1 01/20/2017 09:33 AM   LDLCALC 82 02/24/2018 02:39 PM   LDLCALC 64 01/20/2017 09:33 AM    Wt Readings from Last 3 Encounters:  10/14/18 181 lb (82.1 kg)  02/24/18 179 lb 3.2 oz (81.3 kg)  07/10/17 189 lb (85.7 kg)     Objective:    Vital Signs:  Ht _2  (1.702 m)   BMI 28.35 kg/m  132/87 a few days ago [place] visit.  64 y.o. male in no acute distress over the phone   ASSESSMENT & PLAN:    1.  Unstable angina: -Mr. Reetz started having chest pain with exertion a while back, but it has progressed to the point that it is consistent with exertion. -There is no association with meals, position, deep inspiration. -Although it is not exactly like his pre-PCI pain, it is similar. -I discussed different ways to evaluate this.  He prefers a definitive answer. -His chest pain has a high risk to be cardiac in etiology. - Cardiac catheterization was discussed with the patient fully. The patient understands that risks include but are not limited to stroke (1 in 1000), death (1 in 29), kidney failure  [usually temporary] (1 in 500), bleeding (1 in 200), allergic reaction [possibly serious] (1 in 200).  The patient understands and is willing to proceed.    2.  Hyperlipidemia: -He thinks his PCP may have done labs, but other labs by his PCP are in the system, however we have none since 2019.  He is to get a lipid panel and a CMET with his precath labs  3.  Hypertension: -According to the patient, his blood pressure has been well controlled. -No med changes, follow-up on this with vital signs that  will be obtained to the hospital   COVID-19 Education: The signs and symptoms of COVID-19 were discussed with the patient and how to seek care for testing (follow up with PCP or arrange E-visit).  The importance of social distancing was discussed today.  Patient Risk:   After full review of this patient's clinical status, I feel that they are at least moderate risk at this time.  Time:   Today, I have spent 23 minutes with the patient with telehealth technology discussing cardiac and other health issues.     Medication Adjustments/Labs and Tests Ordered: Current medicines are reviewed at length with the patient today.  Concerns regarding medicines are outlined above.  Tests Ordered: Orders Placed This Encounter  Procedures  . Comprehensive metabolic panel  . Lipid panel  . CBC   Medication Changes: No orders of the defined types were placed in this encounter.   Disposition:  Follow up with [name], MD   Signed, Rosaria Ferries, PA-C  07/11/2019 4:42 PM    [facility]

## 2019-07-12 ENCOUNTER — Telehealth: Payer: Self-pay

## 2019-07-12 NOTE — Telephone Encounter (Signed)
I received a fax from [facility] for a refill on the pts. Irbesart-hctz 300-12.5mg  pt. Last apt was 10/14/18

## 2019-07-13 ENCOUNTER — Other Ambulatory Visit: Payer: Self-pay

## 2019-07-13 DIAGNOSIS — I10 Essential (primary) hypertension: Secondary | ICD-10-CM

## 2019-07-13 DIAGNOSIS — E785 Hyperlipidemia, unspecified: Secondary | ICD-10-CM

## 2019-07-13 MED ORDER — IRBESARTAN-HYDROCHLOROTHIAZIDE 300-12.5 MG PO TABS: 1.0000 | ORAL_TABLET | Freq: Every day | ORAL | 0 refills | Status: DC

## 2019-07-13 NOTE — Telephone Encounter (Signed)
Medication has been sent to the pharmacy. 

## 2019-07-14 ENCOUNTER — Telehealth: Payer: Self-pay | Admitting: *Deleted

## 2019-07-14 NOTE — Telephone Encounter (Signed)
Pt contacted pre-catheterization scheduled at [facility] for: Monday July 18, 2019 7:30 AM Verified arrival time and place: [facility]) at: 5:30 AM   No solid food after midnight prior to cath, clear liquids until 5 AM day of procedure. Contrast allergy: no  Hold: Irbesartan-HCTZ-AM of procedure  Except hold medications AM meds can be  taken pre-cath with sip of water including: ASA 81 mg Plavix 75 mg   Confirmed patient has responsible adult to drive home post procedure and observe 24 hours after arriving home: yes  Currently, due to Covid-19 pandemic, only one person will be allowed with patient. Must be the same person for patient's entire stay and will be required to wear a mask. They will be asked to wait in the waiting room for the duration of the patient's stay.  Patients are required to wear a mask when they enter the hospital.      COVID-19 Pre-Screening Questions:  . In the past 7 to 10 days have you had a cough,  shortness of breath, headache, congestion, fever (100 or greater) body aches, chills, sore throat, or sudden loss of taste or sense of smell? no . Have you been around anyone with known Covid 19 in the past 7-10 days? no . Have you been around anyone who is awaiting Covid 19 test results in the past 7 to 10 days? . Have you been around anyone who has been exposed to Covid 19, or has mentioned symptoms of Covid 19 within the past 7 to 10 days?  I reviewed procedure/mask/visitor instructions, COVID-19 screening questions with patient, he verbalized understanding, thanked me for call.  I reminded patient that he needed to get pre procedure BMP/CBC and COVID-19 test done tomorrow morning. I provided addresses for both locations and phone number to call our office if questions.

## 2019-07-15 ENCOUNTER — Other Ambulatory Visit (HOSPITAL_COMMUNITY)
Admission: RE | Admit: 2019-07-15 | Discharge: 2019-07-15 | Disposition: A | Discharge status: Home / Self Care | Payer: Medicare Other | Source: Ambulatory Visit | Attending: Cardiology | Admitting: Cardiology

## 2019-07-15 DIAGNOSIS — Z20822 Contact with and (suspected) exposure to covid-19: Secondary | ICD-10-CM | POA: Diagnosis not present

## 2019-07-15 DIAGNOSIS — Z01812 Encounter for preprocedural laboratory examination: Secondary | ICD-10-CM | POA: Insufficient documentation

## 2019-07-15 LAB — COMPREHENSIVE METABOLIC PANEL
ALT: 20 IU/L (ref 0–44)
AST: 28 IU/L (ref 0–40)
Albumin/Globulin Ratio: 2 (ref 1.2–2.2)
Albumin: 4.5 g/dL (ref 3.8–4.8)
Alkaline Phosphatase: 92 IU/L (ref 39–117)
BUN/Creatinine Ratio: 10 (ref 10–24)
BUN: 10 mg/dL (ref 8–27)
Bilirubin Total: 0.3 mg/dL (ref 0.0–1.2)
CO2: 22 mmol/L (ref 20–29)
Calcium: 9.3 mg/dL (ref 8.6–10.2)
Chloride: 98 mmol/L (ref 96–106)
Creatinine, Ser: 1.02 mg/dL (ref 0.76–1.27)
GFR calc Af Amer: 89 mL/min/{1.73_m2} (ref >59)
GFR calc non Af Amer: 77 mL/min/{1.73_m2} (ref >59)
Globulin, Total: 2.3 g/dL (ref 1.5–4.5)
Glucose: 126 mg/dL — ABNORMAL HIGH (ref 65–99)
Potassium: 3.9 mmol/L (ref 3.5–5.2)
Sodium: 136 mmol/L (ref 134–144)
Total Protein: 6.8 g/dL (ref 6.0–8.5)

## 2019-07-15 LAB — CBC
Hematocrit: 40 % (ref 37.5–51.0)
Hemoglobin: 13.5 g/dL (ref 13.0–17.7)
MCH: 27.9 pg (ref 26.6–33.0)
MCHC: 33.8 g/dL (ref 31.5–35.7)
MCV: 83 fL (ref 79–97)
Platelets: 193 10*3/uL (ref 150–450)
RBC: 4.84 x10E6/uL (ref 4.14–5.80)
RDW: 13.8 % (ref 11.6–15.4)
WBC: 5.1 10*3/uL (ref 3.4–10.8)

## 2019-07-15 LAB — LIPID PANEL
Chol/HDL Ratio: 3.4 ratio (ref 0.0–5.0)
Cholesterol, Total: 155 mg/dL (ref 100–199)
HDL: 46 mg/dL (ref >39)
LDL Chol Calc (NIH): 68 mg/dL (ref 0–99)
Triglycerides: 257 mg/dL — ABNORMAL HIGH (ref 0–149)
VLDL Cholesterol Cal: 41 mg/dL — ABNORMAL HIGH (ref 5–40)

## 2019-07-15 LAB — SARS CORONAVIRUS 2 (TAT 6-24 HRS): SARS Coronavirus 2: NEGATIVE

## 2019-07-18 ENCOUNTER — Other Ambulatory Visit: Payer: Self-pay

## 2019-07-18 ENCOUNTER — Encounter (HOSPITAL_COMMUNITY)
Admission: RE | Disposition: A | Discharge status: Home / Self Care | Payer: Self-pay | Source: Home / Self Care | Attending: Cardiology

## 2019-07-18 ENCOUNTER — Ambulatory Visit (HOSPITAL_COMMUNITY)
Admission: RE | Admit: 2019-07-18 | Discharge: 2019-07-18 | Disposition: A | Discharge status: Home / Self Care | Payer: Medicare Other | Attending: Cardiology | Admitting: Cardiology

## 2019-07-18 DIAGNOSIS — F1910 Other psychoactive substance abuse, uncomplicated: Secondary | ICD-10-CM | POA: Diagnosis not present

## 2019-07-18 DIAGNOSIS — I252 Old myocardial infarction: Secondary | ICD-10-CM | POA: Insufficient documentation

## 2019-07-18 DIAGNOSIS — I200 Unstable angina: Secondary | ICD-10-CM | POA: Diagnosis present

## 2019-07-18 DIAGNOSIS — Z7902 Long term (current) use of antithrombotics/antiplatelets: Secondary | ICD-10-CM | POA: Diagnosis not present

## 2019-07-18 DIAGNOSIS — Z7982 Long term (current) use of aspirin: Secondary | ICD-10-CM | POA: Diagnosis not present

## 2019-07-18 DIAGNOSIS — Z955 Presence of coronary angioplasty implant and graft: Secondary | ICD-10-CM | POA: Diagnosis not present

## 2019-07-18 DIAGNOSIS — E785 Hyperlipidemia, unspecified: Secondary | ICD-10-CM | POA: Diagnosis not present

## 2019-07-18 DIAGNOSIS — R0602 Shortness of breath: Secondary | ICD-10-CM | POA: Diagnosis not present

## 2019-07-18 DIAGNOSIS — Z79899 Other long term (current) drug therapy: Secondary | ICD-10-CM | POA: Diagnosis not present

## 2019-07-18 DIAGNOSIS — I25110 Atherosclerotic heart disease of native coronary artery with unstable angina pectoris: Secondary | ICD-10-CM | POA: Diagnosis present

## 2019-07-18 DIAGNOSIS — I10 Essential (primary) hypertension: Secondary | ICD-10-CM | POA: Insufficient documentation

## 2019-07-18 DIAGNOSIS — Z87891 Personal history of nicotine dependence: Secondary | ICD-10-CM | POA: Diagnosis not present

## 2019-07-18 DIAGNOSIS — K7460 Unspecified cirrhosis of liver: Secondary | ICD-10-CM | POA: Diagnosis not present

## 2019-07-18 HISTORY — PX: LEFT HEART CATH AND CORONARY ANGIOGRAPHY: CATH118249

## 2019-07-18 SURGERY — LEFT HEART CATH AND CORONARY ANGIOGRAPHY
Anesthesia: LOCAL

## 2019-07-18 MED ORDER — SODIUM CHLORIDE 0.9 % IV SOLN: 250.0000 mL | INTRAVENOUS | Status: DC | PRN

## 2019-07-18 MED ORDER — MIDAZOLAM HCL 2 MG/2ML IJ SOLN
INTRAMUSCULAR | Status: DC | PRN
  Administered 2019-07-18: 2 mg via INTRAVENOUS

## 2019-07-18 MED ORDER — VERAPAMIL HCL 2.5 MG/ML IV SOLN
INTRAVENOUS | Status: AC
  Filled 2019-07-18: qty 2

## 2019-07-18 MED ORDER — FENTANYL CITRATE (PF) 100 MCG/2ML IJ SOLN
INTRAMUSCULAR | Status: DC | PRN
  Administered 2019-07-18: 50 ug via INTRAVENOUS

## 2019-07-18 MED ORDER — ACETAMINOPHEN 325 MG PO TABS: 650.0000 mg | ORAL_TABLET | ORAL | Status: DC | PRN

## 2019-07-18 MED ORDER — VERAPAMIL HCL 2.5 MG/ML IV SOLN
INTRAVENOUS | Status: DC | PRN
  Administered 2019-07-18: 10 mL via INTRA_ARTERIAL

## 2019-07-18 MED ORDER — LIDOCAINE HCL (PF) 1 % IJ SOLN
INTRAMUSCULAR | Status: AC
  Filled 2019-07-18: qty 30

## 2019-07-18 MED ORDER — HEPARIN SODIUM (PORCINE) 1000 UNIT/ML IJ SOLN
INTRAMUSCULAR | Status: DC | PRN
  Administered 2019-07-18: 4500 [IU] via INTRAVENOUS

## 2019-07-18 MED ORDER — SODIUM CHLORIDE 0.9% FLUSH: 3.0000 mL | INTRAVENOUS | Status: DC | PRN

## 2019-07-18 MED ORDER — SODIUM CHLORIDE 0.9 % WEIGHT BASED INFUSION
3.0000 mL/kg/h | INTRAVENOUS | Status: AC
  Administered 2019-07-18: 06:00:00 3 mL/kg/h via INTRAVENOUS

## 2019-07-18 MED ORDER — MIDAZOLAM HCL 2 MG/2ML IJ SOLN
INTRAMUSCULAR | Status: AC
  Filled 2019-07-18: qty 2

## 2019-07-18 MED ORDER — HEPARIN (PORCINE) IN NACL 1000-0.9 UT/500ML-% IV SOLN
INTRAVENOUS | Status: AC
  Filled 2019-07-18: qty 1000

## 2019-07-18 MED ORDER — CLOPIDOGREL BISULFATE 75 MG PO TABS
75.0000 mg | ORAL_TABLET | Freq: Once | ORAL | Status: AC
  Administered 2019-07-18: 06:00:00 75 mg via ORAL
  Filled 2019-07-18: qty 1

## 2019-07-18 MED ORDER — FENTANYL CITRATE (PF) 100 MCG/2ML IJ SOLN
INTRAMUSCULAR | Status: AC
  Filled 2019-07-18: qty 2

## 2019-07-18 MED ORDER — HEPARIN (PORCINE) IN NACL 1000-0.9 UT/500ML-% IV SOLN
INTRAVENOUS | Status: DC | PRN
  Administered 2019-07-18 (×2): 500 mL

## 2019-07-18 MED ORDER — ONDANSETRON HCL 4 MG/2ML IJ SOLN: 4.0000 mg | Freq: Four times a day (QID) | INTRAMUSCULAR | Status: DC | PRN

## 2019-07-18 MED ORDER — IOHEXOL 350 MG/ML SOLN
INTRAVENOUS | Status: DC | PRN
  Administered 2019-07-18: 35 mL

## 2019-07-18 MED ORDER — SODIUM CHLORIDE 0.9 % IV SOLN: INTRAVENOUS | Status: AC

## 2019-07-18 MED ORDER — HYDRALAZINE HCL 20 MG/ML IJ SOLN: 10.0000 mg | INTRAMUSCULAR | Status: DC | PRN

## 2019-07-18 MED ORDER — LABETALOL HCL 5 MG/ML IV SOLN: 10.0000 mg | INTRAVENOUS | Status: DC | PRN

## 2019-07-18 MED ORDER — SODIUM CHLORIDE 0.9% FLUSH: 3.0000 mL | Freq: Two times a day (BID) | INTRAVENOUS | Status: DC

## 2019-07-18 MED ORDER — LIDOCAINE HCL (PF) 1 % IJ SOLN
INTRAMUSCULAR | Status: DC | PRN
  Administered 2019-07-18: 2 mL

## 2019-07-18 MED ORDER — ASPIRIN 81 MG PO CHEW
81.0000 mg | CHEWABLE_TABLET | ORAL | Status: AC
  Administered 2019-07-18: 81 mg via ORAL
  Filled 2019-07-18: qty 1

## 2019-07-18 MED ORDER — HEPARIN SODIUM (PORCINE) 1000 UNIT/ML IJ SOLN
INTRAMUSCULAR | Status: AC
  Filled 2019-07-18: qty 1

## 2019-07-18 MED ORDER — SODIUM CHLORIDE 0.9 % WEIGHT BASED INFUSION: 1.0000 mL/kg/h | INTRAVENOUS | Status: DC

## 2019-07-18 SURGICAL SUPPLY — 9 items
CATH OPTITORQUE TIG 4.0 5F (CATHETERS) ×2 IMPLANT
DEVICE RAD COMP TR BAND LRG (VASCULAR PRODUCTS) ×2 IMPLANT
GLIDESHEATH SLEND SS 6F .021 (SHEATH) ×2 IMPLANT
GUIDEWIRE INQWIRE 1.5J.035X260 (WIRE) ×1 IMPLANT
INQWIRE 1.5J .035X260CM (WIRE) ×2
KIT HEART LEFT (KITS) ×2 IMPLANT
PACK CARDIAC CATHETERIZATION (CUSTOM PROCEDURE TRAY) ×2 IMPLANT
TRANSDUCER W/STOPCOCK (MISCELLANEOUS) ×2 IMPLANT
TUBING CIL FLEX 10 FLL-RA (TUBING) ×2 IMPLANT

## 2019-07-18 NOTE — Interval H&P Note (Signed)
History and Physical Interval Note:  07/18/2019 7:32 AM  Jay Fuller  has presented today for surgery, with the diagnosis of unstable angina.  The various methods of treatment have been discussed with the patient and family. After consideration of risks, benefits and other options for treatment, the patient has consented to  Procedure(s): LEFT HEART CATH AND CORONARY ANGIOGRAPHY (N/A) as a surgical intervention.  The patient's history has been reviewed, patient examined, no change in status, stable for surgery.  I have reviewed the patient's chart and labs.  Questions were answered to the patient's satisfaction.    Cath Lab Visit (complete for each Cath Lab visit)  Clinical Evaluation Leading to the Procedure:   ACS: No.  Non-ACS:    Anginal Classification: CCS III  Anti-ischemic medical therapy: Maximal Therapy (2 or more classes of medications)  Non-Invasive Test Results: No non-invasive testing performed  Prior CABG: No previous CABG   Jay Fuller

## 2019-07-18 NOTE — Discharge Instructions (Signed)
Radial Site Care  This sheet gives you information about how to care for yourself after your procedure. Your health care provider may also give you more specific instructions. If you have problems or questions, contact your health care provider. What can I expect after the procedure? After the procedure, it is common to have:  Bruising and tenderness at the catheter insertion area. Follow these instructions at home: Medicines  Take over-the-counter and prescription medicines only as told by your health care provider. Insertion site care  Follow instructions from your health care provider about how to take care of your insertion site. Make sure you: ? Wash your hands with soap and water before you change your bandage (dressing). If soap and water are not available, use hand sanitizer. ? Change your dressing as told by your health care provider. ? Leave stitches (sutures), skin glue, or adhesive strips in place. These skin closures may need to stay in place for 2 weeks or longer. If adhesive strip edges start to loosen and curl up, you may trim the loose edges. Do not remove adhesive strips completely unless your health care provider tells you to do that.  Check your insertion site every day for signs of infection. Check for: ? Redness, swelling, or pain. ? Fluid or blood. ? Pus or a bad smell. ? Warmth.  Do not take baths, swim, or use a hot tub until your health care provider approves.  You may shower 24-48 hours after the procedure, or as directed by your health care provider. ? Remove the dressing and gently wash the site with plain soap and water. ? Pat the area dry with a clean towel. ? Do not rub the site. That could cause bleeding.  Do not apply powder or lotion to the site. Activity   For 24 hours after the procedure, or as directed by your health care provider: ? Do not flex or bend the affected arm. ? Do not push or pull heavy objects with the affected arm. ? Do not  drive yourself home from the hospital or clinic. You may drive 24 hours after the procedure unless your health care provider tells you not to. ? Do not operate machinery or power tools.  Do not lift anything that is heavier than 10 lb (4.5 kg), or the limit that you are told, until your health care provider says that it is safe.  Ask your health care provider when it is okay to: ? Return to work or school. ? Resume usual physical activities or sports. ? Resume sexual activity. General instructions  If the catheter site starts to bleed, raise your arm and put firm pressure on the site. If the bleeding does not stop, get help right away. This is a medical emergency.  If you went home on the same day as your procedure, a responsible adult should be with you for the first 24 hours after you arrive home.  Keep all follow-up visits as told by your health care provider. This is important. Contact a health care provider if:  You have a fever.  You have redness, swelling, or yellow drainage around your insertion site. Get help right away if:  You have unusual pain at the radial site.  The catheter insertion area swells very fast.  The insertion area is bleeding, and the bleeding does not stop when you hold steady pressure on the area.  Your arm or hand becomes pale, cool, tingly, or numb. These symptoms may represent a serious problem   that is an emergency. Do not wait to see if the symptoms will go away. Get medical help right away. Call your local emergency services (911 in the [place] Do not drive yourself to the hospital. Summary  After the procedure, it is common to have bruising and tenderness at the site.  Follow instructions from your health care provider about how to take care of your radial site wound. Check the wound every day for signs of infection.  Do not lift anything that is heavier than 10 lb (4.5 kg), or the limit that you are told, until your health care provider says  that it is safe. This information is not intended to replace advice given to you by your health care provider. Make sure you discuss any questions you have with your health care provider. Document Revised: 05/27/2017 Document Reviewed: 05/27/2017 Elsevier Patient Education  2020 [employer]vier Inc.  

## 2019-07-21 ENCOUNTER — Other Ambulatory Visit: Payer: Self-pay | Admitting: Medical

## 2019-07-21 DIAGNOSIS — E785 Hyperlipidemia, unspecified: Secondary | ICD-10-CM

## 2019-07-21 DIAGNOSIS — I10 Essential (primary) hypertension: Secondary | ICD-10-CM

## 2019-08-10 ENCOUNTER — Other Ambulatory Visit: Payer: Self-pay

## 2019-08-10 ENCOUNTER — Other Ambulatory Visit: Payer: Self-pay | Admitting: Medical

## 2019-08-10 ENCOUNTER — Ambulatory Visit (INDEPENDENT_AMBULATORY_CARE_PROVIDER_SITE_OTHER): Payer: Medicare Other | Admitting: Cardiology

## 2019-08-10 ENCOUNTER — Encounter: Payer: Self-pay | Admitting: Cardiology

## 2019-08-10 VITALS — BP 152/80 | HR 71 | Ht 67.0 in | Wt 194.6 lb

## 2019-08-10 DIAGNOSIS — I214 Non-ST elevation (NSTEMI) myocardial infarction: Secondary | ICD-10-CM | POA: Diagnosis not present

## 2019-08-10 DIAGNOSIS — I10 Essential (primary) hypertension: Secondary | ICD-10-CM

## 2019-08-10 DIAGNOSIS — E785 Hyperlipidemia, unspecified: Secondary | ICD-10-CM

## 2019-08-10 DIAGNOSIS — I2510 Atherosclerotic heart disease of native coronary artery without angina pectoris: Secondary | ICD-10-CM

## 2019-08-10 DIAGNOSIS — Z9861 Coronary angioplasty status: Secondary | ICD-10-CM

## 2019-08-10 NOTE — Progress Notes (Signed)
Primary Care Provider: Carlena Hurl, PA-C Cardiologist: [name], MD Electrophysiologist: None  Clinic Note: Chief Complaint  Patient presents with  . Hospitalization Follow-up    Post cath  . Coronary Artery Disease    Patent stents, otherwise normal cath    HPI:    Jay Fuller is a 64 y.o. male with a history of CAD having PCI below who presents today for 2-3-week post cath follow-up.   CAD Hx: NSTEMI 2015 s/p DES x 2 OM1/CFX  Reestablish care with [facility] in March 2019 (prior to that had not been seen since initial post hospital follow-up with Ignacia Bayley, NP; his PCP is taking good care of his hypertension and hyperlipidemia.  Other PMH: remote tob/THC/cocaine, HTN, HLD, Hep C s/p Harvoni, cirrhosis  Adiv Bonafede was last seen by Rosaria Ferries, PA on July 11, 2019 via telemedicine with complaints of chest pain and shortness of breath with exertion.  Symptoms are limited to exertional only and resolved after 5 to 10 minutes rest.  Symptoms were similar to prior anginal equivalent.  After discussion with clinic DOD, the decision was made to schedule for cardiac catheterization.  Recent Hospitalizations:   Outpatient Cardiac Catheterization 07/24/2019  Reviewed  CV studies:    The following studies were reviewed today: (if available, images/films reviewed: From Epic Chart or Care Everywhere) . Cath 07/24/2019: Two-vessel CAD with widely patent stents in OM1 and distal LCx-OM 3.  Otherwise minimal disease. No obvious culprit lesion. Normal EF and EDP.   Diagnostic Dominance: Right    Interval History:   Shawndel Shadwell returns here today for post-cath follow-up stating that he is doing much better.  His chest pain is really a lot better and he is not really having any troublesome symptoms at this point.  He still is a little bit tired and he has some exertional dyspnea, but nothing at all like he had prior to his cath.  He does note  that his blood pressure may be little higher today-was rushing to get in and has not had his medicines today.  Says usually at home his systolic pressures are in the 120 to 130 mmHg range.  Cardiovascular Review of Symptoms (Summary): positive for - Much less notable chest discomfort, rare, and not associated with particular activity.  Mild dyspnea on exertion, also better. negative for - edema, irregular heartbeat, orthopnea, palpitations, paroxysmal nocturnal dyspnea, rapid heart rate, shortness of breath or Syncope/near syncope, TIA/amaurosis fugax .  Claudication  The patient does not have symptoms concerning for COVID-19 infection (fever, chills, cough, or new shortness of breath).  The patient is practicing social distancing & Masking.   He had first Covid vaccine injection on March 6.  REVIEWED OF SYSTEMS   Review of Systems  Constitutional: Negative for malaise/fatigue and weight loss.  HENT: Negative for congestion.   Respiratory: Negative for shortness of breath.   Gastrointestinal: Negative for blood in stool.  Genitourinary: Negative for hematuria.  Musculoskeletal: Negative for falls and myalgias.  Neurological: Negative for dizziness and headaches.  Psychiatric/Behavioral: Negative for memory loss. The patient is not nervous/anxious and does not have insomnia.    I have reviewed and (if needed) personally updated the patient's problem list, medications, allergies, past medical and surgical history, social and family history.   PAST MEDICAL HISTORY   Past Medical History:  Diagnosis Date  . CAD S/P percutaneous coronary angioplasty 06/2013   a. NSTEMI/Cath/PCI: LM nl, dLAD 20-40%, Diags small with ~95%; pLCx 20%, dLCx  99% * (DES PCI - 2.25x16 Promus Premier DES) & pOM1 80-90% (DES PCI: 2.5x20 Promus Premier DES), OM2 min irregs, LPL1 small, min irregs, RCA/RPDA min irregs, RPL nl, EF 55%.  . Chronic pain   . 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 Former smoker    quit 2019    . Hepatitis B immune 2016  . Hepatitis C    s/p Harvoni therapy 2015  . History of NON-ST ELEVATION MI 06/2013   99% dLCx (DES PCI); 80-90% OM1 (DES PCI) --normal EF.  Normal wall motion.;   . HTN (hypertension)   . Hyperlipidemia   . Polysubstance abuse ([place])    history of cocaine, marijuana, tobacco abuse in the use  . Unstable angina ([place]) 07/11/2019  . Wears glasses     PAST SURGICAL HISTORY   Past Surgical History:  Procedure Laterality Date  . COLONOSCOPY     denied, but Cologuard negative 12/2013  . CORONARY ANGIOPLASTY WITH STENT PLACEMENT  06/2013    dLCx 99% going into LPL1 (DES PCI: 2.25x16 Promus Premier DES), p OM1 80-90% (DES PCI - 2.5x20 Promus Premier DES)  . LEFT HEART CATH AND CORONARY ANGIOGRAPHY N/A 07/18/2019   Procedure: LEFT HEART CATH AND CORONARY ANGIOGRAPHY;  Surgeon: Leonie Man, MD;  Location: [facility];  Service: Cardiovascular;  Laterality: N/A;  . LEFT HEART CATHETERIZATION WITH CORONARY ANGIOGRAM N/A 06/23/2013   Procedure: LEFT HEART CATHETERIZATION WITH CORONARY ANGIOGRAM;  Surgeon: Leonie Man, MD;  Location: [facility] CATH LAB:: LM nl, d LAD 20-40%, small caliber Diags w/ severel ~95% lesions, p LCX 20%, dLCx 99% going into LPL1 (PCI- DES), p OM1 80-90% (PCI -DES), OM2 min irregs, LPL1 small, min irregs, RCA/RPDA min irregs, RPL nl, EF 55%.  [name] LIVER BIOPSY  2016  . TRANSTHORACIC ECHOCARDIOGRAM  06/2013   Normal LV size and moderate thickness/moderate LVH.  Normal EF 55-60%. No RWMA. Gr 1 DD. Mlid LA dilation    MEDICATIONS/ALLERGIES   Current Meds  Medication Sig  . diphenhydrAMINE (SOMINEX) 25 MG tablet Take 25 mg by mouth at bedtime.   . isosorbide mononitrate (IMDUR) 30 MG 24 hr tablet TAKE 1 TABLET(30 MG) BY MOUTH DAILY (Patient taking differently: Take 30 mg by mouth daily. )  . nitroGLYCERIN (NITROSTAT) 0.4 MG SL tablet PLACE 1 TABLET UNDER THE TONGUE EVERY 5 MINURES FOR 3 DOSES AS NEEDED FOR CHEST PAIN (Patient not taking:  Reported on 08/11/2019)  . [DISCONTINUED] amLODipine (NORVASC) 10 MG tablet TAKE 1 TABLET BY MOUTH EVERY DAY (Patient taking differently: Take 10 mg by mouth daily. )  . [DISCONTINUED] aspirin 81 MG chewable tablet One by mouth daily.  . [DISCONTINUED] atorvastatin (LIPITOR) 20 MG tablet TAKE 1 TABLET DAILY (Patient taking differently: Take 20 mg by mouth daily. )  . [DISCONTINUED] carvedilol (COREG) 25 MG tablet TAKE 1 TABLET(25 MG) BY MOUTH TWICE DAILY WITH A MEAL (Patient taking differently: Take 25 mg by mouth 2 (two) times daily with a meal. )  . [DISCONTINUED] clopidogrel (PLAVIX) 75 MG tablet TAKE 1 TABLET BY MOUTH EVERY DAY (Patient taking differently: Take 75 mg by mouth daily. )  . [DISCONTINUED] irbesartan-hydrochlorothiazide (AVALIDE) 300-12.5 MG tablet TAKE 1 TABLET BY MOUTH ONCE DAILY *EMERGENCY REFILL*  . [DISCONTINUED] umeclidinium-vilanterol (ANORO ELLIPTA) 62.5-25 MCG/INH AEPB Inhale 1 puff into the lungs daily.    No Known Allergies  SOCIAL HISTORY/FAMILY HISTORY   Reviewed in Epic:  Pertinent findings: No changes  OBJCTIVE -PE, EKG,  labs   Wt Readings from Last 3 Encounters:  08/11/19 194 lb 9.6 oz (88.3 kg)  08/10/19 194 lb 9.6 oz (88.3 kg)  07/18/19 193 lb (87.5 kg)    Physical Exam: BP (!) 152/80   Pulse 71   Ht 5\' 7"  (1.702 m)   Wt 194 lb 9.6 oz (88.3 kg)   SpO2 98%   BMI 30.48 kg/m  Physical Exam  Constitutional: He is oriented to person, place, and time. He appears well-developed and well-nourished. No distress.  Healthy-appearing.  Well-groomed  HENT:  Head: Normocephalic and atraumatic.  Neck: No JVD present.  Cardiovascular: Normal rate, regular rhythm, normal heart sounds and intact distal pulses. Exam reveals no gallop and no friction rub.  No murmur heard. Pulmonary/Chest: Effort normal and breath sounds normal. No respiratory distress. He has no wheezes. He has no rales.  Abdominal: Soft. Bowel sounds are normal. He exhibits no distension.  There is no abdominal tenderness.  Musculoskeletal:        General: No edema. Normal range of motion.     Cervical back: Normal range of motion and neck supple.  Neurological: He is alert and oriented to person, place, and time.  Psychiatric: He has a normal mood and affect. His behavior is normal. Judgment and thought content normal.  Vitals reviewed.    Adult ECG Report n/a Recent Labs:    Lab Results  Component Value Date   CHOL 176 08/11/2019   HDL 43 08/11/2019   LDLCALC 65 08/11/2019   TRIG 442 (H) 08/11/2019   CHOLHDL 4.1 08/11/2019    Lab Results  Component Value Date   CREATININE 1.10 08/11/2019   BUN 14 08/11/2019   NA 139 08/11/2019   K 3.8 08/11/2019   CL 102 08/11/2019   CO2 22 08/11/2019   Lab Results  Component Value Date   TSH 1.840 06/23/2013    ASSESSMENT/PLAN    Problem List Items Addressed This Visit    CAD S/P percutaneous coronary angioplasty - Primary (Chronic)    Had PCI to distal circumflex and OM1 back in 2015.  Stents patent on cath in March.  That was done for chest pain which was probably not anginal in nature given the nature of his coronary disease.  Plan  He is on 10 mg amlodipine along with carvedilol 25 mg twice daily for antianginal/cardiac benefit.  Since he is no longer having angina, I think we can wean him off of the Imdur, but would like for him to continue for 3 months and then start to wean off.  Continue ARB-HCTZ  He is on aspirin Plavix.  Can stop aspirin.  Continue maintenance dose Plavix.  Okay to hold Plavix 5-7 days preop for surgery or procedure.  Continue statin      Hyperlipidemia with target low density lipoprotein (LDL) cholesterol less than 70 mg/dL (Chronic)    Lipid panel and CMP ordered today  He is on stable dose of atorvastatin with relatively well-controlled LDL until now.  Triglycerides have been a little high,  We will plan to reevaluate lipids in September, if triglycerides are still high, may  want to consider Vascepa.      Essential hypertension (Chronic)    Blood pressure is high today.  Of asked that he monitor his pressures at home just to make sure that he is actually is well controlled to think he is.  He did not take his medicines yet this morning.  He is on high doses of amlodipine,  carvedilol and combination irbesartan and HCTZ. Next I think were probably okay not adding any medication at this time.        NSTEMI (non-ST elevated myocardial infarction) (HCC) (Chronic)    No regional wall motion abnormality noted on echo.  Stents are patent by recent cath.  Last cath was done for possible unstable angina.  Relatively reassuring results.  On a relatively stable regimen.          COVID-19 Education: The signs and symptoms of COVID-19 were discussed with the patient and how to seek care for testing (follow up with PCP or arrange E-visit.   The importance of social distancing was discussed today.  I spent a total of 65minutes with the patient. >  50% of the time was spent in direct patient consultation.  Additional time spent with chart review  / charting (studies, outside notes, etc): 8 Total Time: 24 min   Current medicines are reviewed at length with the patient today.  (+/- concerns) n/a  Notice: This dictation was prepared with Dragon dictation along with smaller phrase technology. Any transcriptional errors that result from this process are unintentional and may not be corrected upon review.  Patient Instructions / Medication Changes & Studies & Tests Ordered   Patient Instructions  Medication Instructions:  STOP  ASPIRIN    STARTING July 2021  DECREASE  TAKING Imdur ( isosorbide mono)  to 1/2 tablet of 30 mg   A day  For one month , if you do nt have any chest discomfort  The stop taking the medication in Aug.  If you have to use nitroglycerin 0.4 mg sublingual the restart taking Imdur  30 mg    *If you need a refill on your cardiac medications before  your next appointment, please call your pharmacy*   Lab Work: Not needed    Testing/Procedures: Not needed   Follow-Up: At [facility], you and your health needs are our priority.  As part of our continuing mission to provide you with exceptional heart care, we have created designated Provider Care Teams.  These Care Teams include your primary Cardiologist (physician) and Advanced Practice Providers (APPs -  Physician Assistants and Nurse Practitioners) who all work together to provide you with the care you need, when you need it.  We recommend signing up for the patient portal called "MyChart".  Sign up information is provided on this After Visit Summary.  MyChart is used to connect with patients for Virtual Visits (Telemedicine).  Patients are able to view lab/test results, encounter notes, upcoming appointments, etc.  Non-urgent messages can be sent to your provider as well.   To learn more about what you can do with MyChart, go to [email or web address].    Your next appointment:   6 month(s)  The format for your next appointment:   In Person  Provider:   Rosaria Ferries, PA-C  12 months with DR [facility]   Other Instructions     Studies Ordered:   No orders of the defined types were placed in this encounter.    [name], M.D., M.S. Interventional Cardiologist   Pager # [phone] Phone # [phone] [address] [place], [place] [address]   Thank you for choosing Heartcare at [facility]!!

## 2019-08-10 NOTE — Patient Instructions (Signed)
Medication Instructions:  STOP  ASPIRIN    STARTING July 2021  DECREASE  TAKING Imdur ( isosorbide mono)  to 1/2 tablet of 30 mg   A day  For one month , if you do nt have any chest discomfort  The stop taking the medication in Aug.  If you have to use nitroglycerin 0.4 mg sublingual the restart taking Imdur  30 mg    *If you need a refill on your cardiac medications before your next appointment, please call your pharmacy*   Lab Work: Not needed    Testing/Procedures: Not needed   Follow-Up: At [facility], you and your health needs are our priority.  As part of our continuing mission to provide you with exceptional heart care, we have created designated Provider Care Teams.  These Care Teams include your primary Cardiologist (physician) and Advanced Practice Providers (APPs -  Physician Assistants and Nurse Practitioners) who all work together to provide you with the care you need, when you need it.  We recommend signing up for the patient portal called "MyChart".  Sign up information is provided on this After Visit Summary.  MyChart is used to connect with patients for Virtual Visits (Telemedicine).  Patients are able to view lab/test results, encounter notes, upcoming appointments, etc.  Non-urgent messages can be sent to your provider as well.   To learn more about what you can do with MyChart, go to [email or web address].    Your next appointment:   6 month(s)  The format for your next appointment:   In Person  Provider:   Rosaria Ferries, PA-C  12 months with DR [facility]   Other Instructions

## 2019-08-11 ENCOUNTER — Encounter: Payer: Self-pay | Admitting: Medical

## 2019-08-11 ENCOUNTER — Other Ambulatory Visit: Payer: Self-pay

## 2019-08-11 ENCOUNTER — Ambulatory Visit (INDEPENDENT_AMBULATORY_CARE_PROVIDER_SITE_OTHER): Payer: Medicare Other | Admitting: Medical

## 2019-08-11 ENCOUNTER — Encounter: Payer: Self-pay | Admitting: Gastroenterology

## 2019-08-11 VITALS — BP 132/70 | HR 75 | Temp 98.6°F | Ht 67.0 in | Wt 194.6 lb

## 2019-08-11 DIAGNOSIS — Z87891 Personal history of nicotine dependence: Secondary | ICD-10-CM

## 2019-08-11 DIAGNOSIS — K219 Gastro-esophageal reflux disease without esophagitis: Secondary | ICD-10-CM

## 2019-08-11 DIAGNOSIS — Z8619 Personal history of other infectious and parasitic diseases: Secondary | ICD-10-CM

## 2019-08-11 DIAGNOSIS — Z125 Encounter for screening for malignant neoplasm of prostate: Secondary | ICD-10-CM

## 2019-08-11 DIAGNOSIS — Z0000 Encounter for general adult medical examination without abnormal findings: Secondary | ICD-10-CM

## 2019-08-11 DIAGNOSIS — N5089 Other specified disorders of the male genital organs: Secondary | ICD-10-CM | POA: Insufficient documentation

## 2019-08-11 DIAGNOSIS — Z7189 Other specified counseling: Secondary | ICD-10-CM

## 2019-08-11 DIAGNOSIS — R0989 Other specified symptoms and signs involving the circulatory and respiratory systems: Secondary | ICD-10-CM

## 2019-08-11 DIAGNOSIS — R7301 Impaired fasting glucose: Secondary | ICD-10-CM

## 2019-08-11 DIAGNOSIS — K7460 Unspecified cirrhosis of liver: Secondary | ICD-10-CM

## 2019-08-11 DIAGNOSIS — Z129 Encounter for screening for malignant neoplasm, site unspecified: Secondary | ICD-10-CM

## 2019-08-11 DIAGNOSIS — Z7185 Encounter for immunization safety counseling: Secondary | ICD-10-CM

## 2019-08-11 DIAGNOSIS — Z1211 Encounter for screening for malignant neoplasm of colon: Secondary | ICD-10-CM

## 2019-08-11 DIAGNOSIS — E785 Hyperlipidemia, unspecified: Secondary | ICD-10-CM

## 2019-08-11 DIAGNOSIS — I252 Old myocardial infarction: Secondary | ICD-10-CM

## 2019-08-11 NOTE — Progress Notes (Signed)
Subjective:    Jay Fuller is a 64 y.o. male who presents for Preventative Services visit and chronic medical problems/med check visit.    Primary Care Provider Rotha Cassels, Camelia Eng, PA-C here for primary care  Current Health Care Team:  Dentist, N/A  Eye doctor, N/A  Dr. [name], cardiology   Medical Services you may have received from other than [facility] providers in the past year (date may be approximate) none  Exercise Current exercise habits: walks 45 minutes 5 days a week.  Nutrition/Diet Current diet: well balanced  Depression Screen Depression screen [facility] 2/9 08/11/2019  Decreased Interest 0  Down, Depressed, Hopeless 0  PHQ - 2 Score 0    Activities of Daily Living Screen/Functional Status Survey Is the patient deaf or have difficulty hearing?: No Does the patient have difficulty seeing, even when wearing glasses/contacts?: Yes Does the patient have difficulty concentrating, remembering, or making decisions?: No Does the patient have difficulty walking or climbing stairs?: No Does the patient have difficulty dressing or bathing?: No Does the patient have difficulty doing errands alone such as visiting a doctor's office or shopping?: No  Can patient draw a clock face showing 3:15 oclock, yes  Fall Risk Screen Fall Risk  08/11/2019 02/24/2018 01/20/2017  Falls in the past year? 0 No No    Gait Assessment: Normal gait observed - yes  Advanced directives Does patient have a Teresita? No Does patient have a Living Will? No  Past Medical History:  Diagnosis Date   CAD S/P percutaneous coronary angioplasty 06/2013   a. NSTEMI/Cath/PCI: LM nl, dLAD 20-40%, Diags small with ~95%; pLCx 20%, dLCx 99% * (DES PCI - 2.25x16 Promus Premier DES) & pOM1 80-90% (DES PCI: 2.5x20 Promus Premier DES), OM2 min irregs, LPL1 small, min irregs, RCA/RPDA min irregs, RPL nl, EF 55%.   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  Normal wall motion.;    HTN (hypertension)    Hyperlipidemia    Polysubstance abuse ([place])    history of cocaine, marijuana, tobacco abuse in the use   Unstable angina ([place]) 07/11/2019   Wears glasses     Past Surgical History:  Procedure Laterality Date   COLONOSCOPY     denied, but Cologuard negative 12/2013   CORONARY ANGIOPLASTY WITH STENT PLACEMENT  06/2013    dLCx 99% going into LPL1 (DES PCI: 2.25x16 Promus Premier DES), p OM1 80-90% (DES PCI - 2.5x20 Promus Premier DES)   LEFT HEART CATH AND CORONARY ANGIOGRAPHY N/A 07/18/2019   Procedure: LEFT HEART CATH AND CORONARY ANGIOGRAPHY;  Surgeon: Leonie Man, MD;  Location: [facility];  Service: Cardiovascular;  Laterality: N/A;   LEFT HEART CATHETERIZATION WITH CORONARY ANGIOGRAM N/A 06/23/2013   Procedure: LEFT HEART CATHETERIZATION WITH CORONARY ANGIOGRAM;  Surgeon: Leonie Man, MD;  Location: [facility] CATH LAB:: LM nl, d LAD 20-40%, small caliber Diags w/ severel ~95% lesions, p LCX 20%, dLCx 99% going into LPL1 (PCI- DES), p OM1 80-90% (PCI -DES), OM2 min irregs, LPL1 small, min irregs, RCA/RPDA min irregs, RPL nl, EF 55%.   LIVER BIOPSY  2016   TRANSTHORACIC ECHOCARDIOGRAM  06/2013   Normal  LV size and moderate thickness/moderate LVH.  Normal EF 55-60%. No RWMA. Gr 1 DD. Mlid LA dilation    Social History   Socioeconomic History   Marital status: Single    Spouse name: Not on file   Number of children: Not on file   Years of education: Not on file   Highest education level: Not on file  Occupational History   Not on file  Tobacco Use   Smoking status: Former Smoker    Packs/day: 0.50    Years: 25.00    Pack years: 12.50    Quit date: 01/21/2016    Years since quitting: 3.5   Smokeless tobacco: Never Used   Substance and Sexual Activity   Alcohol use: Yes    Alcohol/week: 2.0 standard drinks    Types: 2 Cans of beer per week   Drug use: Not Currently    Comment: cocain and marijuana in the past   Sexual activity: Yes  Other Topics Concern   Not on file  Social History Narrative   He does get somes some exercise with walking 1- 1 1/2 miles /day, currently painting cars part time.   Lives with girlfriend and son   Social Determinants of Health   Financial Resource Strain:    Difficulty of Paying Living Expenses:   Food Insecurity:    Worried About Charity fundraiser in the Last Year:    Arboriculturist in the Last Year:   Transportation Needs:    Film/video editor (Medical):    Lack of Transportation (Non-Medical):   Physical Activity:    Days of Exercise per Week:    Minutes of Exercise per Session:   Stress:    Feeling of Stress :   Social Connections:    Frequency of Communication with Friends and Family:    Frequency of Social Gatherings with Friends and Family:    Attends Religious Services:    Active Member of Clubs or Organizations:    Attends Music therapist:    Marital Status:   Intimate Partner Violence:    Fear of Current or Ex-Partner:    Emotionally Abused:    Physically Abused:    Sexually Abused:     Family History  Problem Relation Age of Onset   Hypertension Mother    Heart disease Father    Hypertension Father    Hypertension Sister    Stroke Sister    Hypertension Sister    Cancer Sister        pancreatic   Heart disease Sister        CHF   Hypertension Sister    Diabetes Neg Hx      Current Outpatient Medications:    amLODipine (NORVASC) 10 MG tablet, TAKE 1 TABLET BY MOUTH EVERY DAY (Patient taking differently: Take 10 mg by mouth daily. ), Disp: 30 tablet, Rfl: 10   atorvastatin (LIPITOR) 20 MG tablet, TAKE 1 TABLET DAILY (Patient taking differently: Take 20 mg by mouth daily. ), Disp:  30 tablet, Rfl: 10   carvedilol (COREG) 25 MG tablet, TAKE 1 TABLET(25 MG) BY MOUTH TWICE DAILY WITH A MEAL (Patient taking differently: Take 25 mg by mouth 2 (two) times daily with a meal. ), Disp: 180 tablet, Rfl: 0   clopidogrel (PLAVIX) 75 MG tablet, TAKE 1 TABLET BY MOUTH EVERY DAY (Patient taking differently: Take 75 mg by mouth daily. ), Disp: 30 tablet, Rfl: 10   diphenhydrAMINE (SOMINEX) 25 MG tablet, Take  25 mg by mouth at bedtime. , Disp: , Rfl:    irbesartan-hydrochlorothiazide (AVALIDE) 300-12.5 MG tablet, TAKE 1 TABLET BY MOUTH ONCE DAILY *EMERGENCY REFILL*, Disp: 30 tablet, Rfl: 10   isosorbide mononitrate (IMDUR) 30 MG 24 hr tablet, TAKE 1 TABLET(30 MG) BY MOUTH DAILY (Patient taking differently: Take 30 mg by mouth daily. ), Disp: 90 tablet, Rfl: 3   nitroGLYCERIN (NITROSTAT) 0.4 MG SL tablet, PLACE 1 TABLET UNDER THE TONGUE EVERY 5 MINURES FOR 3 DOSES AS NEEDED FOR CHEST PAIN (Patient not taking: Reported on 08/11/2019), Disp: 30 tablet, Rfl: 0  No Known Allergies  History reviewed: allergies, current medications, past family history, past medical history, past social history, past surgical history and problem list  Chronic issues discussed: Hypertension-compliant with amlodipine 10 mg daily, carvedilol 25 mg twice daily, irbesartan HCT 300/12.5 mg daily  Compliant with Lipitor 20 mg daily without complaint  He saw cardiology yesterday and they have a plan to wean off Imdur  Compliant with other medicines as usual.  Exercise with walking.  Otherwise has been in good health without complaint  Acute issues discussed: none  Objective:      Biometrics BP 132/70    Pulse 75    Temp 98.6 F (37 C)    Ht 5\' 7"  (1.702 m)    Wt 194 lb 9.6 oz (88.3 kg)    SpO2 96%    BMI 30.48 kg/m    Wt Readings from Last 3 Encounters:  08/11/19 194 lb 9.6 oz (88.3 kg)  08/10/19 194 lb 9.6 oz (88.3 kg)  07/18/19 193 lb (87.5 kg)     Cognitive Testing  Alert? Yes  Normal  Appearance?Yes  Oriented to person? Yes  Place? Yes   Time? Yes  Recall of three objects?  Yes  Can perform simple calculations? Yes  Displays appropriate judgment?Yes  Can read the correct time from a watch face?Yes  General appearance: alert, no distress, WD/WN, African American male  Nutritional Status: Inadequate calore intake? no Loss of muscle mass? no Loss of fat beneath skin? no Localized or general edema? no Diminished functional status? no  Other pertinent exam: Neck: supple, no lymphadenopathy, no thyromegaly, no masses, no bruits Heart: RRR, normal S1, S2, no murmurs Lungs: CTA bilaterally, no wheezes, rhonchi, or rales Abdomen: +bs, soft, non tender, non distended, no masses, no hepatomegaly, no splenomegaly Musculoskeletal: nontender, no swelling, no obvious deformity Extremities: no edema, no cyanosis, no clubbing Pulses: 2+ symmetric, upper and lower extremities, normal cap refill Neurological: alert, oriented x 3, CN2-12 intact, strength normal upper extremities and lower extremities, sensation normal throughout, DTRs 2+ throughout, no cerebellar signs, gait normal Psychiatric: normal affect, behavior normal, pleasant  Ext: no edema GU: normal male, uncir, small 92mm roundish nodule on superior right testicle,  No other mass or lymphadenopathy DRE: anus normal tone, prostate WNL   Assessment:   Encounter Diagnoses  Name Primary?   Encounter for health maintenance examination in adult Yes   Medicare annual wellness visit, subsequent    History of hepatitis C    Cirrhosis of liver without ascites, unspecified hepatic cirrhosis type ([place])    Gastroesophageal reflux disease, unspecified whether esophagitis present    Impaired fasting blood sugar    Hyperlipidemia with target low density lipoprotein (LDL) cholesterol less than 70 mg/dL    History of MI (myocardial infarction)    Former smoker    Bruit    Decreased pulses in feet    Vaccine  counseling  Screen for colon cancer    Screening for prostate cancer    Screening for cancer    Testicular mass      Plan:   A preventative services visit was completed today.  During the course of the visit today, we discussed and counseled about appropriate screening and preventive services.  A health risk assessment was established today that included a review of current medications, allergies, social history, family history, medical and preventative health history, biometrics, and preventative screenings to identify potential safety concerns or impairments.  A personalized plan was printed today for your records and use.   Personalized health advice and education was given today to reduce health risks and promote self management and wellness.  Information regarding end of life planning was discussed today.  Conditions/risks identified/Chronic problems discussed today: History of hepatitis C and liver cirrhosis -status post Harvoni treatment, hepatitis B immune, has been vaccinated with hepatitis A vaccine.  Advised updated liver ultrasound which we will schedule.  Routine labs today including alpha-fetoprotein screen.  Referrals for updated endoscopy and colonoscopy to screen for varices and colon cancer.  Cologuard -2016  Hypertension, coronary artery disease, history of non-STEMI, dyslipidemia-compliant with current medications, fasting labs today, reviewed his cardiology consult follow-up from yesterday and recent cardiac cath.  He was instructed to wean down on Imdur as long as his chest pains do not recur  Impaired glucose-fasting labs today  Obesity-counseled on diet exercise and need for weight loss  There was a small testicular lump palpated today, likely benign-advised to check this monthly and if any changes let me know right away  Discussed risk and benefits of prostate cancer screening, PSA today  Decreased pulses in feet-consider ABIs.  He does not have any symptoms  of claudication at this time.  He never went for ABIs the last time we scheduled this   Acute problems discussed today: none  Recommendations:  I recommend a yearly ophthalmology/optometry visit for glaucoma screening and eye checkup  I recommended a yearly dental visit for hygiene and checkup  Advanced directives - discussed nature and purpose of Advanced Directives, encouraged them to complete them if they have not done so and/or encouraged them to get [place] a copy if they have done this already.    Referrals today: GI  Immunizations: I recommended a yearly influenza vaccine, typically in September when the vaccine is usually available Up-to-date on tetanus and pneumococcal 23 He will be due for Prevnar 13 next year He just recently finished the Covid vaccine series Shingles vaccine:  I recommend you have a shingles vaccine to help prevent shingles or herpes zoster outbreak.   Please call your insurer to inquire about coverage for the Shingrix vaccine given in 2 doses.   Some insurers cover this vaccine after age 59, some cover this after age 4.  If your insurer covers this, then call to schedule appointment to have this vaccine here.  Jamual was seen today for [employer].  Diagnoses and all orders for this visit:  Encounter for health maintenance examination in adult -     Hepatic function panel -     Renal Function Panel -     Lipid panel -     Hemoglobin A1c -     AFP tumor marker -     CBC with Differential/Platelet -     PSA  Medicare annual wellness visit, subsequent  History of hepatitis C -     Hepatic function panel -  Renal Function Panel -     AFP tumor marker -     CBC with Differential/Platelet -     US Abdomen Complete; Future -     Ambulatory referral to Gastroenterology  Cirrhosis of liver without ascites, unspecified hepatic cirrhosis type ([place]) -     Hepatic function panel -     Renal Function Panel -     AFP tumor marker -     CBC  with Differential/Platelet -     US Abdomen Complete; Future -     Ambulatory referral to Gastroenterology  Gastroesophageal reflux disease, unspecified whether esophagitis present  Impaired fasting blood sugar -     Renal Function Panel -     Hemoglobin A1c  Hyperlipidemia with target low density lipoprotein (LDL) cholesterol less than 70 mg/dL -     Lipid panel  History of MI (myocardial infarction) -     Lipid panel  Former smoker  Bruit  Decreased pulses in feet  Vaccine counseling  Screen for colon cancer -     Ambulatory referral to Gastroenterology  Screening for prostate cancer -     PSA  Screening for cancer -     AFP tumor marker -     US Abdomen Complete; Future -     Ambulatory referral to Gastroenterology  Testicular mass   Spent > 45 minutes face to face with patient in discussion of symptoms, evaluation, plan and recommendations.     Medicare Attestation A preventative services visit was completed today.  During the course of the visit the patient was educated and counseled about appropriate screening and preventive services.  A health risk assessment was established with the patient that included a review of current medications, allergies, social history, family history, medical and preventative health history, biometrics, and preventative screenings to identify potential safety concerns or impairments.  A personalized plan was printed today for the patient's records and use.   Personalized health advice and education was given today to reduce health risks and promote self management and wellness.  Information regarding end of life planning was discussed today.  Dorothea Ogle, PA-C   08/11/2019

## 2019-08-11 NOTE — Patient Instructions (Signed)
Recommendations:  See your eye doctor yearly for routine vision care.  EYE DOCTOR RECOMMENDATIONS  [place], [place], [place] [address] Phone: [phone] [email or web address]   [facility] Dr. Camillo Flaming [address], [place], [place] [address]  [email or web address]   Fabio Pierce, M.D. Corena Herter, O.D. [place], [place], [place] [address] Medical telephone: [phone] Optical telephone: [phone]   Dr. Webb Laws [address] [place], [place] [address] [phone]   See your dentist yearly for routine dental care including hygiene visits twice yearly.  DENTIST RECOMMENDATION Dr. Jonna Coup, dentist [address] [place], [place] [address] [phone] [email or web address]    We will refer you to gastroenterology as you are due for Endoscopy and Colonoscopy   Due to history of hepatitis, you should have ultrasound of abdomen yearly as well as routine labs yearly    Shingles vaccine:  I recommend you have a shingles vaccine to help prevent shingles or herpes zoster outbreak.   Please call your insurer to inquire about coverage for the Shingrix vaccine given in 2 doses.   Some insurers cover this vaccine after age 32, some cover this after age 26.  If your insurer covers this, then call to schedule appointment to have this vaccine here.    Complete your advanced directives   Advance Directive  Advance directives are legal documents that let you make choices ahead of time about your health care and medical treatment in case you become unable to communicate for yourself. Advance directives are a way for you to make known your wishes to family, friends, and health care providers. This can let others know about your end-of-life care if you become unable to communicate. Discussing and writing advance directives should happen over time  rather than all at once. Advance directives can be changed depending on your situation and what you want, even after you have signed the advance directives. There are different types of advance directives, such as:  Medical power of attorney.  Living will.  Do not resuscitate (DNR) or do not attempt resuscitation (DNAR) order. Health care proxy and medical power of attorney A health care proxy is also called a health care agent. This is a person who is appointed to make medical decisions for you in cases where you are unable to make the decisions yourself. Generally, people choose someone they know well and trust to represent their preferences. Make sure to ask this person for an agreement to act as your proxy. A proxy may have to exercise judgment in the event of a medical decision for which your wishes are not known. A medical power of attorney is a legal document that names your health care proxy. Depending on the laws in your state, after the document is written, it may also need to be:  Signed.  Notarized.  Dated.  Copied.  Witnessed.  Incorporated into your medical record. You may also want to appoint someone to manage your money in a situation in which you are unable to do so. This is called a durable power of attorney for finances. It is a separate legal document from the durable power of attorney for health care. You may choose the same person or someone different from your health care proxy to act as your agent in money matters. If you do not appoint a proxy, or if there is a concern that the proxy is not acting  in your best interests, a court may appoint a guardian to act on your behalf. Living will A living will is a set of instructions that state your wishes about medical care when you cannot express them yourself. Health care providers should keep a copy of your living will in your medical record. You may want to give a copy to family members or friends. To alert caregivers in  case of an emergency, you can place a card in your wallet to let them know that you have a living will and where they can find it. A living will is used if you become:  Terminally ill.  Disabled.  Unable to communicate or make decisions. Items to consider in your living will include:  To use or not to use life-support equipment, such as dialysis machines and breathing machines (ventilators).  A DNR or DNAR order. This tells health care providers not to use cardiopulmonary resuscitation (CPR) if breathing or heartbeat stops.  To use or not to use tube feeding.  To be given or not to be given food and fluids.  Comfort (palliative) care when the goal becomes comfort rather than a cure.  Donation of organs and tissues. A living will does not give instructions for distributing your money and property if you should pass away. DNR or DNAR A DNR or DNAR order is a request not to have CPR in the event that your heart stops beating or you stop breathing. If a DNR or DNAR order has not been made and shared, a health care provider will try to help any patient whose heart has stopped or who has stopped breathing. If you plan to have surgery, talk with your health care provider about how your DNR or DNAR order will be followed if problems occur. What if I do not have an advance directive? If you do not have an advance directive, some states assign family decision makers to act on your behalf based on how closely you are related to them. Each state has its own laws about advance directives. You may want to check with your health care provider, attorney, or state representative about the laws in your state. Summary  Advance directives are the legal documents that allow you to make choices ahead of time about your health care and medical treatment in case you become unable to tell others about your care.  The process of discussing and writing advance directives should happen over time. You can change the  advance directives, even after you have signed them.  Advance directives include DNR or DNAR orders, living wills, and designating an agent as your medical power of attorney. This information is not intended to replace advice given to you by your health care provider. Make sure you discuss any questions you have with your health care provider. Document Revised: 11/18/2018 Document Reviewed: 11/18/2018 Elsevier Patient Education  [place].

## 2019-08-12 ENCOUNTER — Other Ambulatory Visit: Payer: Self-pay | Admitting: Medical

## 2019-08-12 DIAGNOSIS — I10 Essential (primary) hypertension: Secondary | ICD-10-CM

## 2019-08-12 DIAGNOSIS — E785 Hyperlipidemia, unspecified: Secondary | ICD-10-CM

## 2019-08-12 DIAGNOSIS — I2510 Atherosclerotic heart disease of native coronary artery without angina pectoris: Secondary | ICD-10-CM

## 2019-08-12 DIAGNOSIS — Z9861 Coronary angioplasty status: Secondary | ICD-10-CM

## 2019-08-12 LAB — LIPID PANEL
Chol/HDL Ratio: 4.1 ratio (ref 0.0–5.0)
Cholesterol, Total: 176 mg/dL (ref 100–199)
HDL: 43 mg/dL (ref >39)
LDL Chol Calc (NIH): 65 mg/dL (ref 0–99)
Triglycerides: 442 mg/dL — ABNORMAL HIGH (ref 0–149)
VLDL Cholesterol Cal: 68 mg/dL — ABNORMAL HIGH (ref 5–40)

## 2019-08-12 LAB — RENAL FUNCTION PANEL
Albumin: 4.7 g/dL (ref 3.8–4.8)
BUN/Creatinine Ratio: 13 (ref 10–24)
BUN: 14 mg/dL (ref 8–27)
CO2: 22 mmol/L (ref 20–29)
Calcium: 9.8 mg/dL (ref 8.6–10.2)
Chloride: 102 mmol/L (ref 96–106)
Creatinine, Ser: 1.1 mg/dL (ref 0.76–1.27)
GFR calc Af Amer: 82 mL/min/{1.73_m2} (ref >59)
GFR calc non Af Amer: 71 mL/min/{1.73_m2} (ref >59)
Glucose: 119 mg/dL — ABNORMAL HIGH (ref 65–99)
Phosphorus: 4.4 mg/dL — ABNORMAL HIGH (ref 2.8–4.1)
Potassium: 3.8 mmol/L (ref 3.5–5.2)
Sodium: 139 mmol/L (ref 134–144)

## 2019-08-12 LAB — HEPATIC FUNCTION PANEL
ALT: 24 IU/L (ref 0–44)
AST: 25 IU/L (ref 0–40)
Alkaline Phosphatase: 83 IU/L (ref 39–117)
Bilirubin Total: 0.3 mg/dL (ref 0.0–1.2)
Bilirubin, Direct: 0.12 mg/dL (ref 0.00–0.40)
Total Protein: 7.1 g/dL (ref 6.0–8.5)

## 2019-08-12 LAB — CBC WITH DIFFERENTIAL/PLATELET
Basophils Absolute: 0 10*3/uL (ref 0.0–0.2)
Basos: 1 %
EOS (ABSOLUTE): 0.2 10*3/uL (ref 0.0–0.4)
Eos: 3 %
Hematocrit: 40.9 % (ref 37.5–51.0)
Hemoglobin: 13.7 g/dL (ref 13.0–17.7)
Immature Grans (Abs): 0 10*3/uL (ref 0.0–0.1)
Immature Granulocytes: 0 %
Lymphocytes Absolute: 2.7 10*3/uL (ref 0.7–3.1)
Lymphs: 42 %
MCH: 28.3 pg (ref 26.6–33.0)
MCHC: 33.5 g/dL (ref 31.5–35.7)
MCV: 85 fL (ref 79–97)
Monocytes Absolute: 0.5 10*3/uL (ref 0.1–0.9)
Monocytes: 8 %
Neutrophils Absolute: 2.9 10*3/uL (ref 1.4–7.0)
Neutrophils: 46 %
Platelets: 193 10*3/uL (ref 150–450)
RBC: 4.84 x10E6/uL (ref 4.14–5.80)
RDW: 14.5 % (ref 11.6–15.4)
WBC: 6.3 10*3/uL (ref 3.4–10.8)

## 2019-08-12 LAB — PSA: Prostate Specific Ag, Serum: 0.7 ng/mL (ref 0.0–4.0)

## 2019-08-12 LAB — HEMOGLOBIN A1C
Est. average glucose Bld gHb Est-mCnc: 131 mg/dL
Hgb A1c MFr Bld: 6.2 % — ABNORMAL HIGH (ref 4.8–5.6)

## 2019-08-12 LAB — AFP TUMOR MARKER: AFP, Serum, Tumor Marker: 7.5 ng/mL (ref 0.0–8.3)

## 2019-08-12 MED ORDER — ICOSAPENT ETHYL 1 G PO CAPS: 2.0000 g | ORAL_CAPSULE | Freq: Two times a day (BID) | ORAL | 11 refills | Status: DC

## 2019-08-12 MED ORDER — ATORVASTATIN CALCIUM 20 MG PO TABS: 20.0000 mg | ORAL_TABLET | Freq: Every day | ORAL | 3 refills | Status: DC

## 2019-08-12 MED ORDER — CLOPIDOGREL BISULFATE 75 MG PO TABS: 75.0000 mg | ORAL_TABLET | Freq: Every day | ORAL | 3 refills | Status: DC

## 2019-08-12 MED ORDER — AMLODIPINE BESYLATE 10 MG PO TABS: 10.0000 mg | ORAL_TABLET | Freq: Every day | ORAL | 3 refills | Status: DC

## 2019-08-12 MED ORDER — IRBESARTAN-HYDROCHLOROTHIAZIDE 300-12.5 MG PO TABS: ORAL_TABLET | ORAL | 3 refills | Status: DC

## 2019-08-12 MED ORDER — CARVEDILOL 25 MG PO TABS: 25.0000 mg | ORAL_TABLET | Freq: Two times a day (BID) | ORAL | 3 refills | Status: DC

## 2019-08-14 ENCOUNTER — Encounter: Payer: Self-pay | Admitting: Cardiology

## 2019-08-14 NOTE — Assessment & Plan Note (Addendum)
No regional wall motion abnormality noted on echo.  Stents are patent by recent cath.  Last cath was done for possible unstable angina.  Relatively reassuring results.  On a relatively stable regimen.

## 2019-08-14 NOTE — Assessment & Plan Note (Signed)
Lipid panel and CMP ordered today  He is on stable dose of atorvastatin with relatively well-controlled LDL until now.  Triglycerides have been a little high,  We will plan to reevaluate lipids in September, if triglycerides are still high, may want to consider Vascepa.

## 2019-08-14 NOTE — Assessment & Plan Note (Addendum)
Had PCI to distal circumflex and OM1 back in 2015.  Stents patent on cath in March.  That was done for chest pain which was probably not anginal in nature given the nature of his coronary disease.  Plan  He is on 10 mg amlodipine along with carvedilol 25 mg twice daily for antianginal/cardiac benefit.  Since he is no longer having angina, I think we can wean him off of the Imdur, but would like for him to continue for 3 months and then start to wean off.  Continue ARB-HCTZ  He is on aspirin Plavix.  Can stop aspirin.  Continue maintenance dose Plavix.  Okay to hold Plavix 5-7 days preop for surgery or procedure.  Continue statin

## 2019-08-14 NOTE — Assessment & Plan Note (Signed)
Blood pressure is high today.  Of asked that he monitor his pressures at home just to make sure that he is actually is well controlled to think he is.  He did not take his medicines yet this morning.  He is on high doses of amlodipine, carvedilol and combination irbesartan and HCTZ. Next I think were probably okay not adding any medication at this time.

## 2019-08-16 ENCOUNTER — Other Ambulatory Visit: Payer: Medicare Other

## 2019-09-08 ENCOUNTER — Ambulatory Visit: Payer: Medicare Other | Admitting: Gastroenterology

## 2019-10-24 ENCOUNTER — Ambulatory Visit (INDEPENDENT_AMBULATORY_CARE_PROVIDER_SITE_OTHER): Payer: Medicare Other | Admitting: Gastroenterology

## 2019-10-24 ENCOUNTER — Encounter: Payer: Self-pay | Admitting: Gastroenterology

## 2019-10-24 ENCOUNTER — Other Ambulatory Visit: Payer: Medicare Other

## 2019-10-24 VITALS — BP 126/78 | HR 88 | Ht 67.0 in | Wt 199.4 lb

## 2019-10-24 DIAGNOSIS — K7460 Unspecified cirrhosis of liver: Secondary | ICD-10-CM

## 2019-10-24 NOTE — Patient Instructions (Addendum)
Keep following up with your continue care : Roosevelt Locks - NP with [facility].   Let us know if you need anything further.   If you are age 65 or older, your body mass index should be between 23-30. Your Body mass index is 31.23 kg/m. If this is out of the aforementioned range listed, please consider follow up with your Primary Care Provider.  If you are age 69 or younger, your body mass index should be between 19-25. Your Body mass index is 31.23 kg/m. If this is out of the aformentioned range listed, please consider follow up with your Primary Care Provider.   Thank you for choosing me and [facility].  Pricilla Riffle. Dagoberto Ligas., MD., Marval Regal

## 2019-10-24 NOTE — Progress Notes (Signed)
History of Present Illness: This is a 64 year old male referred by Carlena Hurl, PA-C for the evaluation of cirrhosis.  He has a history of hepatitis C genotype 1. He was evaluated and treated at [facility] in 2015 and 2016.  He underwent a 12-week course of Harvoni and the virus is cleared. CBC, LFTs, AFP in April 2021 were unremarkable.  He states he drinks 1 beer about every 3 days.  He has no gastrointestinal complaints.  No prior EGD to screen for varices.  Hepatitis B surface antibody positive in 2015.  Hepatitis A vaccination in 2015. Denies weight loss, abdominal pain, constipation, diarrhea, change in stool caliber, melena, hematochezia, nausea, vomiting, dysphagia, reflux symptoms, chest pain.   Abd [place] 03/12/2018 IMPRESSION: Cholelithiasis without sonographic evidence of acute cholecystitis. No focal liver lesion is identified. Mild inhomogeneity of liver echotexture is noted.  CT AP 11/01/2013 IMPRESSION:  Hepatic cirrhosis. No evidence of hepatic neoplasm or acute  findings.  Cholelithiasis. No radiographic evidence of cholecystitis.     No Known Allergies Outpatient Medications Prior to Visit  Medication Sig Dispense Refill  . amLODipine (NORVASC) 10 MG tablet Take 1 tablet (10 mg total) by mouth daily. 90 tablet 3  . atorvastatin (LIPITOR) 20 MG tablet Take 1 tablet (20 mg total) by mouth daily. 90 tablet 3  . carvedilol (COREG) 25 MG tablet Take 1 tablet (25 mg total) by mouth 2 (two) times daily with a meal. 180 tablet 3  . clopidogrel (PLAVIX) 75 MG tablet Take 1 tablet (75 mg total) by mouth daily. 90 tablet 3  . diphenhydrAMINE (SOMINEX) 25 MG tablet Take 25 mg by mouth at bedtime.     [name] icosapent Ethyl (VASCEPA) 1 g capsule Take 2 capsules (2 g total) by mouth 2 (two) times daily. 120 capsule 11  . irbesartan-hydrochlorothiazide (AVALIDE) 300-12.5 MG tablet TAKE 1 TABLET BY MOUTH ONCE DAILY *EMERGENCY REFILL* 90 tablet 3  . isosorbide mononitrate (IMDUR) 30  MG 24 hr tablet TAKE 1 TABLET(30 MG) BY MOUTH DAILY (Patient taking differently: Take 30 mg by mouth daily. ) 90 tablet 3  . nitroGLYCERIN (NITROSTAT) 0.4 MG SL tablet PLACE 1 TABLET UNDER THE TONGUE EVERY 5 MINURES FOR 3 DOSES AS NEEDED FOR CHEST PAIN 30 tablet 0   No facility-administered medications prior to visit.   Past Medical History:  Diagnosis Date  . CAD S/P percutaneous coronary angioplasty 06/2013   a. NSTEMI/Cath/PCI: LM nl, dLAD 20-40%, Diags small with ~95%; pLCx 20%, dLCx 99% * (DES PCI - 2.25x16 Promus Premier DES) & pOM1 80-90% (DES PCI: 2.5x20 Promus Premier DES), OM2 min irregs, LPL1 small, min irregs, RCA/RPDA min irregs, RPL nl, EF 55%.  . Chronic pain   . Cirrhosis ([place])    due to Hep C  . Former smoker    quit 2019  . Hepatitis B immune 2016  . Hepatitis C    s/p Harvoni therapy 2015  . History of NON-ST ELEVATION MI 06/2013   99% dLCx (DES PCI); 80-90% OM1 (DES PCI) --normal EF.  Normal wall motion.;   . HTN (hypertension)   . Hyperlipidemia   . Polysubstance abuse ([place])    history of cocaine, marijuana, tobacco abuse in the use  . Unstable angina ([place]) 07/11/2019  . Wears glasses    Past Surgical History:  Procedure Laterality Date  . COLONOSCOPY     denied, but Cologuard negative 12/2013  . CORONARY ANGIOPLASTY WITH STENT PLACEMENT  06/2013  dLCx 99% going into LPL1 (DES PCI: 2.25x16 Promus Premier DES), p OM1 80-90% (DES PCI - 2.5x20 Promus Premier DES)  . LEFT HEART CATH AND CORONARY ANGIOGRAPHY N/A 07/18/2019   Procedure: LEFT HEART CATH AND CORONARY ANGIOGRAPHY;  Surgeon: Leonie Man, MD;  Location: [facility];  Service: Cardiovascular;  Laterality: N/A;  . LEFT HEART CATHETERIZATION WITH CORONARY ANGIOGRAM N/A 06/23/2013   Procedure: LEFT HEART CATHETERIZATION WITH CORONARY ANGIOGRAM;  Surgeon: Leonie Man, MD;  Location: [facility] CATH LAB:: LM nl, d LAD 20-40%, small caliber Diags w/ severel ~95% lesions, p LCX 20%, dLCx 99% going into LPL1  (PCI- DES), p OM1 80-90% (PCI -DES), OM2 min irregs, LPL1 small, min irregs, RCA/RPDA min irregs, RPL nl, EF 55%.  [name] LIVER BIOPSY  2016  . TRANSTHORACIC ECHOCARDIOGRAM  06/2013   Normal LV size and moderate thickness/moderate LVH.  Normal EF 55-60%. No RWMA. Gr 1 DD. Mlid LA dilation   Social History   Socioeconomic History  . Marital status: Single    Spouse name: Not on file  . Number of children: Not on file  . Years of education: Not on file  . Highest education level: Not on file  Occupational History  . Not on file  Tobacco Use  . Smoking status: Former Smoker    Packs/day: 0.50    Years: 25.00    Pack years: 12.50    Quit date: 01/21/2016    Years since quitting: 3.7  . Smokeless tobacco: Never Used  Vaping Use  . Vaping Use: Never used  Substance and Sexual Activity  . Alcohol use: Yes    Alcohol/week: 2.0 standard drinks    Types: 2 Cans of beer per week  . Drug use: Not Currently    Comment: cocain and marijuana in the past  . Sexual activity: Yes  Other Topics Concern  . Not on file  Social History Narrative   He does get somes some exercise with walking 1- 1 1/2 miles /day, currently painting cars part time.   Lives with girlfriend and son   Social Determinants of Health   Financial Resource Strain:   . Difficulty of Paying Living Expenses:   Food Insecurity:   . Worried About Charity fundraiser in the Last Year:   . Arboriculturist in the Last Year:   Transportation Needs:   . Film/video editor (Medical):   [name] Lack of Transportation (Non-Medical):   Physical Activity:   . Days of Exercise per Week:   . Minutes of Exercise per Session:   Stress:   . Feeling of Stress :   Social Connections:   . Frequency of Communication with Friends and Family:   . Frequency of Social Gatherings with Friends and Family:   . Attends Religious Services:   . Active Member of Clubs or Organizations:   . Attends Archivist Meetings:   [name] Marital Status:      Family History  Problem Relation Age of Onset  . Hypertension Mother   . Heart disease Father   . Hypertension Father   . Hypertension Sister   . Stroke Sister   . Hypertension Sister   . Cancer Sister        pancreatic  . Heart disease Sister        CHF  . Hypertension Sister   . Diabetes Neg Hx   . Colon cancer Neg Hx   . Stomach cancer Neg Hx   .  Pancreatic cancer Neg Hx       Review of Systems: Pertinent positive and negative review of systems were noted in the above HPI section. All other review of systems were otherwise negative.   Physical Exam: General: Well developed, well nourished, no acute distress Head: Normocephalic and atraumatic Eyes:  sclerae anicteric, EOMI Ears: Normal auditory acuity Mouth: Not examined, mask on during Covid-19 pandemic Neck: Supple, no masses or thyromegaly Lungs: Clear throughout to auscultation Heart: Regular rate and rhythm; no murmurs, rubs or bruits Abdomen: Soft, non tender and non distended. No masses, hepatosplenomegaly or hernias noted. Normal Bowel sounds Rectal: Not done Musculoskeletal: Symmetrical with no gross deformities  Skin: No lesions on visible extremities Pulses:  Normal pulses noted Extremities: No clubbing, cyanosis, edema or deformities noted Neurological: Alert oriented x 4, grossly nonfocal Cervical Nodes:  No significant cervical adenopathy Inguinal Nodes: No significant inguinal adenopathy Psychological:  Alert and cooperative. Normal mood and affect   Assessment and Recommendations:  1.  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 Hepatitis C treated and cured in 2015, 2016. He plans to schedule an appointment with [facility] for ongoing follow-up of his liver disease.  Defer hepatic imaging, additional blood work to [facility].  Advised no more than 2g of Tylenol daily.  Advised to discontinue alcohol or at least not increase alcohol intake.  Advised to proceed with EGD to screen  for varices however he would like to discuss this with his hepatologist prior to proceeding.  2.  Cholelithiasis, asymptomatic.  3.  CRC screening, average risk. Cologuard negative 12/2014.  Patient is interested in continuing screening with Cologuard and will defer this to his PCP.   cc: Carlena Hurl, PA-C [address] [place],  [place] [address]

## 2019-11-02 ENCOUNTER — Other Ambulatory Visit: Payer: Self-pay | Admitting: Medical

## 2019-11-02 DIAGNOSIS — E785 Hyperlipidemia, unspecified: Secondary | ICD-10-CM

## 2019-11-02 DIAGNOSIS — I10 Essential (primary) hypertension: Secondary | ICD-10-CM

## 2019-11-02 NOTE — Telephone Encounter (Signed)
Pt. Requesting a refill on Ibesartan-hctz last apt was 08/11/19 and next apt 08/13/20 pt. Just had this refilled 08/12/19 #90 with 3 refills but to a different pharmacy.

## 2019-12-24 ENCOUNTER — Other Ambulatory Visit: Payer: Self-pay | Admitting: Family Medicine

## 2019-12-24 DIAGNOSIS — Z9861 Coronary angioplasty status: Secondary | ICD-10-CM

## 2019-12-24 DIAGNOSIS — I10 Essential (primary) hypertension: Secondary | ICD-10-CM

## 2019-12-24 DIAGNOSIS — E785 Hyperlipidemia, unspecified: Secondary | ICD-10-CM

## 2019-12-24 MED ORDER — IRBESARTAN-HYDROCHLOROTHIAZIDE 300-12.5 MG PO TABS: ORAL_TABLET | ORAL | 0 refills | Status: DC

## 2019-12-24 MED ORDER — CLOPIDOGREL BISULFATE 75 MG PO TABS: 75.0000 mg | ORAL_TABLET | Freq: Every day | ORAL | 0 refills | Status: DC

## 2019-12-24 MED ORDER — ISOSORBIDE MONONITRATE ER 30 MG PO TB24: ORAL_TABLET | ORAL | 0 refills | Status: DC

## 2019-12-24 MED ORDER — AMLODIPINE BESYLATE 10 MG PO TABS: 10.0000 mg | ORAL_TABLET | Freq: Every day | ORAL | 0 refills | Status: DC

## 2019-12-24 NOTE — Progress Notes (Signed)
He forgot to take his meds with him so had to cal  them in

## 2019-12-29 ENCOUNTER — Other Ambulatory Visit: Payer: Self-pay | Admitting: Medical

## 2019-12-29 DIAGNOSIS — E785 Hyperlipidemia, unspecified: Secondary | ICD-10-CM

## 2020-01-12 NOTE — Progress Notes (Deleted)
Cardiology Office Note   Date:  01/12/2020   ID:  Jay Fuller, DOB [birth year], MRN [id number]  PCP:  Carlena Hurl, PA-C Cardiologist:  [name], MD 08/14/2019 Electrphysiologist: None Rosaria Ferries, PA-C  07/11/2019  No chief complaint on file.   History of Present Illness: Jay Fuller is a 64 y.o. male with a history of NSTEMI 2015 w/ DES X 2 OM1/CFX, HTN, HLD, hep C s/p Harvoni, cirrhosis, remote cocaine/tob/THC  04/07 office visit, sx improved after cath, Imdur weaned off, stop ASA, continue Plavix but ok to hold for procedures, recheck lipids in September, consider Vascepa if not at goal  [name] presents for ***  COVID status: un-vaccinated, had/did not have COVID Past Medical History:  Diagnosis Date  . CAD S/P percutaneous coronary angioplasty 06/2013   a. NSTEMI/Cath/PCI: LM nl, dLAD 20-40%, Diags small with ~95%; pLCx 20%, dLCx 99% * (DES PCI - 2.25x16 Promus Premier DES) & pOM1 80-90% (DES PCI: 2.5x20 Promus Premier DES), OM2 min irregs, LPL1 small, min irregs, RCA/RPDA min irregs, RPL nl, EF 55%.  . Chronic pain   . Cirrhosis ([place])    due to Hep C  . Former smoker    quit 2019  . Hepatitis B immune 2016  . Hepatitis C    s/p Harvoni therapy 2015  . History of NON-ST ELEVATION MI 06/2013   99% dLCx (DES PCI); 80-90% OM1 (DES PCI) --normal EF.  Normal wall motion.;   . HTN (hypertension)   . Hyperlipidemia   . Polysubstance abuse ([place])    history of cocaine, marijuana, tobacco abuse in the use  . Unstable angina ([place]) 07/11/2019  . Wears glasses     Past Surgical History:  Procedure Laterality Date  . COLONOSCOPY     denied, but Cologuard negative 12/2013  . CORONARY ANGIOPLASTY WITH STENT PLACEMENT  06/2013    dLCx 99% going into LPL1 (DES PCI: 2.25x16 Promus Premier DES), p OM1 80-90% (DES PCI - 2.5x20 Promus Premier DES)  . LEFT HEART CATH AND CORONARY ANGIOGRAPHY N/A 07/18/2019   Procedure: LEFT HEART CATH AND  CORONARY ANGIOGRAPHY;  Surgeon: Leonie Man, MD;  Location: [facility];  Service: Cardiovascular;  Laterality: N/A;  . LEFT HEART CATHETERIZATION WITH CORONARY ANGIOGRAM N/A 06/23/2013   Procedure: LEFT HEART CATHETERIZATION WITH CORONARY ANGIOGRAM;  Surgeon: Leonie Man, MD;  Location: [facility] CATH LAB:: LM nl, d LAD 20-40%, small caliber Diags w/ severel ~95% lesions, p LCX 20%, dLCx 99% going into LPL1 (PCI- DES), p OM1 80-90% (PCI -DES), OM2 min irregs, LPL1 small, min irregs, RCA/RPDA min irregs, RPL nl, EF 55%.  [name] LIVER BIOPSY  2016  . TRANSTHORACIC ECHOCARDIOGRAM  06/2013   Normal LV size and moderate thickness/moderate LVH.  Normal EF 55-60%. No RWMA. Gr 1 DD. Mlid LA dilation    Current Outpatient Medications  Medication Sig Dispense Refill  . amLODipine (NORVASC) 10 MG tablet Take 1 tablet (10 mg total) by mouth daily. 7 tablet 0  . atorvastatin (LIPITOR) 20 MG tablet Take 1 tablet (20 mg total) by mouth daily. 90 tablet 3  . carvedilol (COREG) 25 MG tablet Take 1 tablet (25 mg total) by mouth 2 (two) times daily with a meal. 180 tablet 3  . clopidogrel (PLAVIX) 75 MG tablet Take 1 tablet (75 mg total) by mouth daily. 7 tablet 0  . diphenhydrAMINE (SOMINEX) 25 MG tablet Take 25 mg by mouth at bedtime.     [name] icosapent Ethyl (  VASCEPA) 1 g capsule Take 2 capsules (2 g total) by mouth 2 (two) times daily. 120 capsule 11  . irbesartan-hydrochlorothiazide (AVALIDE) 300-12.5 MG tablet TAKE 1 TABLET BY MOUTH ONCE DAILY *EMERGENCY REFILL* 7 tablet 0  . isosorbide mononitrate (IMDUR) 30 MG 24 hr tablet TAKE 1 TABLET BY MOUTH EVERY DAY 30 tablet 0  . nitroGLYCERIN (NITROSTAT) 0.4 MG SL tablet PLACE 1 TABLET UNDER THE TONGUE EVERY 5 MINURES FOR 3 DOSES AS NEEDED FOR CHEST PAIN 30 tablet 0   No current facility-administered medications for this visit.    Allergies:   Patient has no known allergies.    Social History:  The patient  reports that he quit smoking about 3 years ago. He  has a 12.50 pack-year smoking history. He has never used smokeless tobacco. He reports current alcohol use of about 2.0 standard drinks of alcohol per week. He reports previous drug use.   Family History:  The patient's family history includes Cancer in his sister; Heart disease in his father and sister; Hypertension in his father, mother, sister, sister, and sister; Stroke in his sister.  He indicated that his mother is deceased. He indicated that his father is deceased. He indicated that two of his four sisters are alive. He indicated that both of his daughters are alive. He indicated that his son is alive. He indicated that the status of his neg hx is unknown.    ROS:  Please see the history of present illness. All other systems are reviewed and negative.    PHYSICAL EXAM: VS:  There were no vitals taken for this visit. , BMI There is no height or weight on file to calculate BMI. GEN: Well nourished, well developed, male in no acute distress HEENT: normal for age  Neck: no JVD, no carotid bruit, no masses Cardiac: RRR; no murmur, no rubs, or gallops Respiratory:  clear to auscultation bilaterally, normal work of breathing GI: soft, nontender, nondistended, + BS MS: no deformity or atrophy; no edema; distal pulses are 2+ in all 4 extremities  Skin: warm and dry, no rash Neuro:  Strength and sensation are intact Psych: euthymic mood, full affect   EKG:  EKG {ACTION; IS/IS INO:[id number]} ordered today. The ekg ordered today demonstrates ***  ECHO: 06/24/2013 - Left ventricle: The cavity size was normal. Wall thickness  was increased in a pattern of moderate LVH. Systolic  function was normal. The estimated ejection fraction was  in the range of 55% to 60%. Wall motion was normal; there  were no regional wall motion abnormalities. Doppler  parameters are consistent with abnormal left ventricular  relaxation (grade 1 diastolic dysfunction).  - Left atrium: The atrium was  mildly dilated.    CATH: 07/18/2019  Previously placed 1st Mrg stent (Promus Premier DES 2.5 mm x 20 mm - 2.75 mm) is widely patent.  Previously placed dLCx/3rd Mrg-1 Stent (Promus Premier DES 2.25 mm x 54mm - 2.5 mm) is widely patent.  DLCx-3rd Mrg-2 lesion is 30% stenosed.  The left ventricular systolic function is The left ventricular ejection fraction is 55-65% by visual estimate.  LV end diastolic pressure is normal.   SUMMARY  Two-vessel CAD with widely patent stents in OM1 and distal LCx-OM 3.  Otherwise minimal disease.  No obvious culprit lesion.  Normal EF and EDP.   RECOMMENDATIONS  Okay to discharge home after bedrest  Consider nonanginal etiology for chest pain-less likely microvascular disease.   Recent Labs: 08/11/2019: ALT 24; BUN 14; Creatinine, Ser  1.10; Hemoglobin 13.7; Platelets 193; Potassium 3.8; Sodium 139  CBC    Component Value Date/Time   WBC 6.3 08/11/2019 1006   WBC 5.7 01/20/2017 0933   RBC 4.84 08/11/2019 1006   RBC 5.21 01/20/2017 0933   HGB 13.7 08/11/2019 1006   HCT 40.9 08/11/2019 1006   PLT 193 08/11/2019 1006   MCV 85 08/11/2019 1006   MCH 28.3 08/11/2019 1006   MCH 27.8 01/20/2017 0933   MCHC 33.5 08/11/2019 1006   MCHC 33.4 01/20/2017 0933   RDW 14.5 08/11/2019 1006   LYMPHSABS 2.7 08/11/2019 1006   EOSABS 0.2 08/11/2019 1006   BASOSABS 0.0 08/11/2019 1006   CMP Latest Ref Rng & Units 08/11/2019 07/15/2019 02/24/2018  Glucose 65 - 99 mg/dL 119(H) 126(H) 133(H)  BUN 8 - 27 mg/dL 14 10 12   Creatinine 0.76 - 1.27 mg/dL 1.10 1.02 1.02  Sodium 134 - 144 mmol/L 139 136 142  Potassium 3.5 - 5.2 mmol/L 3.8 3.9 4.0  Chloride 96 - 106 mmol/L 102 98 104  CO2 20 - 29 mmol/L 22 22 25   Calcium 8.6 - 10.2 mg/dL 9.8 9.3 9.6  Total Protein 6.0 - 8.5 g/dL 7.1 6.8 7.0  Total Bilirubin 0.0 - 1.2 mg/dL 0.3 0.3 0.3  Alkaline Phos 39 - 117 IU/L 83 92 94  AST 0 - 40 IU/L 25 28 20   ALT 0 - 44 IU/L 24 20 14      Lipid Panel Lab Results    Component Value Date   CHOL 176 08/11/2019   HDL 43 08/11/2019   LDLCALC 65 08/11/2019   TRIG 442 (H) 08/11/2019   CHOLHDL 4.1 08/11/2019      Wt Readings from Last 3 Encounters:  10/24/19 199 lb 6 oz (90.4 kg)  08/11/19 194 lb 9.6 oz (88.3 kg)  08/10/19 194 lb 9.6 oz (88.3 kg)     Other studies Reviewed: Additional studies/ records that were reviewed today include: Office notes, hospital records and testing.  ASSESSMENT AND PLAN:  1.  ***   Current medicines are reviewed at length with the patient today.  The patient {ACTIONS; HAS/DOES NOT HAVE:19233} concerns regarding medicines.  The following changes have been made:  {PLAN; NO CHANGE:13088:s}  Labs/ tests ordered today include:  No orders of the defined types were placed in this encounter.    Disposition:   FU with [name], MD  Signed, Rosaria Ferries, PA-C  01/12/2020 8:24 PM    [facility] Phone: [phone]; Fax: [phone]

## 2020-01-13 ENCOUNTER — Ambulatory Visit: Payer: Medicare Other | Admitting: Physician Assistant

## 2020-01-26 ENCOUNTER — Other Ambulatory Visit: Payer: Self-pay | Admitting: Medical

## 2020-01-26 DIAGNOSIS — E785 Hyperlipidemia, unspecified: Secondary | ICD-10-CM

## 2020-02-24 ENCOUNTER — Other Ambulatory Visit: Payer: Self-pay | Admitting: Medical

## 2020-02-24 DIAGNOSIS — E785 Hyperlipidemia, unspecified: Secondary | ICD-10-CM

## 2020-03-22 ENCOUNTER — Other Ambulatory Visit: Payer: Self-pay | Admitting: Medical

## 2020-03-22 DIAGNOSIS — E785 Hyperlipidemia, unspecified: Secondary | ICD-10-CM

## 2020-04-04 ENCOUNTER — Telehealth: Payer: Medicare Other | Admitting: Cardiology

## 2020-04-04 DIAGNOSIS — I10 Essential (primary) hypertension: Secondary | ICD-10-CM

## 2020-04-04 DIAGNOSIS — E785 Hyperlipidemia, unspecified: Secondary | ICD-10-CM

## 2020-04-04 DIAGNOSIS — I2510 Atherosclerotic heart disease of native coronary artery without angina pectoris: Secondary | ICD-10-CM

## 2020-04-18 ENCOUNTER — Other Ambulatory Visit: Payer: Self-pay | Admitting: Medical

## 2020-04-18 DIAGNOSIS — E785 Hyperlipidemia, unspecified: Secondary | ICD-10-CM

## 2020-04-30 ENCOUNTER — Other Ambulatory Visit: Payer: Self-pay | Admitting: Family Medicine

## 2020-04-30 ENCOUNTER — Other Ambulatory Visit: Payer: Self-pay | Admitting: Medical

## 2020-04-30 DIAGNOSIS — E785 Hyperlipidemia, unspecified: Secondary | ICD-10-CM

## 2020-05-22 ENCOUNTER — Other Ambulatory Visit: Payer: Self-pay | Admitting: Medical

## 2020-05-22 DIAGNOSIS — E785 Hyperlipidemia, unspecified: Secondary | ICD-10-CM

## 2020-06-18 ENCOUNTER — Other Ambulatory Visit: Payer: Self-pay | Admitting: Medical

## 2020-06-18 DIAGNOSIS — E785 Hyperlipidemia, unspecified: Secondary | ICD-10-CM

## 2020-06-26 ENCOUNTER — Other Ambulatory Visit: Payer: Self-pay | Admitting: Medical

## 2020-06-26 DIAGNOSIS — I10 Essential (primary) hypertension: Secondary | ICD-10-CM

## 2020-06-26 DIAGNOSIS — E785 Hyperlipidemia, unspecified: Secondary | ICD-10-CM

## 2020-07-25 ENCOUNTER — Other Ambulatory Visit: Payer: Self-pay | Admitting: Medical

## 2020-07-25 DIAGNOSIS — I10 Essential (primary) hypertension: Secondary | ICD-10-CM

## 2020-07-25 DIAGNOSIS — E785 Hyperlipidemia, unspecified: Secondary | ICD-10-CM

## 2020-07-26 ENCOUNTER — Other Ambulatory Visit: Payer: Self-pay | Admitting: Medical

## 2020-08-13 ENCOUNTER — Encounter: Payer: Self-pay | Admitting: Medical

## 2020-08-13 ENCOUNTER — Ambulatory Visit (INDEPENDENT_AMBULATORY_CARE_PROVIDER_SITE_OTHER): Payer: Medicare Other | Admitting: Medical

## 2020-08-13 ENCOUNTER — Other Ambulatory Visit: Payer: Self-pay

## 2020-08-13 VITALS — BP 134/82 | HR 61 | Ht 67.0 in | Wt 193.6 lb

## 2020-08-13 DIAGNOSIS — R942 Abnormal results of pulmonary function studies: Secondary | ICD-10-CM

## 2020-08-13 DIAGNOSIS — K7460 Unspecified cirrhosis of liver: Secondary | ICD-10-CM

## 2020-08-13 DIAGNOSIS — I252 Old myocardial infarction: Secondary | ICD-10-CM

## 2020-08-13 DIAGNOSIS — M25559 Pain in unspecified hip: Secondary | ICD-10-CM

## 2020-08-13 DIAGNOSIS — Z972 Presence of dental prosthetic device (complete) (partial): Secondary | ICD-10-CM | POA: Diagnosis not present

## 2020-08-13 DIAGNOSIS — Z0000 Encounter for general adult medical examination without abnormal findings: Secondary | ICD-10-CM

## 2020-08-13 DIAGNOSIS — Z23 Encounter for immunization: Secondary | ICD-10-CM

## 2020-08-13 DIAGNOSIS — E785 Hyperlipidemia, unspecified: Secondary | ICD-10-CM

## 2020-08-13 DIAGNOSIS — K08109 Complete loss of teeth, unspecified cause, unspecified class: Secondary | ICD-10-CM

## 2020-08-13 DIAGNOSIS — Z9861 Coronary angioplasty status: Secondary | ICD-10-CM

## 2020-08-13 DIAGNOSIS — Z7185 Encounter for immunization safety counseling: Secondary | ICD-10-CM

## 2020-08-13 DIAGNOSIS — Z125 Encounter for screening for malignant neoplasm of prostate: Secondary | ICD-10-CM

## 2020-08-13 DIAGNOSIS — I10 Essential (primary) hypertension: Secondary | ICD-10-CM

## 2020-08-13 DIAGNOSIS — Z8619 Personal history of other infectious and parasitic diseases: Secondary | ICD-10-CM

## 2020-08-13 DIAGNOSIS — R7301 Impaired fasting glucose: Secondary | ICD-10-CM

## 2020-08-13 DIAGNOSIS — Z136 Encounter for screening for cardiovascular disorders: Secondary | ICD-10-CM

## 2020-08-13 DIAGNOSIS — G8929 Other chronic pain: Secondary | ICD-10-CM

## 2020-08-13 DIAGNOSIS — R0989 Other specified symptoms and signs involving the circulatory and respiratory systems: Secondary | ICD-10-CM

## 2020-08-13 DIAGNOSIS — Z1211 Encounter for screening for malignant neoplasm of colon: Secondary | ICD-10-CM | POA: Diagnosis not present

## 2020-08-13 DIAGNOSIS — I2510 Atherosclerotic heart disease of native coronary artery without angina pectoris: Secondary | ICD-10-CM

## 2020-08-13 DIAGNOSIS — K219 Gastro-esophageal reflux disease without esophagitis: Secondary | ICD-10-CM

## 2020-08-13 DIAGNOSIS — Z87891 Personal history of nicotine dependence: Secondary | ICD-10-CM

## 2020-08-13 NOTE — Progress Notes (Signed)
[facility]

## 2020-08-13 NOTE — Patient Instructions (Signed)
This visit was a preventative care visit, also known as wellness visit or routine physical.   Topics typically include healthy lifestyle, diet, exercise, preventative care, vaccinations, sick and well care, proper use of emergency dept and after hours care, as well as other concerns.     Recommendations: Continue to return yearly for your annual wellness and preventative care visits.  This gives [place] a chance to discuss healthy lifestyle, exercise, vaccinations, review your chart record, and perform screenings where appropriate.  I recommend you see your eye doctor yearly for routine vision care.  I recommend you see your dentist yearly for routine dental care including hygiene visits twice yearly.   Vaccination recommendations were reviewed Immunization History  Administered Date(s) Administered  . Hepatitis A, Adult 07/22/2013, 07/25/2015  . Influenza,inj,Quad PF,6+ Mos 06/24/2013, 01/22/2015, 01/20/2017, 02/24/2018  . PFIZER(Purple Top)SARS-COV-2 Vaccination 07/19/2019, 08/09/2019  . Pneumococcal Polysaccharide-23 06/24/2013  . Tdap 06/14/2012    I recommend you have and updated Pneumococcal 23 vaccine. Consider getting this next year in 2023.  I recommend a yearly flu shot in the fall  Shingles vaccine:  I recommend you have a shingles vaccine to help prevent shingles or herpes zoster outbreak.   Please call your insurer to inquire about coverage for the Shingrix vaccine given in 2 doses.   Some insurers cover this vaccine after age 71, some cover this after age 64.  If your insurer covers this, then call to schedule appointment to have this vaccine here.  We updated your Prevnar 13 today. Counseled on the pneumococcal vaccine.  Vaccine information sheet given.  Pneumococcal vaccine Prevnar 13 given after consent obtained.    Screening for cancer: Colon cancer screening: I recommend both endoscopy and colonoscopy.   You were not to enthused about this today. Please consider.    Let me know if willing.  If not, we will refer for Cologuard colon cancer screening.  Please call your insurance company to check coverage for colon cancer screening.  Options may include Cologuard stool test or Colonoscopy.  You should also inquire about which facility the colonoscopy could be performed, and coverage for diagnostic vs screening colonoscopy as coverage may vary.  If you have significant family history of colon cancer or blood in the stool, then you should only do the colonoscopy, not the Cologuard test.   We discussed PSA, prostate exam, and prostate cancer screening risks/benefits.     Skin cancer screening: Check your skin regularly for new changes, growing lesions, or other lesions of concern Come in for evaluation if you have skin lesions of concern.  Lung cancer screening: If you have a greater than 20 pack year history of tobacco use, then you may qualify for lung cancer screening with a chest CT scan.   Please call your insurance company to inquire about coverage for this test.  We currently don't have screenings for other cancers besides breast, cervical, colon, and lung cancers.  If you have a strong family history of cancer or have other cancer screening concerns, please let me know.    Bone health: Get at least 150 minutes of aerobic exercise weekly Get weight bearing exercise at least once weekly Bone density test:   A bone density test is an imaging test that uses a type of X-ray to measure the amount of calcium and other minerals in your bones.  The test may be used to diagnose or screen you for a condition that causes weak or thin bones (osteoporosis), predict your risk  for a broken bone (fracture), or determine how well your osteoporosis treatment is working. The bone density test is recommended for females 76 and older, or females or males <67 if certain risk factors such as thyroid disease, long term use of steroids such as for asthma or rheumatological  issues, vitamin D deficiency, estrogen deficiency, family history of osteoporosis, self or family history of fragility fracture in first degree relative.    Heart health: Get at least 150 minutes of aerobic exercise weekly Limit alcohol It is important to maintain a healthy blood pressure and healthy cholesterol numbers  Heart disease screening: Screening for heart disease includes screening for blood pressure, fasting lipids, glucose/diabetes screening, BMI height to weight ratio, reviewed of smoking status, physical activity, and diet.    Goals include blood pressure 120/80 or less, maintaining a healthy lipid/cholesterol profile, preventing diabetes or keeping diabetes numbers under good control, not smoking or using tobacco products, exercising most days per week or at least 150 minutes per week of exercise, and eating healthy variety of fruits and vegetables, healthy oils, and avoiding unhealthy food choices like fried food, fast food, high sugar and high cholesterol foods.    Other tests may possibly include EKG test, CT coronary calcium score, echocardiogram, exercise treadmill stress test.   Follow up with cardiology as usual    Medical care options: I recommend you continue to seek care here first for routine car and to evaluate s, acute visits, and physicals.  Urgent care should be used for after hours and weekends for significant issues that cannot wait till the next day.  The emergency department should be used for significant potentially life-threatening emergencies.  The emergency department is expensive, can often have long wait times for less significant concerns, so try to utilize primary care, urgent care, or telemedicine when possible to avoid unnecessary trips to the emergency department.  Virtual visits and telemedicine have been introduced since the pandemic started in 2020, and can be convenient ways to receive medical care.  We offer virtual appointments as well to assist  you in a variety of options to seek medical care.    Separate significant issues discussed: High blood pressure-continue current medication, follow-up with cardiology as planned  History of heart attack-continue current medications, follow-up with cardiology as planned or scheduled  History of hepatitis and cirrhosis of the liver-labs today, I recommend you avoid alcohol completely, limit Tylenol and over-the-counter pain medicine use, I recommend referral back to hepatitis clinic for updated surveillance.  I recommend endoscopy and colonoscopy for both screening and evaluation for esophageal varices  Obesity-I recommend efforts to lose weight through healthy diet and exercise  Bilateral hip pain-I recommend you go for baseline x-rays  Please go to [facility] for your hip xrays.   Their hours are 8am - 4:30 pm Monday - Friday.  Take your insurance card with you.  [facility] [phone]  [facility] [address], [place], [place] [address]  [address], [place] [address]   Impaired glucose, diabetes-labs today  Abnormal PFT breathing test/screen for COPD-2019 breathing test suggested COPD.  You report no symptoms today.  You are a former smoker.  If you are having any trouble breathing or start to have difficulty breathing with exercise and let me or cardiology know  Decreased pulses in the legs-I recommend an updated ABI blood flow screen for the legs   Advanced Directives: I recommend you consider completing a [place] and Living  Will.   These documents respect your wishes and help alleviate burdens on your loved ones if you were to become terminally ill or be in a position to need those documents enforced.    You can complete Advanced Directives yourself, have them notarized, then have copies made for our office, for you, and for anybody you feel should have them in safe keeping.  Or, you can have an attorney prepare these  documents.   If you haven't updated your Last Will and Testament in a while, it may be worthwhile having an attorney prepare these documents together and save on some costs.   Sometimes credit unions offer affordable packages with attorneys to prepare these documents as a bundle.  Sometimes these documents are offered through employer legal subscription plans as well.

## 2020-08-13 NOTE — Progress Notes (Signed)
Subjective:    Jay Fuller is a 65 y.o. male who presents for Preventative Services visit and chronic medical problems/med check visit.    Primary Care Provider Cobi Delph, Camelia Eng, PA-C here for primary care  Current Health Care Team:  Dentist, none, has upper and lower dentures  Eye doctor, [facility]  Dr. [name], cardiology  Cindie Crumbly, PA and Dr. Bjorn Pippin with [facility]  No Gastroenterology , refused colonoscopy and endoscopy prior   Medical Services you may have received from other than [facility] providers in the past year (date may be approximate) none  Exercise Current exercise habits: Home exercise routine includes walking 30 minutes 2 days a week. .   Nutrition/Diet Current diet: well balanced  Depression Screen Depression screen [facility] 2/9 08/13/2020  Decreased Interest 0  Down, Depressed, Hopeless 0  PHQ - 2 Score 0    Activities of Daily Living Screen/Functional Status Survey Is the patient deaf or have difficulty hearing?: No Does the patient have difficulty seeing, even when wearing glasses/contacts?: No Does the patient have difficulty concentrating, remembering, or making decisions?: No Does the patient have difficulty walking or climbing stairs?: No Does the patient have difficulty dressing or bathing?: No Does the patient have difficulty doing errands alone such as visiting a doctor's office or shopping?: No  Can patient draw a clock face showing 3:15 oclock, yes  Fall Risk Screen Fall Risk  08/13/2020 08/11/2019 02/24/2018 01/20/2017  Falls in the past year? 0 0 No No  Number falls in past yr: 0 - - -  Injury with Fall? 0 - - -  Risk for fall due to : No Fall Risks - - -  Follow up Falls evaluation completed - - -    Gait Assessment: Normal gait observed  - yes  Advanced directives Does patient have a Grand? No Does patient have a Living Will? No  Past Medical History:   Diagnosis Date  . CAD S/P percutaneous coronary angioplasty 06/2013   a. NSTEMI/Cath/PCI: LM nl, dLAD 20-40%, Diags small with ~95%; pLCx 20%, dLCx 99% * (DES PCI - 2.25x16 Promus Premier DES) & pOM1 80-90% (DES PCI: 2.5x20 Promus Premier DES), OM2 min irregs, LPL1 small, min irregs, RCA/RPDA min irregs, RPL nl, EF 55%.  . Chronic pain   . 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 Former smoker    quit 2019  . Hepatitis B immune 2016  . Hepatitis C    s/p Harvoni therapy 2015  . History of NON-ST ELEVATION MI 06/2013   99% dLCx (DES PCI); 80-90% OM1 (DES PCI) --normal EF.  Normal wall motion.;   . HTN (hypertension)   . Hyperlipidemia   . Polysubstance abuse ([place])    history of cocaine, marijuana, tobacco abuse in the use  . Unstable angina ([place]) 07/11/2019  . Wears glasses     Past Surgical History:  Procedure Laterality Date  . COLONOSCOPY     denied, but Cologuard negative 12/2013  . CORONARY ANGIOPLASTY WITH STENT PLACEMENT  06/2013    dLCx 99% going into LPL1 (DES PCI: 2.25x16 Promus Premier DES), p OM1 80-90% (DES PCI - 2.5x20 Promus Premier DES)  . LEFT HEART CATH AND CORONARY ANGIOGRAPHY N/A 07/18/2019   Procedure: LEFT HEART CATH AND CORONARY ANGIOGRAPHY;  Surgeon: Leonie Man, MD;  Location: [place] [facility];  Service: Cardiovascular;  Laterality: N/A;  . LEFT HEART CATHETERIZATION WITH CORONARY ANGIOGRAM N/A  06/23/2013   Procedure: LEFT HEART CATHETERIZATION WITH CORONARY ANGIOGRAM;  Surgeon: Leonie Man, MD;  Location: [facility] CATH LAB:: LM nl, d LAD 20-40%, small caliber Diags w/ severel ~95% lesions, p LCX 20%, dLCx 99% going into LPL1 (PCI- DES), p OM1 80-90% (PCI -DES), OM2 min irregs, LPL1 small, min irregs, RCA/RPDA min irregs, RPL nl, EF 55%.  [name] LIVER BIOPSY  2016  . TRANSTHORACIC ECHOCARDIOGRAM  06/2013   Normal LV size and moderate thickness/moderate LVH.  Normal EF 55-60%. No RWMA. Gr 1 DD. Mlid LA dilation    Social History   Socioeconomic History  . Marital  status: Single    Spouse name: Not on file  . Number of children: Not on file  . Years of education: Not on file  . Highest education level: Not on file  Occupational History  . Not on file  Tobacco Use  . Smoking status: Former Smoker    Packs/day: 0.50    Years: 25.00    Pack years: 12.50    Quit date: 01/21/2016    Years since quitting: 4.5  . Smokeless tobacco: Never Used  Vaping Use  . Vaping Use: Never used  Substance and Sexual Activity  . Alcohol use: Yes    Alcohol/week: 2.0 standard drinks    Types: 2 Cans of beer per week  . Drug use: Not Currently    Comment: cocain and marijuana in the past  . Sexual activity: Yes  Other Topics Concern  . Not on file  Social History Narrative   He does get somes some exercise with walking 1- 1 1/2 miles /day, currently painting cars part time.   Lives with girlfriend and son   Social Determinants of Health   Financial Resource Strain: Not on file  Food Insecurity: Not on file  Transportation Needs: Not on file  Physical Activity: Not on file  Stress: Not on file  Social Connections: Not on file  Intimate Partner Violence: Not on file    Family History  Problem Relation Age of Onset  . Hypertension Mother   . Heart disease Father   . Hypertension Father   . Hypertension Sister   . Stroke Sister   . Hypertension Sister   . Cancer Sister        pancreatic  . Heart disease Sister        CHF  . Hypertension Sister   . Diabetes Neg Hx   . Colon cancer Neg Hx   . Stomach cancer Neg Hx   . Pancreatic cancer Neg Hx      Current Outpatient Medications:  .  amLODipine (NORVASC) 10 MG tablet, Take 1 tablet (10 mg total) by mouth daily., Disp: 7 tablet, Rfl: 0 .  atorvastatin (LIPITOR) 20 MG tablet, TAKE 1 TABLET BY MOUTH EVERY DAY, Disp: 30 tablet, Rfl: 2 .  carvedilol (COREG) 25 MG tablet, TAKE ONE (1) TABLET BY MOUTH TWICE DAILY WITH A MEAL, Disp: 60 tablet, Rfl: 1 .  clopidogrel (PLAVIX) 75 MG tablet, Take 1 tablet  (75 mg total) by mouth daily., Disp: 7 tablet, Rfl: 0 .  diphenhydrAMINE (SOMINEX) 25 MG tablet, Take 25 mg by mouth at bedtime., Disp: , Rfl:  .  irbesartan-hydrochlorothiazide (AVALIDE) 300-12.5 MG tablet, TAKE 1 TABLET BY MOUTH ONCE DAILY, Disp: 30 tablet, Rfl: 0 .  isosorbide mononitrate (IMDUR) 30 MG 24 hr tablet, TAKE 1 TABLET BY MOUTH ONCE DAILY, Disp: 30 tablet, Rfl: 0 .  nitroGLYCERIN (NITROSTAT) 0.4 MG SL tablet,  PLACE 1 TABLET UNDER THE TONGUE EVERY 5 MINURES FOR 3 DOSES AS NEEDED FOR CHEST PAIN, Disp: 30 tablet, Rfl: 0 .  VASCEPA 1 g capsule, TAKE 2 CAPSULES BY MOUTH TWICE DAILY, Disp: 120 capsule, Rfl: 0  No Known Allergies  History reviewed: allergies, current medications, past family history, past medical history, past social history, past surgical history and problem list  Chronic issues discussed: Hypertension - checks home BPs - 135/90, 130s/80s regularly.  Sees cardiology  Hyperlipidemia-compliant with Lipitor  History of MI-compliant with medication including Plavix, sees cardiology  History of cirrhosis of liver-has not been hepatitis clinic in a while.  He does drink some alcohol.  Typically drinks about once per week.    Acute issues discussed: Recently cant stand for long periods, starts have pain in hips.  No recent fall, no injured, no trauma.    Objective:      Biometrics BP 134/82   Pulse 61   Ht 5\' 7"  (1.702 m)   Wt 193 lb 9.6 oz (87.8 kg)   SpO2 96%   BMI 30.32 kg/m   BP Readings from Last 3 Encounters:  08/13/20 134/82  10/24/19 126/78  08/11/19 132/70    Cognitive Testing  Alert? Yes  Normal Appearance?Yes  Oriented to person? Yes  Place? Yes   Time? Yes  Recall of three objects?  Yes  Can perform simple calculations? Yes  Displays appropriate judgment?Yes  Can read the correct time from a watch face?Yes  General appearance: alert, no distress, WD/WN, African American male  Nutritional Status: Inadequate calore intake? no Loss  of muscle mass? no Loss of fat beneath skin? no Localized or general edema? no Diminished functional status? no  Other pertinent exam: HEENT: normocephalic, sclerae anicteric, TMs pearly, nares patent, no discharge or erythema, pharynx normal Oral cavity: MMM, no lesions Neck: supple, no lymphadenopathy, no thyromegaly, no masses Heart: RRR, normal S1, S2, no murmurs Lungs: CTA bilaterally, no wheezes, rhonchi, or rales Abdomen: +bs, soft, non tender, non distended, no masses, no hepatomegaly, no splenomegaly Musculoskeletal: Mild pain with hips with range of motion relatively good range of motion bilaterally, otherwise MSK nontender, no swelling, no obvious deformity Extremities: no edema, no cyanosis, no clubbing Pulses: 2+ symmetric, upper and 1+ bilateral pedal pulses, normal cap refill Neurological: alert, oriented x 3, CN2-12 intact, strength normal upper extremities and lower extremities, sensation normal throughout, DTRs 2+ throughout, no cerebellar signs, gait normal Psychiatric: normal affect, behavior normal, pleasant  GU, normal male, slight darker coloration of left side of the penis that he notes unchanged from birth, uncertain, no mass no lymphadenopathy no hernia DRE: Prostate mildly enlarged, no nodules, anus normal tone   Assessment:   Encounter Diagnoses  Name Primary?  . Encounter for health maintenance examination in adult Yes  . Medicare annual wellness visit, subsequent   . Full dentures   . Vaccine counseling   . Screen for colon cancer   . Impaired fasting blood sugar   . Hyperlipidemia with target low density lipoprotein (LDL) cholesterol less than 70 mg/dL   . History of MI (myocardial infarction)   . History of hepatitis C   . Gastroesophageal reflux disease, unspecified whether esophagitis present   . Former smoker   . Essential hypertension   . Decreased pulses in feet   . Cirrhosis of liver without ascites, unspecified hepatic cirrhosis type ([place])    . CAD S/P percutaneous coronary angioplasty   . Abnormal PFT   . Hip pain, chronic,  unspecified laterality   . Need for pneumococcal vaccination   . Screening for prostate cancer   . Encounter for screening for vascular disease      Plan:   A preventative services visit was completed today.  During the course of the visit today, we discussed and counseled about appropriate screening and preventive services.  A health risk assessment was established today that included a review of current medications, allergies, social history, family history, medical and preventative health history, biometrics, and preventative screenings to identify potential safety concerns or impairments.  This visit was a preventative care visit, also known as wellness visit or routine physical.   Topics typically include healthy lifestyle, diet, exercise, preventative care, vaccinations, sick and well care, proper use of emergency dept and after hours care, as well as other concerns.     Recommendations: Continue to return yearly for your annual wellness and preventative care visits.  This gives [place] a chance to discuss healthy lifestyle, exercise, vaccinations, review your chart record, and perform screenings where appropriate.  I recommend you see your eye doctor yearly for routine vision care.  I recommend you see your dentist yearly for routine dental care including hygiene visits twice yearly.   Vaccination recommendations were reviewed Immunization History  Administered Date(s) Administered  . Hepatitis A, Adult 07/22/2013, 07/25/2015  . Influenza,inj,Quad PF,6+ Mos 06/24/2013, 01/22/2015, 01/20/2017, 02/24/2018  . PFIZER(Purple Top)SARS-COV-2 Vaccination 07/19/2019, 08/09/2019  . Pneumococcal Polysaccharide-23 06/24/2013  . Tdap 06/14/2012    I recommend you have and updated Pneumococcal 23 vaccine. Consider getting this next year in 2023.  I recommend a yearly flu shot in the fall  Shingles vaccine:   I recommend you have a shingles vaccine to help prevent shingles or herpes zoster outbreak.   Please call your insurer to inquire about coverage for the Shingrix vaccine given in 2 doses.   Some insurers cover this vaccine after age 40, some cover this after age 58.  If your insurer covers this, then call to schedule appointment to have this vaccine here.  We updated your Prevnar 13 today. Counseled on the pneumococcal vaccine.  Vaccine information sheet given.  Pneumococcal vaccine Prevnar 13 given after consent obtained.    Screening for cancer: Colon cancer screening: I recommend both endoscopy and colonoscopy.   You were not to enthused about this today. Please consider.   Let me know if willing.  If not, we will refer for Cologuard colon cancer screening.  Please call your insurance company to check coverage for colon cancer screening.  Options may include Cologuard stool test or Colonoscopy.  You should also inquire about which facility the colonoscopy could be performed, and coverage for diagnostic vs screening colonoscopy as coverage may vary.  If you have significant family history of colon cancer or blood in the stool, then you should only do the colonoscopy, not the Cologuard test.   We discussed PSA, prostate exam, and prostate cancer screening risks/benefits.     Skin cancer screening: Check your skin regularly for new changes, growing lesions, or other lesions of concern Come in for evaluation if you have skin lesions of concern.  Lung cancer screening: If you have a greater than 20 pack year history of tobacco use, then you may qualify for lung cancer screening with a chest CT scan.   Please call your insurance company to inquire about coverage for this test.  We currently don't have screenings for other cancers besides breast, cervical, colon, and lung cancers.  If  you have a strong family history of cancer or have other cancer screening concerns, please let me know.    Bone  health: Get at least 150 minutes of aerobic exercise weekly Get weight bearing exercise at least once weekly Bone density test:   A bone density test is an imaging test that uses a type of X-ray to measure the amount of calcium and other minerals in your bones.  The test may be used to diagnose or screen you for a condition that causes weak or thin bones (osteoporosis), predict your risk for a broken bone (fracture), or determine how well your osteoporosis treatment is working. The bone density test is recommended for females 1 and older, or females or males <61 if certain risk factors such as thyroid disease, long term use of steroids such as for asthma or rheumatological issues, vitamin D deficiency, estrogen deficiency, family history of osteoporosis, self or family history of fragility fracture in first degree relative.    Heart health: Get at least 150 minutes of aerobic exercise weekly Limit alcohol It is important to maintain a healthy blood pressure and healthy cholesterol numbers  Heart disease screening: Screening for heart disease includes screening for blood pressure, fasting lipids, glucose/diabetes screening, BMI height to weight ratio, reviewed of smoking status, physical activity, and diet.    Goals include blood pressure 120/80 or less, maintaining a healthy lipid/cholesterol profile, preventing diabetes or keeping diabetes numbers under good control, not smoking or using tobacco products, exercising most days per week or at least 150 minutes per week of exercise, and eating healthy variety of fruits and vegetables, healthy oils, and avoiding unhealthy food choices like fried food, fast food, high sugar and high cholesterol foods.    Other tests may possibly include EKG test, CT coronary calcium score, echocardiogram, exercise treadmill stress test.   Follow up with cardiology as usual    Medical care options: I recommend you continue to seek care here first for routine  car and to evaluate s, acute visits, and physicals.  Urgent care should be used for after hours and weekends for significant issues that cannot wait till the next day.  The emergency department should be used for significant potentially life-threatening emergencies.  The emergency department is expensive, can often have long wait times for less significant concerns, so try to utilize primary care, urgent care, or telemedicine when possible to avoid unnecessary trips to the emergency department.  Virtual visits and telemedicine have been introduced since the pandemic started in 2020, and can be convenient ways to receive medical care.  We offer virtual appointments as well to assist you in a variety of options to seek medical care.    Separate significant issues discussed: High blood pressure-continue current medication, follow-up with cardiology as planned  History of heart attack-continue current medications, follow-up with cardiology as planned or scheduled  History of hepatitis and cirrhosis of the liver-labs today, I recommend you avoid alcohol completely, limit Tylenol and over-the-counter pain medicine use, I recommend referral back to hepatitis clinic for updated surveillance.  I recommend endoscopy and colonoscopy for both screening and evaluation for esophageal varices  Obesity-I recommend efforts to lose weight through healthy diet and exercise  Bilateral hip pain-I recommend you go for baseline x-rays  Please go to [facility] for your hip xrays.   Their hours are 8am - 4:30 pm Monday - Friday.  Take your insurance card with you.  [facility] [phone]  [address], [address]  [place], [place] [address]  [place], [place] [address]   Impaired glucose, diabetes-labs today  Abnormal PFT breathing test/screen for COPD-2019 breathing test suggested COPD.  You report no symptoms today.  You are a former smoker.  If you are having any trouble breathing  or start to have difficulty breathing with exercise and let me or cardiology know  Decreased pulses in the legs-I recommend an updated ABI blood flow screen for the legs   Advanced Directives: I recommend you consider completing a [place] and Living Will.   These documents respect your wishes and help alleviate burdens on your loved ones if you were to become terminally ill or be in a position to need those documents enforced.    You can complete Advanced Directives yourself, have them notarized, then have copies made for our office, for you, and for anybody you feel should have them in safe keeping.  Or, you can have an attorney prepare these documents.   If you haven't updated your Last Will and Testament in a while, it may be worthwhile having an attorney prepare these documents together and save on some costs.   Sometimes credit unions offer affordable packages with attorneys to prepare these documents as a bundle.  Sometimes these documents are offered through employer legal subscription plans as well.    Yahshua was seen today for [employer].  Diagnoses and all orders for this visit:  Encounter for health maintenance examination in adult -     Hepatic function panel -     Renal Function Panel -     Lipid panel -     Hemoglobin A1c -     AFP tumor marker -     PSA -     HIV Antibody (routine testing w rflx) -     CBC with Differential/Platelet -     DG HIPS BILAT WITH PELVIS 3-4 VIEWS; Future  Medicare annual wellness visit, subsequent  Full dentures  Vaccine counseling  Screen for colon cancer  Impaired fasting blood sugar -     Hemoglobin A1c  Hyperlipidemia with target low density lipoprotein (LDL) cholesterol less than 70 mg/dL -     Lipid panel -     VAS [place] ABI WITH/WO TBI; Future  History of MI (myocardial infarction)  History of hepatitis C -     Hepatic function panel -     AFP tumor marker  Gastroesophageal reflux disease,  unspecified whether esophagitis present  Former smoker -     VAS [place] ABI WITH/WO TBI; Future  Essential hypertension -     VAS [place] ABI WITH/WO TBI; Future  Decreased pulses in feet -     VAS [place] ABI WITH/WO TBI; Future  Cirrhosis of liver without ascites, unspecified hepatic cirrhosis type (HCC) -     Hepatic function panel -     AFP tumor marker -     HIV Antibody (routine testing w rflx) -     CBC with Differential/Platelet -     Amb Referral to Hepatology  CAD S/P percutaneous coronary angioplasty -     VAS [place] ABI WITH/WO TBI; Future  Abnormal PFT  Hip pain, chronic, unspecified laterality -     DG HIPS BILAT WITH PELVIS 3-4 VIEWS; Future  Need for pneumococcal vaccination  Screening for prostate cancer -     PSA  Encounter for screening for vascular disease -     VAS [place] ABI WITH/WO TBI;  Future  Other orders -     Pneumococcal conjugate vaccine 13-valent     Medicare Attestation A preventative services visit was completed today.  During the course of the visit the patient was educated and counseled about appropriate screening and preventive services.  A health risk assessment was established with the patient that included a review of current medications, allergies, social history, family history, medical and preventative health history, biometrics, and preventative screenings to identify potential safety concerns or impairments.  A personalized plan was printed today for the patient's records and use.   Personalized health advice and education was given today to reduce health risks and promote self management and wellness.  Information regarding end of life planning was discussed today.  Dorothea Ogle, PA-C   08/13/2020

## 2020-08-14 LAB — HEPATIC FUNCTION PANEL
ALT: 13 IU/L (ref 0–44)
AST: 22 IU/L (ref 0–40)
Alkaline Phosphatase: 93 IU/L (ref 44–121)
Bilirubin Total: <0.2 mg/dL (ref 0.0–1.2)
Bilirubin, Direct: 0.1 mg/dL (ref 0.00–0.40)
Total Protein: 6.8 g/dL (ref 6.0–8.5)

## 2020-08-14 LAB — LIPID PANEL
Chol/HDL Ratio: 3.8 ratio (ref 0.0–5.0)
Cholesterol, Total: 163 mg/dL (ref 100–199)
HDL: 43 mg/dL (ref >39)
LDL Chol Calc (NIH): 81 mg/dL (ref 0–99)
Triglycerides: 236 mg/dL — ABNORMAL HIGH (ref 0–149)
VLDL Cholesterol Cal: 39 mg/dL (ref 5–40)

## 2020-08-14 LAB — CBC WITH DIFFERENTIAL/PLATELET
Basophils Absolute: 0 10*3/uL (ref 0.0–0.2)
Basos: 0 %
EOS (ABSOLUTE): 0.2 10*3/uL (ref 0.0–0.4)
Eos: 2 %
Hematocrit: 42.9 % (ref 37.5–51.0)
Hemoglobin: 14.7 g/dL (ref 13.0–17.7)
Immature Grans (Abs): 0 10*3/uL (ref 0.0–0.1)
Immature Granulocytes: 0 %
Lymphocytes Absolute: 3.1 10*3/uL (ref 0.7–3.1)
Lymphs: 44 %
MCH: 28.4 pg (ref 26.6–33.0)
MCHC: 34.3 g/dL (ref 31.5–35.7)
MCV: 83 fL (ref 79–97)
Monocytes Absolute: 0.5 10*3/uL (ref 0.1–0.9)
Monocytes: 7 %
Neutrophils Absolute: 3.3 10*3/uL (ref 1.4–7.0)
Neutrophils: 47 %
Platelets: 226 10*3/uL (ref 150–450)
RBC: 5.17 x10E6/uL (ref 4.14–5.80)
RDW: 13.8 % (ref 11.6–15.4)
WBC: 7 10*3/uL (ref 3.4–10.8)

## 2020-08-14 LAB — HEMOGLOBIN A1C
Est. average glucose Bld gHb Est-mCnc: 143 mg/dL
Hgb A1c MFr Bld: 6.6 % — ABNORMAL HIGH (ref 4.8–5.6)

## 2020-08-14 LAB — RENAL FUNCTION PANEL
Albumin: 4.4 g/dL (ref 3.8–4.8)
BUN/Creatinine Ratio: 10 (ref 10–24)
BUN: 10 mg/dL (ref 8–27)
CO2: 19 mmol/L — ABNORMAL LOW (ref 20–29)
Calcium: 9.4 mg/dL (ref 8.6–10.2)
Chloride: 104 mmol/L (ref 96–106)
Creatinine, Ser: 1.05 mg/dL (ref 0.76–1.27)
Glucose: 128 mg/dL — ABNORMAL HIGH (ref 65–99)
Phosphorus: 4.3 mg/dL — ABNORMAL HIGH (ref 2.8–4.1)
Potassium: 4 mmol/L (ref 3.5–5.2)
Sodium: 141 mmol/L (ref 134–144)
eGFR: 79 mL/min/{1.73_m2} (ref >59)

## 2020-08-14 LAB — HIV ANTIBODY (ROUTINE TESTING W REFLEX): HIV Screen 4th Generation wRfx: NONREACTIVE

## 2020-08-14 LAB — PSA: Prostate Specific Ag, Serum: 0.6 ng/mL (ref 0.0–4.0)

## 2020-08-14 LAB — AFP TUMOR MARKER: AFP, Serum, Tumor Marker: 6.6 ng/mL (ref 0.0–8.4)

## 2020-08-15 ENCOUNTER — Other Ambulatory Visit: Payer: Self-pay | Admitting: Medical

## 2020-08-15 DIAGNOSIS — E785 Hyperlipidemia, unspecified: Secondary | ICD-10-CM

## 2020-08-15 DIAGNOSIS — I10 Essential (primary) hypertension: Secondary | ICD-10-CM

## 2020-08-15 DIAGNOSIS — I2510 Atherosclerotic heart disease of native coronary artery without angina pectoris: Secondary | ICD-10-CM

## 2020-08-15 MED ORDER — ISOSORBIDE MONONITRATE ER 30 MG PO TB24: 30.0000 mg | ORAL_TABLET | Freq: Every day | ORAL | 3 refills | Status: DC

## 2020-08-15 MED ORDER — CARVEDILOL 25 MG PO TABS: ORAL_TABLET | ORAL | 3 refills | Status: DC

## 2020-08-15 MED ORDER — IRBESARTAN-HYDROCHLOROTHIAZIDE 300-12.5 MG PO TABS: 1.0000 | ORAL_TABLET | Freq: Every day | ORAL | 3 refills | Status: DC

## 2020-08-15 MED ORDER — AMLODIPINE BESYLATE 10 MG PO TABS: 10.0000 mg | ORAL_TABLET | Freq: Every day | ORAL | 3 refills | Status: DC

## 2020-08-15 MED ORDER — CLOPIDOGREL BISULFATE 75 MG PO TABS: 75.0000 mg | ORAL_TABLET | Freq: Every day | ORAL | 3 refills | Status: DC

## 2020-08-15 MED ORDER — VASCEPA 1 G PO CAPS: 2.0000 g | ORAL_CAPSULE | Freq: Two times a day (BID) | ORAL | 3 refills | Status: DC

## 2020-08-15 MED ORDER — ATORVASTATIN CALCIUM 20 MG PO TABS: 1.0000 | ORAL_TABLET | Freq: Every day | ORAL | 3 refills | Status: DC

## 2020-08-21 ENCOUNTER — Other Ambulatory Visit: Payer: Self-pay

## 2020-08-29 ENCOUNTER — Ambulatory Visit (INDEPENDENT_AMBULATORY_CARE_PROVIDER_SITE_OTHER): Payer: Medicare Other | Admitting: Internal Medicine

## 2020-08-29 ENCOUNTER — Other Ambulatory Visit: Payer: Self-pay

## 2020-08-29 ENCOUNTER — Encounter: Payer: Self-pay | Admitting: Internal Medicine

## 2020-08-29 VITALS — BP 130/75 | HR 68 | Wt 193.0 lb

## 2020-08-29 DIAGNOSIS — Z8619 Personal history of other infectious and parasitic diseases: Secondary | ICD-10-CM

## 2020-08-29 DIAGNOSIS — K7460 Unspecified cirrhosis of liver: Secondary | ICD-10-CM | POA: Diagnosis not present

## 2020-08-29 NOTE — Patient Instructions (Signed)
Thank you for coming to see me today. It was a pleasure seeing you.  To Do: [name] Labs today to ensure your Hepatitis C is still cured . Referral to [facility] for continued cirrhosis management.  If you have any questions or concerns, please do not hesitate to call the office at [phone].  Take Care,   Jule Ser, DO

## 2020-08-29 NOTE — Progress Notes (Signed)
[facility]  Reason for Consult: HCV  Referring Provider: Chana Bode   HPI:    Jay Fuller is a 65 y.o. male with PMHx as below who presents to the clinic for evaluation of HCV.   Patient has hx of cirrhosis and, per chart review, a history of genotype 1 hepatitis C.  Apparently he was treated at [facility] in 2015/2016 with 12-week course of Harvoni with SVR.  Other notes indicate hepatitis B surface Ab posiitive 2015 and hepatitis A vaccination done 2015.  Last abdominal US done in November 2019 with no focal liver lesion noted.  Saw Dr Fuller Plan on 10/24/2019 who noted that he was going to continue follow up with [facility] for continued management of his liver disease.  He confirms history of HCV treated with Harvoni for HCV in the setting of remote injection drug use.  Since his treatment course several years ago he has been abstinent from his risky behavior that led to infection previously.  Labs noted in Epic: March 2015 - Hep B surface Ag negative, Surface Ab positive.  HCV Ab reactive, RNA Quant 333,000.    Patient's Medications  New Prescriptions   No medications on file  Previous Medications   AMLODIPINE (NORVASC) 10 MG TABLET    Take 1 tablet (10 mg total) by mouth daily.   ATORVASTATIN (LIPITOR) 20 MG TABLET    Take 1 tablet (20 mg total) by mouth daily.   CARVEDILOL (COREG) 25 MG TABLET    TAKE ONE (1) TABLET BY MOUTH TWICE DAILY WITH A MEAL   CLOPIDOGREL (PLAVIX) 75 MG TABLET    Take 1 tablet (75 mg total) by mouth daily.   DIPHENHYDRAMINE (SOMINEX) 25 MG TABLET    Take 25 mg by mouth at bedtime.   IRBESARTAN-HYDROCHLOROTHIAZIDE (AVALIDE) 300-12.5 MG TABLET    Take 1 tablet by mouth daily.   ISOSORBIDE MONONITRATE (IMDUR) 30 MG 24 HR TABLET    Take 1 tablet (30 mg total) by mouth daily.   NITROGLYCERIN (NITROSTAT) 0.4 MG SL TABLET    PLACE 1 TABLET UNDER THE TONGUE EVERY 5 MINURES FOR 3 DOSES AS NEEDED FOR CHEST PAIN    VASCEPA 1 G CAPSULE    Take 2 capsules (2 g total) by mouth 2 (two) times daily.  Modified Medications   No medications on file  Discontinued Medications   No medications on file      Past Medical History:  Diagnosis Date  . CAD S/P percutaneous coronary angioplasty 06/2013   a. NSTEMI/Cath/PCI: LM nl, dLAD 20-40%, Diags small with ~95%; pLCx 20%, dLCx 99% * (DES PCI - 2.25x16 Promus Premier DES) & pOM1 80-90% (DES PCI: 2.5x20 Promus Premier DES), OM2 min irregs, LPL1 small, min irregs, RCA/RPDA min irregs, RPL nl, EF 55%.  . Chronic pain   . Cirrhosis ([place])    due to Hep C  . Former smoker    quit 2019  . Hepatitis B immune 2016  . Hepatitis C    s/p Harvoni therapy 2015  . History of NON-ST ELEVATION MI 06/2013   99% dLCx (DES PCI); 80-90% OM1 (DES PCI) --normal EF.  Normal wall motion.;   . HTN (hypertension)   . Hyperlipidemia   . Polysubstance abuse ([place])    history of cocaine, marijuana, tobacco abuse in the use  . Unstable angina ([place]) 07/11/2019  . Wears glasses     Social History   Tobacco Use  .  Smoking status: Former Smoker    Packs/day: 0.50    Years: 25.00    Pack years: 12.50    Quit date: 01/21/2016    Years since quitting: 4.6  . Smokeless tobacco: Never Used  Vaping Use  . Vaping Use: Never used  Substance Use Topics  . Alcohol use: Yes    Alcohol/week: 2.0 standard drinks    Types: 2 Cans of beer per week  . Drug use: Not Currently    Comment: cocain and marijuana in the past    Family History  Problem Relation Age of Onset  . Hypertension Mother   . Heart disease Father   . Hypertension Father   . Hypertension Sister   . Stroke Sister   . Hypertension Sister   . Cancer Sister        pancreatic  . Heart disease Sister        CHF  . Hypertension Sister   . Diabetes Neg Hx   . Colon cancer Neg Hx   . Stomach cancer Neg Hx   . Pancreatic cancer Neg Hx     No Known Allergies  Review of Systems  Constitutional: Negative for chills  and fever.  Respiratory: Negative.   Cardiovascular: Negative.   Gastrointestinal: Negative for abdominal pain, nausea and vomiting.  Skin: Negative for rash.      OBJECTIVE:    Vitals:   08/29/20 1453  BP: 130/75  Pulse: 68  Weight: 193 lb (87.5 kg)     Body mass index is 30.23 kg/m.  Physical Exam Constitutional:      General: He is not in acute distress.    Appearance: Normal appearance.  Eyes:     General: No scleral icterus.    Extraocular Movements: Extraocular movements intact.     Conjunctiva/sclera: Conjunctivae normal.  Pulmonary:     Effort: Pulmonary effort is normal. No respiratory distress.  Skin:    General: Skin is warm and dry.     Coloration: Skin is not jaundiced.  Neurological:     General: No focal deficit present.     Mental Status: He is alert and oriented to person, place, and time.  Psychiatric:        Mood and Affect: Mood normal.        Behavior: Behavior normal.      Labs and Microbiology:  CBC Latest Ref Rng & Units 08/13/2020 08/11/2019 07/15/2019  WBC 3.4 - 10.8 x10E3/uL 7.0 6.3 5.1  Hemoglobin 13.0 - 17.7 g/dL 14.7 13.7 13.5  Hematocrit 37.5 - 51.0 % 42.9 40.9 40.0  Platelets 150 - 450 x10E3/uL 226 193 193   CMP Latest Ref Rng & Units 08/13/2020 08/11/2019 07/15/2019  Glucose 65 - 99 mg/dL 128(H) 119(H) 126(H)  BUN 8 - 27 mg/dL 10 14 10   Creatinine 0.76 - 1.27 mg/dL 1.05 1.10 1.02  Sodium 134 - 144 mmol/L 141 139 136  Potassium 3.5 - 5.2 mmol/L 4.0 3.8 3.9  Chloride 96 - 106 mmol/L 104 102 98  CO2 20 - 29 mmol/L 19(L) 22 22  Calcium 8.6 - 10.2 mg/dL 9.4 9.8 9.3  Total Protein 6.0 - 8.5 g/dL 6.8 7.1 6.8  Total Bilirubin 0.0 - 1.2 mg/dL <0.2 0.3 0.3  Alkaline Phos 44 - 121 IU/L 93 83 92  AST 0 - 40 IU/L 22 25 28   ALT 0 - 44 IU/L 13 24 20      No results found for this or any previous visit (from the past 240 hour(s)).  Imaging: US Abdomen 03/2018 IMPRESSION: Cholelithiasis without sonographic evidence of acute  cholecystitis.  No focal liver lesion is identified. Mild inhomogeneity of liver echotexture is noted.    ASSESSMENT & PLAN:    History of hepatitis C Appears based on patient history and prior documentation that his HCV has been treated.  Will repeat his HCV RNA today to ensure this is the case.  If negative, does not need further antiviral treatment and should continue to follow up with [facility] for cirrhosis management.  If his HCV RNA is positive, then will have him return to clinic for further work up and treatment of his hepatitis C.  Cirrhosis of liver without ascites (HCC) He has history of cirrhosis and previously was seen by Dr Fuller Plan.  He would like to follow up with [facility] for cirrhosis management and so will place new referral today for patient.    Orders Placed This Encounter  Procedures  . Hepatitis C RNA quantitative  . Amb Referral to Hepatology    Referral Priority:   Routine    Referral Type:   Consultation    Number of Visits Requested:   [facility] 08/29/2020, 3:06 PM

## 2020-08-29 NOTE — Assessment & Plan Note (Signed)
He has history of cirrhosis and previously was seen by Dr Fuller Plan.  He would like to follow up with [facility] for cirrhosis management and so will place new referral today for patient.

## 2020-08-29 NOTE — Assessment & Plan Note (Signed)
Appears based on patient history and prior documentation that his HCV has been treated.  Will repeat his HCV RNA today to ensure this is the case.  If negative, does not need further antiviral treatment and should continue to follow up with [facility] for cirrhosis management.  If his HCV RNA is positive, then will have him return to clinic for further work up and treatment of his hepatitis C.

## 2020-08-31 LAB — HEPATITIS C RNA QUANTITATIVE
HCV Quantitative Log: <1.18 log IU/mL
HCV RNA, PCR, QN: <15 IU/mL

## 2020-09-04 ENCOUNTER — Telehealth: Payer: Self-pay

## 2020-09-04 NOTE — Telephone Encounter (Signed)
Left voicemail requesting a return call to review lab results.   Levii Hairfield Lorita Officer, RN

## 2020-09-04 NOTE — Telephone Encounter (Signed)
-----   Message from Mignon Pine, DO sent at 09/04/2020  9:07 AM EDT ----- Please let patient know that his HCV RNA remains undetectable indicating his HCV is cured and he does not need any further antivirals.  He does need to continue follow up with [facility] for cirrhosis management.   Thanks!

## 2020-09-05 NOTE — Telephone Encounter (Signed)
Patient advised of lab results and to continue following up with the [facility] for his cirrhosis. Patient verbalized understanding. Sakiyah Shur T Brooks Sailors

## 2020-09-12 ENCOUNTER — Ambulatory Visit (HOSPITAL_COMMUNITY): Payer: Medicare Other | Attending: Medical

## 2020-11-07 LAB — HM DIABETES EYE EXAM

## 2020-11-22 ENCOUNTER — Encounter: Payer: Self-pay | Admitting: Cardiology

## 2020-11-22 ENCOUNTER — Other Ambulatory Visit: Payer: Self-pay

## 2020-11-22 ENCOUNTER — Ambulatory Visit (INDEPENDENT_AMBULATORY_CARE_PROVIDER_SITE_OTHER): Payer: Medicare Other | Admitting: Cardiology

## 2020-11-22 VITALS — BP 128/72 | HR 62 | Resp 18 | Ht 67.0 in | Wt 190.8 lb

## 2020-11-22 DIAGNOSIS — I214 Non-ST elevation (NSTEMI) myocardial infarction: Secondary | ICD-10-CM

## 2020-11-22 DIAGNOSIS — I10 Essential (primary) hypertension: Secondary | ICD-10-CM

## 2020-11-22 DIAGNOSIS — Z9861 Coronary angioplasty status: Secondary | ICD-10-CM | POA: Diagnosis not present

## 2020-11-22 DIAGNOSIS — E785 Hyperlipidemia, unspecified: Secondary | ICD-10-CM

## 2020-11-22 DIAGNOSIS — I2510 Atherosclerotic heart disease of native coronary artery without angina pectoris: Secondary | ICD-10-CM | POA: Diagnosis not present

## 2020-11-22 MED ORDER — CARVEDILOL 25 MG PO TABS: ORAL_TABLET | ORAL | 3 refills | Status: DC

## 2020-11-22 NOTE — Progress Notes (Signed)
Primary Care Provider: Carlena Hurl, PA-C Cardiologist: [name], MD Electrophysiologist: None  Clinic Note: Chief Complaint  Patient presents with   Follow-up    Delayed annual follow-up.  Last seen April 2021; no major issues just some exertional dyspnea and fatigue   Coronary Artery Disease    1 episode of chest pain a month ago, otherwise no symptoms.     ===================================  ASSESSMENT/PLAN   Problem List Items Addressed This Visit       Cardiology Problems   CAD S/P percutaneous coronary angioplasty - Primary (Chronic)    Back in 2015 he had PCI to distal circumflex and OM1.  Patent by cath in 2021.  He is on maintenance dose clopidogrel along with stable dose of beta-blocker, calcium channel blocker for antianginal benefit and statin.  Plan: Continue clopidogrel monotherapy for secondary prevention at this point. Okay to hold Plavix 5-7 days preop for surgical procedures.  (5 days for most procedures, 7 days for high risk) Okay to hold Plavix 5 to 7 days for any bleeding issues.  Longer if indicated.  (However if longer than 1 week, would recommend starting 81 of aspirin in the place of Plavix) Continue beta-blocker, we will reduce his morning dose to 1/2 tablet. With changing blood pressure, I would just keep him on the Imdur despite the fact that we are trying to wean him off it before.  Continue amlodipine for blood pressure and antianginal benefit along with Avalide. Continue statin, but anticipate switching to rosuvastatin.      Relevant Medications   carvedilol (COREG) 25 MG tablet   Other Relevant Orders   EKG 12-Lead (Completed)   Hyperlipidemia with target low density lipoprotein (LDL) cholesterol less than 70 mg/dL (Chronic)    His LDL on last check was 81.  He is taking 20 mg atorvastatin and Vascepa.  He should be due for recheck soon with his PCP.  If indeed they are still not at goal, I would recommend converting from  atorvastatin to rosuvastatin at the same dose.  Triglycerides were also still elevated despite being on Vascepa.  We talked about adjustment in diet.  Wait for follow-up labs that should be done soon.       Relevant Medications   carvedilol (COREG) 25 MG tablet   Essential hypertension (Chronic)    Blood pressure looks pretty good today, but has been up and down.  Have asked that he follow his blood pressures at home.  With him having some exertional fatigue, I I Minna back down on his morning dose of carvedilol to 1/2 tablet.  If his blood pressures were to go up, we may need to do additional blood pressure control. Except for now I will have him take his amlodipine in the evening and continue taking Imdur but in the morning.       Relevant Medications   carvedilol (COREG) 25 MG tablet   Other Relevant Orders   EKG 12-Lead (Completed)   NSTEMI (non-ST elevated myocardial infarction) (HCC) (Chronic)    History of non-STEMI.  Thankfully, no residual wall motion normality on echocardiogram.  Circumflex/OM stents were patent by recent cath which was done for reportedly unstable angina.  Very reassuring results overall.  No residual issues.  No further angina or heart failure symptoms.       Relevant Medications   carvedilol (COREG) 25 MG tablet   For follow-up visit need to reassess blood pressure with change dose of beta-blocker, if not already ordered,  would need to recheck lipids.  ===================================  HPI:    Jay Fuller is a 65 y.o. male with a PMH below who presents today for delayed annual follow-up.  CAD Hx: NSTEMI 2015 s/p DES x 2 OM1/CFX Reestablish care with [facility] in March 2019 (prior to that had not been seen since initial post hospital follow-up with Ignacia Bayley, NP; his PCP is taking good care of his hypertension and hyperlipidemia. Other PMH: remote tob/THC/cocaine, HTN, HLD, Hep C s/p Harvoni, cirrhosis  Tayveon Lombardo was last  seen in April 2021 for post-hospital/cath f/u.  He had outpatient cardiac catheterization done o and n July 24, 2019 showing patent stents in the OM1 and distal LCx-OM 3.  Normal EDP and normal EF.  He indicated he is doing much better.  Chest pain had notably improved not causing any problems at the time.  He was just noticing a little bit of tiredness/fatigue with some exertional dyspnea.  Nothing like what he had prior to his cath. We had him wean off of Imdur after 3 months.  (He did not stop taking it. Was also told he can stop taking aspirin. Plan was to reevaluate labs in September but they were not done that were checked again in April by his PCP.  Recent Hospitalizations: None  Reviewed  CV studies:    The following studies were reviewed today: (if available, images/films reviewed: From Epic Chart or Care Everywhere) None:   Interval History:   Jay Fuller returns here today for delayed follow-up.  He is doing remarkably well but just notes mostly that he just gets a little bit short of breath with activity and just gets tired quicker than he had been before really not noticing any significant palpitations.  Very rare, and do not last more than about a minute.  He has a chest pain about a month ago that did not really seem to have much rhyme or reason, not similar to his previous angina.  He has not had any further episodes.  He remains very active and is started going to the [employer] 3 to 4 days a week.  He says he does about an hour on the treadmill and about 15 minutes walking on the track as well as some other gym exercises.  When he walks in the track he goes about a mile total.  He denies any chest pain or pressure associated with that.  No dyspnea.  CV Review of Symptoms (Summary) Cardiovascular ROS: positive for - dyspnea on exertion and exercise intolerance/fatigue.  1 episode of chest pain.  Rare palpitations. negative for - edema, orthopnea, paroxysmal nocturnal  dyspnea, rapid heart rate, shortness of breath, or lightheadedness, dizziness or wooziness, syncope/near syncope or TIA/amaurosis fugax, claudication  REVIEWED OF SYSTEMS   Review of Systems  Constitutional:  Negative for malaise/fatigue (Just some exercise intolerance.  Has a hard time starting to get up and go.) and weight loss.  Respiratory:  Negative for cough and shortness of breath.   Cardiovascular:        Per HPI  Gastrointestinal:  Negative for blood in stool and melena.  Genitourinary:  Negative for hematuria.  Musculoskeletal:  Positive for joint pain (Mild aches and pains). Negative for myalgias.  Neurological:  Negative for dizziness and focal weakness.  Psychiatric/Behavioral: Negative.     I have reviewed and (if needed) personally updated the patient's problem list, medications, allergies, past medical and surgical history, social and family history.   PAST MEDICAL HISTORY  Past Medical History:  Diagnosis Date   CAD S/P percutaneous coronary angioplasty 06/2013   a. NSTEMI/Cath/PCI: LM nl, dLAD 20-40%, Diags small with ~95%; pLCx 20%, dLCx 99% * (DES PCI - 2.25x16 Promus Premier DES) & pOM1 80-90% (DES PCI: 2.5x20 Promus Premier DES), OM2 min irregs, LPL1 small, min irregs, RCA/RPDA min irregs, RPL nl, EF 55%.   Chronic pain    Cirrhosis ([place])    due to Hep C   Former smoker    quit 2019   Hepatitis B immune 2016   Hepatitis C    s/p Harvoni therapy 2015   History of NON-ST ELEVATION MI 06/2013   99% dLCx (DES PCI); 80-90% OM1 (DES PCI) --normal EF.  Normal wall motion.;    HTN (hypertension)    Hyperlipidemia    Polysubstance abuse ([place])    history of cocaine, marijuana, tobacco abuse in the use   Unstable angina ([place]) 07/11/2019   Wears glasses     PAST SURGICAL HISTORY   Past Surgical History:  Procedure Laterality Date   COLONOSCOPY     denied, but Cologuard negative 12/2013   CORONARY ANGIOPLASTY WITH STENT PLACEMENT  06/2013    dLCx 99% going into  LPL1 (DES PCI: 2.25x16 Promus Premier DES), p OM1 80-90% (DES PCI - 2.5x20 Promus Premier DES)   LEFT HEART CATH AND CORONARY ANGIOGRAPHY N/A 07/18/2019   Procedure: LEFT HEART CATH AND CORONARY ANGIOGRAPHY;  Surgeon: Leonie Man, MD;  Location: [place] [facility];  Service: Cardiovascular;; Two-vessel CAD with widely patent stents in OM1 and distal LCx-OM 3.  Otherwise minimal disease. No obvious culprit lesion. Normal EF and EDP.   LEFT HEART CATHETERIZATION WITH CORONARY ANGIOGRAM N/A 06/23/2013   Procedure: LEFT HEART CATHETERIZATION WITH CORONARY ANGIOGRAM;  Surgeon: Leonie Man, MD;  Location: [facility] CATH LAB:: LM nl, d LAD 20-40%, small caliber Diags w/ severel ~95% lesions, p LCX 20%, dLCx 99% going into LPL1 (PCI- DES), p OM1 80-90% (PCI -DES), OM2 min irregs, LPL1 small, min irregs, RCA/RPDA min irregs, RPL nl, EF 55%.   LIVER BIOPSY  2016   TRANSTHORACIC ECHOCARDIOGRAM  06/2013   Normal LV size and moderate thickness/moderate LVH.  Normal EF 55-60%. No RWMA. Gr 1 DD. Mlid LA dilation   Cath 07/24/2019: Two-vessel CAD with widely patent stents in OM1 and distal LCx-OM 3.  Otherwise minimal disease. No obvious culprit lesion. Normal EF and EDP.     Immunization History  Administered Date(s) Administered   Hepatitis A, Adult 07/22/2013, 07/25/2015   Hepatitis B 02/06/1998, 03/12/1998, 07/19/1998   Influenza,inj,Quad PF,6+ Mos 06/24/2013, 01/22/2015, 01/20/2017, 02/24/2018   PFIZER(Purple Top)SARS-COV-2 Vaccination 07/19/2019, 08/09/2019   Pneumococcal Conjugate-13 08/13/2020   Pneumococcal Polysaccharide-23 06/24/2013   Tdap 06/14/2012    MEDICATIONS/ALLERGIES   Current Meds  Medication Sig   amLODipine (NORVASC) 10 MG tablet Take 1 tablet (10 mg total) by mouth daily.   atorvastatin (LIPITOR) 20 MG tablet Take 1 tablet (20 mg total) by mouth daily.   clopidogrel (PLAVIX) 75 MG tablet Take 1 tablet (75 mg total) by mouth daily.   diphenhydrAMINE (SOMINEX) 25 MG tablet Take  25 mg by mouth at bedtime.   irbesartan-hydrochlorothiazide (AVALIDE) 300-12.5 MG tablet Take 1 tablet by mouth daily.   isosorbide mononitrate (IMDUR) 30 MG 24 hr tablet Take 1 tablet (30 mg total) by mouth daily.   nitroGLYCERIN (NITROSTAT) 0.4 MG SL tablet PLACE 1 TABLET UNDER THE TONGUE EVERY 5 MINURES FOR 3 DOSES AS NEEDED FOR CHEST  PAIN   VASCEPA 1 g capsule Take 2 capsules (2 g total) by mouth 2 (two) times daily.   [DISCONTINUED] carvedilol (COREG) 25 MG tablet TAKE ONE (1) TABLET BY MOUTH TWICE DAILY WITH A MEAL    No Known Allergies  SOCIAL HISTORY/FAMILY HISTORY   Reviewed in Epic:  Pertinent findings:  Social History   Tobacco Use   Smoking status: Former    Packs/day: 0.50    Years: 25.00    Pack years: 12.50    Types: Cigarettes    Quit date: 01/21/2016    Years since quitting: 4.8   Smokeless tobacco: Never  Vaping Use   Vaping Use: Never used  Substance Use Topics   Alcohol use: Yes    Alcohol/week: 2.0 standard drinks    Types: 2 Cans of beer per week   Drug use: Not Currently    Comment: cocain and marijuana in the past   Social History   Social History Narrative   He does get somes some exercise with walking 1- 1 1/2 miles /day, currently painting cars part time.   Lives with girlfriend and son    OBJCTIVE -PE, EKG, labs   Wt Readings from Last 3 Encounters:  11/22/20 190 lb 12.8 oz (86.5 kg)  08/29/20 193 lb (87.5 kg)  08/13/20 193 lb 9.6 oz (87.8 kg)    Physical Exam: BP 128/72   Pulse 62   Resp 18   Ht 5\' 7"  (1.702 m)   Wt 190 lb 12.8 oz (86.5 kg)   SpO2 96%   BMI 29.88 kg/m  Physical Exam Vitals reviewed.  Constitutional:      General: He is not in acute distress.    Appearance: He is not ill-appearing or toxic-appearing.     Comments: Well-nourished, well-groomed.  Borderline obese.  Healthy-appearing.  HENT:     Head: Normocephalic and atraumatic.  Neck:     Vascular: No carotid bruit.  Cardiovascular:     Rate and Rhythm:  Normal rate and regular rhythm.     Pulses: Normal pulses.     Heart sounds: Normal heart sounds. No murmur heard.   No friction rub. No gallop.  Pulmonary:     Effort: Pulmonary effort is normal. No respiratory distress.     Breath sounds: Normal breath sounds. No wheezing, rhonchi or rales.  Chest:     Chest wall: No tenderness.  Musculoskeletal:        General: No swelling. Normal range of motion.     Cervical back: Normal range of motion and neck supple.  Skin:    General: Skin is warm and dry.  Neurological:     General: No focal deficit present.     Mental Status: He is alert and oriented to person, place, and time.     Motor: No weakness.  Psychiatric:        Mood and Affect: Mood normal.        Behavior: Behavior normal.        Thought Content: Thought content normal.        Judgment: Judgment normal.    Adult ECG Report  Rate: 62 ;  Rhythm: normal sinus rhythm and normal axis, intervals & durations ;   Narrative Interpretation: stable   Recent Labs:  reviewed -- due for recheck end of July   Lab Results  Component Value Date   CHOL 163 08/13/2020   HDL 43 08/13/2020   LDLCALC 81 08/13/2020   TRIG 236 (H) 08/13/2020  CHOLHDL 3.8 08/13/2020   Lab Results  Component Value Date   CREATININE 1.05 08/13/2020   BUN 10 08/13/2020   NA 141 08/13/2020   K 4.0 08/13/2020   CL 104 08/13/2020   CO2 19 (L) 08/13/2020   CBC Latest Ref Rng & Units 08/13/2020 08/11/2019 07/15/2019  WBC 3.4 - 10.8 x10E3/uL 7.0 6.3 5.1  Hemoglobin 13.0 - 17.7 g/dL 14.7 13.7 13.5  Hematocrit 37.5 - 51.0 % 42.9 40.9 40.0  Platelets 150 - 450 x10E3/uL 226 193 193    Lab Results  Component Value Date   TSH 1.840 06/23/2013    ==================================================  COVID-19 Education: The signs and symptoms of COVID-19 were discussed with the patient and how to seek care for testing (follow up with PCP or arrange E-visit).    I spent a total of 21 minutes with the patient  spent in direct patient consultation.  Additional time spent with chart review  / charting (studies, outside notes, etc): 16 min Total Time: 37 min  Current medicines are reviewed at length with the patient today.  (+/- concerns) n/a  This visit occurred during the SARS-CoV-2 public health emergency.  Safety protocols were in place, including screening questions prior to the visit, additional usage of staff PPE, and extensive cleaning of exam room while observing appropriate contact time as indicated for disinfecting solutions.  Notice: This dictation was prepared with Dragon dictation along with smaller phrase technology. Any transcriptional errors that result from this process are unintentional and may not be corrected upon review.  Patient Instructions / Medication Changes & Studies & Tests Ordered   Patient Instructions  Medication Instructions:   Decrease morning dose of Carvedilol to 12.5 mg ( 1/2 tablet of 25 mg ) and continue with 25 mg in the evening.  Continue all the other medications  *If you need a refill on your cardiac medications before your next appointment, please call your pharmacy*   Other Instructions Keep record of blood pressure and bring to next appointment    Lab Work: Not needed    Testing/Procedures: Not needed   Follow-Up: At [facility], you and your health needs are our priority.  As part of our continuing mission to provide you with exceptional heart care, we have created designated Provider Care Teams.  These Care Teams include your primary Cardiologist (physician) and Advanced Practice Providers (APPs -  Physician Assistants and Nurse Practitioners) who all work together to provide you with the care you need, when you need it.  We recommend signing up for the patient portal called "MyChart".  Sign up information is provided on this After Visit Summary.  MyChart is used to connect with patients for Virtual Visits (Telemedicine).  Patients are  able to view lab/test results, encounter notes, upcoming appointments, etc.  Non-urgent messages can be sent to your provider as well.   To learn more about what you can do with MyChart, go to [email or web address].    Your next appointment:   3 month(s)  The format for your next appointment:   In Person or virtual   Provider:   You may see [name], MD or one of the following Advanced Practice Providers on your designated Care Team:   Rosaria Ferries, PA-C Caron Presume, PA-C Jory Sims, DNP, ANP     Studies Ordered:   Orders Placed This Encounter  Procedures   EKG 12-Lead      [name], M.D., M.S. Interventional Cardiologist   Pager # [phone] Phone # [phone]  [address] [place],  [id number]   Thank you for choosing [facility] at [facility]!!

## 2020-11-22 NOTE — Patient Instructions (Addendum)
Medication Instructions:   Decrease morning dose of Carvedilol to 12.5 mg ( 1/2 tablet of 25 mg ) and continue with 25 mg in the evening.  Continue all the other medications  *If you need a refill on your cardiac medications before your next appointment, please call your pharmacy*   Other Instructions Keep record of blood pressure and bring to next appointment    Lab Work: Not needed    Testing/Procedures: Not needed   Follow-Up: At [facility], you and your health needs are our priority.  As part of our continuing mission to provide you with exceptional heart care, we have created designated Provider Care Teams.  These Care Teams include your primary Cardiologist (physician) and Advanced Practice Providers (APPs -  Physician Assistants and Nurse Practitioners) who all work together to provide you with the care you need, when you need it.  We recommend signing up for the patient portal called "MyChart".  Sign up information is provided on this After Visit Summary.  MyChart is used to connect with patients for Virtual Visits (Telemedicine).  Patients are able to view lab/test results, encounter notes, upcoming appointments, etc.  Non-urgent messages can be sent to your provider as well.   To learn more about what you can do with MyChart, go to [email or web address].    Your next appointment:   3 month(s)  The format for your next appointment:   In Person or virtual   Provider:   You may see [name], MD or one of the following Advanced Practice Providers on your designated Care Team:   Rosaria Ferries, PA-C Caron Presume, PA-C Jory Sims, DNP, ANP

## 2020-12-06 ENCOUNTER — Encounter: Payer: Self-pay | Admitting: Cardiology

## 2020-12-06 NOTE — Assessment & Plan Note (Signed)
Blood pressure looks pretty good today, but has been up and down.  Have asked that he follow his blood pressures at home.  With him having some exertional fatigue, I I Minna back down on his morning dose of carvedilol to 1/2 tablet.  If his blood pressures were to go up, we may need to do additional blood pressure control. Except for now I will have him take his amlodipine in the evening and continue taking Imdur but in the morning.

## 2020-12-06 NOTE — Assessment & Plan Note (Signed)
His LDL on last check was 81.  He is taking 20 mg atorvastatin and Vascepa.  He should be due for recheck soon with his PCP.  If indeed they are still not at goal, I would recommend converting from atorvastatin to rosuvastatin at the same dose.  Triglycerides were also still elevated despite being on Vascepa.  We talked about adjustment in diet.  Wait for follow-up labs that should be done soon.

## 2020-12-06 NOTE — Assessment & Plan Note (Signed)
Back in 2015 he had PCI to distal circumflex and OM1.  Patent by cath in 2021.  He is on maintenance dose clopidogrel along with stable dose of beta-blocker, calcium channel blocker for antianginal benefit and statin.  Plan:  Continue clopidogrel monotherapy for secondary prevention at this point.  Okay to hold Plavix 5-7 days preop for surgical procedures.  (5 days for most procedures, 7 days for high risk)  Okay to hold Plavix 5 to 7 days for any bleeding issues.  Longer if indicated.  (However if longer than 1 week, would recommend starting 81 of aspirin in the place of Plavix)  Continue beta-blocker, we will reduce his morning dose to 1/2 tablet.  With changing blood pressure, I would just keep him on the Imdur despite the fact that we are trying to wean him off it before.   Continue amlodipine for blood pressure and antianginal benefit along with Avalide.  Continue statin, but anticipate switching to rosuvastatin.

## 2020-12-06 NOTE — Assessment & Plan Note (Signed)
History of non-STEMI.  Thankfully, no residual wall motion normality on echocardiogram.  Circumflex/OM stents were patent by recent cath which was done for reportedly unstable angina.  Very reassuring results overall.  No residual issues.  No further angina or heart failure symptoms.

## 2020-12-17 ENCOUNTER — Other Ambulatory Visit: Payer: Self-pay

## 2020-12-17 ENCOUNTER — Ambulatory Visit (INDEPENDENT_AMBULATORY_CARE_PROVIDER_SITE_OTHER): Payer: Medicare Other | Admitting: Medical

## 2020-12-17 VITALS — BP 120/70 | HR 63 | Wt 192.4 lb

## 2020-12-17 DIAGNOSIS — I2510 Atherosclerotic heart disease of native coronary artery without angina pectoris: Secondary | ICD-10-CM

## 2020-12-17 DIAGNOSIS — Z87891 Personal history of nicotine dependence: Secondary | ICD-10-CM

## 2020-12-17 DIAGNOSIS — E119 Type 2 diabetes mellitus without complications: Secondary | ICD-10-CM

## 2020-12-17 DIAGNOSIS — E785 Hyperlipidemia, unspecified: Secondary | ICD-10-CM

## 2020-12-17 DIAGNOSIS — R7301 Impaired fasting glucose: Secondary | ICD-10-CM

## 2020-12-17 DIAGNOSIS — K7460 Unspecified cirrhosis of liver: Secondary | ICD-10-CM

## 2020-12-17 DIAGNOSIS — I10 Essential (primary) hypertension: Secondary | ICD-10-CM

## 2020-12-17 DIAGNOSIS — E118 Type 2 diabetes mellitus with unspecified complications: Secondary | ICD-10-CM | POA: Insufficient documentation

## 2020-12-17 DIAGNOSIS — Z9861 Coronary angioplasty status: Secondary | ICD-10-CM

## 2020-12-17 LAB — POCT GLYCOSYLATED HEMOGLOBIN (HGB A1C): Hemoglobin A1C: 6.1 % — AB (ref 4.0–5.6)

## 2020-12-17 NOTE — Progress Notes (Signed)
Subjective:  Jay Fuller is a 65 y.o. male who presents for Chief Complaint  Patient presents with   diabetes     Diabetes. No other concerns     Here for chronic disease follow-up on diabetes discussion  Hypertension-compliant with current medication  Hyperlipidemia-compliant with current medication  Last visit blood sugars were in the diabetic range whereas in the past he had been prediabetic.  He denies polyuria, polydipsia, weight change or blurred vision.  Overall feels fine without complaint  He does have cataracts and seeing eye doctor for this  No other aggravating or relieving factors.    No other c/o.   Past Medical History:  Diagnosis Date   CAD S/P percutaneous coronary angioplasty 06/2013   a. NSTEMI/Cath/PCI: LM nl, dLAD 20-40%, Diags small with ~95%; pLCx 20%, dLCx 99% * (DES PCI - 2.25x16 Promus Premier DES) & pOM1 80-90% (DES PCI: 2.5x20 Promus Premier DES), OM2 min irregs, LPL1 small, min irregs, RCA/RPDA min irregs, RPL nl, EF 55%.   Chronic pain    Cirrhosis ([place])    due to Hep C   Former smoker    quit 2019   Hepatitis B immune 2016   Hepatitis C    s/p Harvoni therapy 2015   History of NON-ST ELEVATION MI 06/2013   99% dLCx (DES PCI); 80-90% OM1 (DES PCI) --normal EF.  Normal wall motion.;    HTN (hypertension)    Hyperlipidemia    Polysubstance abuse ([place])    history of cocaine, marijuana, tobacco abuse in the use   Unstable angina ([place]) 07/11/2019   Wears glasses    Current Outpatient Medications on File Prior to Visit  Medication Sig Dispense Refill   amLODipine (NORVASC) 10 MG tablet Take 1 tablet (10 mg total) by mouth daily. 90 tablet 3   atorvastatin (LIPITOR) 20 MG tablet Take 1 tablet (20 mg total) by mouth daily. 90 tablet 3   carvedilol (COREG) 25 MG tablet Take one half tablet of 25 mg ( 12.5 mg) in the morning and  25 mg tablet  in the evening. 180 tablet 3   clopidogrel (PLAVIX) 75 MG tablet Take 1 tablet (75 mg total) by  mouth daily. 90 tablet 3   diphenhydrAMINE (SOMINEX) 25 MG tablet Take 25 mg by mouth at bedtime.     irbesartan-hydrochlorothiazide (AVALIDE) 300-12.5 MG tablet Take 1 tablet by mouth daily. 90 tablet 3   isosorbide mononitrate (IMDUR) 30 MG 24 hr tablet Take 1 tablet (30 mg total) by mouth daily. 90 tablet 3   nitroGLYCERIN (NITROSTAT) 0.4 MG SL tablet PLACE 1 TABLET UNDER THE TONGUE EVERY 5 MINURES FOR 3 DOSES AS NEEDED FOR CHEST PAIN 30 tablet 0   VASCEPA 1 g capsule Take 2 capsules (2 g total) by mouth 2 (two) times daily. 360 capsule 3   No current facility-administered medications on file prior to visit.     The following portions of the patient's history were reviewed and updated as appropriate: allergies, current medications, past family history, past medical history, past social history, past surgical history and problem list.  ROS Otherwise as in subjective above  Objective: BP 120/70   Pulse 63   Wt 192 lb 6.4 oz (87.3 kg)   SpO2 97%   BMI 30.13 kg/m   General appearance: alert, no distress, well developed, well nourished Otherwise not examined      Assessment: Encounter Diagnoses  Name Primary?   New onset type 2 diabetes mellitus ([place]) Yes  Impaired fasting blood sugar    Hyperlipidemia with target low density lipoprotein (LDL) cholesterol less than 70 mg/dL    CAD S/P percutaneous coronary angioplasty    Cirrhosis of liver without ascites, unspecified hepatic cirrhosis type [facility])    Former smoker    Essential hypertension      Plan: Diabetes new onset Discussed new diagnosis of diabetes, criteria to make the diagnosis, possible complications of diabetes, and the opportunity to make lifestyle changes to get this under control.  Discussed short term goals of diabetes care.    Discussed follow up, typically every 3 months, lab monitoring, importance of HgbA1C.   Discussed diet in great detail, importance of exercise.  Discussed vaccinations, discussed  general preventative measures including eye exams yearly with eye doctor, dental care, routine f/u here.   Discussed medications.  We will refrain from the medication.  He is diet controlled currently.  We discussed that he will likely need to be on some medicine in the future.  For right now to reduce burden on the liver and to reduce polypharmacy we will hold off on the medication  Discussed glucose monitoring.  He wants to hold off for now on starting glucometer.   Hypertension-continue current medication  Hyperlipidemia-continue current medication.  Reviewed labs from April 2022.  Triglycerides are not at goal.  He has seen his specialist since last visit here.  I reviewed his July 2022 cardiology notes and April 2022 hepatitis clinic notes.  History of NSTEMI 2015, CAD, history of remote THC and cocaine use.  Cardiology advised he can stop aspirin back and July 2022 visit.  Stable currently  History of hepatitis C status post Harvoni treatment.  He was advised to continue liver care with cirrhosis management with [facility].   Jay Fuller was seen today for diabetes .  Diagnoses and all orders for this visit:  New onset type 2 diabetes mellitus (HCC)  Impaired fasting blood sugar -     HgB A1c  Hyperlipidemia with target low density lipoprotein (LDL) cholesterol less than 70 mg/dL  CAD S/P percutaneous coronary angioplasty  Cirrhosis of liver without ascites, unspecified hepatic cirrhosis type [facility])  Former smoker  Essential hypertension  Spent > 45 minutes face to face with patient in discussion of symptoms, evaluation, plan and recommendations.    Follow up: 4-6 mo

## 2020-12-17 NOTE — Patient Instructions (Signed)
Type 2 Diabetes Diabetes is a long-lasting (chronic) disease.  With diabetes, either the pancreas does not make enough of a hormone called insulin, or the body has trouble using the insulin that is made. Over time, diabetes can damage the eyes, kidneys, and nerves causing retinopathy, nephropathy, and neuropathy.  Diabetes puts you at risk for heart disease and peripheral vascular disease which can lead to heart attack, stroke, foot ulcers, and amputations.   Our goal and hopefully your goal is to manage your diabetes in such a way to slow the progression of the disease and do all we can to keep you healthy  Home Care:  Eat healthy, exercise regularly, limit alcohol, and don't smoke! Check your blood sugar (glucose) once a day before breakfast, or as indicated by our discussion today.  The goal is to keep your morning fasting sugar less than 130.  In general you should try to keep your blood sugars between 80 - 130 fasting or before meals. We also want to avoid hypoglycemia or low blood sugars less than 70.  Make sure you understand how to treat hypoglycemic episodes.  Ask me about this if we had not discussed this. If you are on medication, take your medications daily, don't run out of medications. Learn about low blood sugar (hypoglycemia). Know how to treat it. Wear a necklace or bracelet that says you have diabetes in the event of emergency for first responders to identify you have diabetes. Check your feet every night for cuts, sores, blisters, and redness. Tell your medical provider if you have problems. Maintain a normal body weight, or normal BMI - height to weight ratio of 20-25.  Ask me about this.  GET HELP RIGHT AWAY IF: You have trouble keeping your blood sugar in target range. You have problems with your medicines. You are sick and not getting better after 24 hours. You have a sore or wound that is not healing. You have vision problems or changes. You have a fever.   Exercise  regularly since it has beneficial effects on the heart and blood sugars. Exercising at least three times per week or 150 minutes per week can be as important as medication to a diabetic.  Find some form of exercise that you will enjoy doing regularly.  This can include walking, biking, kayaking, golfing, swimming, dance, aerobics, hiking, etc.  If you have joint problems, many local gyms have equipment to accommodate people with specific needs.    Vaccinations:  Diabetics are at increased risk for infection, and illnesses can take longer to resolve.  Current vaccine recommendations include yearly Influenza (flu) vaccine (recommended in October), Pneumococcal vaccine, Hepatitis B vaccine series, Tdap (tetanus, diptheria and pertussis) vaccine every 10 years, Covid vaccination, and other age appropriate vaccinations.     Office visits:  We recommend routine medical care to make sure we are addressing prevention and issues as they arise.  Typically this could mean twice yearly or up to quarterly depending upon your unique health situation.  Exams should include a yearly physical, a yearly foot exam, and other examination as appropriate.  You should see an eye doctor yearly to help screen for and prevent blood vessel complications in your eyes.  Labs: Diabetics should have blood work done at least twice yearly to monitor your Hemoglobin A1C (a three-month average of your blood sugars) and your cholesterol.  You should have your urine and blood checked yearly to screen for kidney damage.  This may include creatinine and micro-albumin  levels.  Other labs as appropriate.    Blood pressure goals:  Goal blood pressure in diabetics should be 130/80 or less. Monitoring your blood pressure with a home blood pressure cuff of your own is an excellent idea.  If you are prescribed medication for blood pressure, take your medication every day, and don't run out of medication.  Having high blood pressure can damage your  heart, eyes, kidneys, and put you at risk for heart attack and stroke.  Tobacco use:  If you smoke, dip or chew, quitting will reduce your risk of heart attack, stroke, peripheral vascular disease, and many cancers.  Other Specialists:   Eye doctor: See your eye doctor yearly for routine eye exam and diabetic eye exam.  Make sure your eye doctor sends [place] a copy of the report  Dentist: See your dentist twice yearly for dental hygiene visits and routine dental care.  Brush and floss your teeth every day.  Dental hygiene is and important part of diabetic care.  Diabetics sometimes see other specialist depending on the complexity of your health issues.    Diabetic Report Card for Jay Fuller  December 17, 2020  Below is a summary of recent tests related to your diabetes that can help you manage your health.   Hemoglobin A1C:  Your Hemoglobin A1C values should be less than 7. If these are greater than 7, you have a higher chance of having eye, heart, and kidney problems in the future.   Your most recent Hemoglobin A1C values were:  Hgb A1c MFr Bld (%)  Date Value  08/13/2020 6.6 (H)  08/11/2019 6.2 (H)  02/24/2018 6.2 (H)     Cholesterol:  Your LDL Cholesterol (bad cholesterol) values should be less than 100 mg/dL if you do not have cardiovascular disease.  The LDL should be less than 70 mg/dL if you do have cardiovascular disease.  If your LDL is consistently higher than 100 mg/dL, then your risk of heart attack and stroke increases yearly.   Your most recent LDL Cholesterol (bad cholesterol) results were:  LDL Cholesterol (Calc) (mg/dL (calc))  Date Value  01/20/2017 64   LDL Chol Calc (NIH) (mg/dL)  Date Value  08/13/2020 81  08/11/2019 65  07/15/2019 68     Your HDL Cholesterol (good cholesterol) values should be higher than 40 mg/dL.  If your HDL is lower than 40 mg/dL, this increases your risk of heart attack and stroke.    Blood Pressure:  Your blood pressure  values should be less than 130/80. Please contact me if your readings at home are consistently higher than this.   Your most recent blood pressure readings at our clinic were:  BP Readings from Last 3 Encounters:  12/17/20 120/70  11/22/20 128/72  08/29/20 130/75    Urine Protein: Having an elevated microalbumin to creatinine ratio is a marker for early kidney damage due to diabetes or high blood pressure.     Your most recent urine protein results were:  Lab Results  Component Value Date   MICROALBUR 1.8 07/25/2015

## 2020-12-21 ENCOUNTER — Encounter: Payer: Self-pay | Admitting: Internal Medicine

## 2021-02-22 ENCOUNTER — Telehealth: Payer: Self-pay | Admitting: *Deleted

## 2021-02-22 ENCOUNTER — Telehealth: Payer: Medicare Other | Admitting: Cardiology

## 2021-02-22 NOTE — Telephone Encounter (Signed)
Second attempt no answer left message   will call again

## 2021-02-22 NOTE — Telephone Encounter (Signed)
Third attempt. No answer , left detailed message  please call to reschedule appointment for another day .

## 2021-02-22 NOTE — Telephone Encounter (Signed)
First attempt no answer left message

## 2021-02-27 NOTE — Progress Notes (Signed)
Cardiology Office Note:    Date:  02/28/2021   ID:  Arun Herrod, DOB 1956/01/10, MRN [id number]  PCP:  Carlena Hurl, PA-C  Cardiologist:  [name], MD  Electrophysiologist:  None   Referring MD: Carlena Hurl, PA-C   Chief Complaint: follow-up for CAD  History of Present Illness:    Jay Fuller is a 65 y.o. male with a history of CAD with NSTEMI in 2015 s/p DES to LCX and OM1, hypertension, hyperlipidemia, hepatitis C on Harvoni therapy, cirrhosis, chronic pain, and polysubstance abuse (tobacco, marijuana, and cocaine) who is followed by Dr. Ellyn Hack and presents today for routine follow-up.   Patient was admitted in 2015 with NSTEMI and underwent successful PCI with DES to LCX and OM1. Echo at that time showed LVEF of 55-60% with normal wall motion and grade 1 diastolic dysfunction. Last cath in 07/2019 showed widely patent stents and otherwise only minimal CAD.  Patient was last seen by Dr. Ellyn Hack in 11/2020 at which time he was doing  relatively well from a cardiac standpoint. He was continued on Plavix monotherapy. He did note some exertional fatigue so his Coreg was reduced to 12.5mg  in the morning and 25mg  in the evening.  Patient presents today for follow-up.  Here alone.  Overall patient has been doing well since his last visit.  He notes no difference in his exertional fatigue with reduced dose of Coreg.  He states that his exertional fatigue occurs intermittently and does happen every time he exerts himself.  He continues to have occasional left-sided chest pain that he describes as a "throbbing" sensation.  He ranks the pain is only 1-2/10 on the pain scale and says it occurs about twice a month.  Occurs randomly but is not brought on by exertion.  He states it will last for about 5 minutes at a time and then go away and return about 20 minutes later for one full day about twice a month.  Does not seem to be related to meals.  He states he has had this pain  for a while now and it is not getting any worse or occurring any more frequently. He has not had to take any sublingual Nitro. He goes to the [facility] about 3 times a week and walks on the treadmill for 15 to 20 minutes, walks on the track for 1/2 mile, and then rides a stationary bicycle for an additional 15 minutes.  He denies any chest pain or significant shortness of breath with this.  He reports occasional dyspnea on exertion but this is stable.  No orthopnea, PND, recent PND, or lower extremity edema.  He notes occasional lightheadedness/dizziness but no palpitations or syncope.  He is compliant with all his medications and tolerating them well.  Past Medical History:  Diagnosis Date   CAD S/P percutaneous coronary angioplasty 06/2013   a. NSTEMI/Cath/PCI: LM nl, dLAD 20-40%, Diags small with ~95%; pLCx 20%, dLCx 99% * (DES PCI - 2.25x16 Promus Premier DES) & pOM1 80-90% (DES PCI: 2.5x20 Promus Premier DES), OM2 min irregs, LPL1 small, min irregs, RCA/RPDA min irregs, RPL nl, EF 55%.   Chronic pain    Cirrhosis ([place])    due to Hep C   Former smoker    quit 2019   Hepatitis B immune 2016   Hepatitis C    s/p Harvoni therapy 2015   History of NSTEMI 06/2013   99% dLCx (DES PCI); 80-90% OM1 (DES PCI) --normal EF.  Normal wall motion.;  Hyperlipidemia    Hypertension    Polysubstance abuse ([place])    history of cocaine, marijuana, tobacco abuse in the use   Wears glasses     Past Surgical History:  Procedure Laterality Date   COLONOSCOPY     denied, but Cologuard negative 12/2013   CORONARY ANGIOPLASTY WITH STENT PLACEMENT  06/2013    dLCx 99% going into LPL1 (DES PCI: 2.25x16 Promus Premier DES), p OM1 80-90% (DES PCI - 2.5x20 Promus Premier DES)   LEFT HEART CATH AND CORONARY ANGIOGRAPHY N/A 07/18/2019   Procedure: LEFT HEART CATH AND CORONARY ANGIOGRAPHY;  Surgeon: Leonie Man, MD;  Location: [facility];  Service: Cardiovascular;; Two-vessel CAD with widely patent stents in  OM1 and distal LCx-OM 3.  Otherwise minimal disease. No obvious culprit lesion. Normal EF and EDP.   LEFT HEART CATHETERIZATION WITH CORONARY ANGIOGRAM N/A 06/23/2013   Procedure: LEFT HEART CATHETERIZATION WITH CORONARY ANGIOGRAM;  Surgeon: Leonie Man, MD;  Location: [facility] CATH LAB:: LM nl, d LAD 20-40%, small caliber Diags w/ severel ~95% lesions, p LCX 20%, dLCx 99% going into LPL1 (PCI- DES), p OM1 80-90% (PCI -DES), OM2 min irregs, LPL1 small, min irregs, RCA/RPDA min irregs, RPL nl, EF 55%.   LIVER BIOPSY  2016   TRANSTHORACIC ECHOCARDIOGRAM  06/2013   Normal LV size and moderate thickness/moderate LVH.  Normal EF 55-60%. No RWMA. Gr 1 DD. Mlid LA dilation    Current Medications: Current Meds  Medication Sig   amLODipine (NORVASC) 10 MG tablet Take 1 tablet (10 mg total) by mouth daily.   atorvastatin (LIPITOR) 20 MG tablet Take 1 tablet (20 mg total) by mouth daily.   carvedilol (COREG) 25 MG tablet Take one half tablet of 25 mg ( 12.5 mg) in the morning and  25 mg tablet  in the evening.   clopidogrel (PLAVIX) 75 MG tablet Take 1 tablet (75 mg total) by mouth daily.   diphenhydrAMINE (SOMINEX) 25 MG tablet Take 25 mg by mouth at bedtime.   irbesartan-hydrochlorothiazide (AVALIDE) 300-12.5 MG tablet Take 1 tablet by mouth daily.   isosorbide mononitrate (IMDUR) 30 MG 24 hr tablet Take 1 tablet (30 mg total) by mouth daily.   nitroGLYCERIN (NITROSTAT) 0.4 MG SL tablet PLACE 1 TABLET UNDER THE TONGUE EVERY 5 MINURES FOR 3 DOSES AS NEEDED FOR CHEST PAIN   VASCEPA 1 g capsule Take 2 capsules (2 g total) by mouth 2 (two) times daily.     Allergies:   Patient has no known allergies.   Social History   Socioeconomic History   Marital status: Single    Spouse name: Not on file   Number of children: Not on file   Years of education: Not on file   Highest education level: Not on file  Occupational History   Not on file  Tobacco Use   Smoking status: Former    Packs/day: 0.50     Years: 25.00    Pack years: 12.50    Types: Cigarettes    Quit date: 01/21/2016    Years since quitting: 5.1   Smokeless tobacco: Never  Vaping Use   Vaping Use: Never used  Substance and Sexual Activity   Alcohol use: Yes    Alcohol/week: 2.0 standard drinks    Types: 2 Cans of beer per week   Drug use: Not Currently    Comment: cocain and marijuana in the past   Sexual activity: Yes  Other Topics Concern   Not on  file  Social History Narrative   He does get somes some exercise with walking 1- 1 1/2 miles /day, currently painting cars part time.   Lives with girlfriend and son   Social Determinants of Health   Financial Resource Strain: Not on file  Food Insecurity: Not on file  Transportation Needs: Not on file  Physical Activity: Not on file  Stress: Not on file  Social Connections: Not on file     Family History: The patient's family history includes Cancer in his sister; Heart disease in his father and sister; Hypertension in his father, mother, sister, sister, and sister; Stroke in his sister. There is no history of Diabetes, Colon cancer, Stomach cancer, or Pancreatic cancer.  ROS:   Please see the history of present illness.     EKGs/Labs/Other Studies Reviewed:    The following studies were reviewed today:  Echocardiogram 06/24/2013: Study Conclusions: - Left ventricle: The cavity size was normal. Wall thickness    was increased in a pattern of moderate LVH. Systolic    function was normal. The estimated ejection fraction was    in the range of 55% to 60%. Wall motion was normal; there    were no regional wall motion abnormalities. Doppler    parameters are consistent with abnormal left ventricular    relaxation (grade 1 diastolic dysfunction).  - Left atrium: The atrium was mildly dilated.  _______________  Left Cardiac Catheterization 07/18/2019: Previously placed 1st Mrg stent (Promus Premier DES 2.5 mm x 20 mm - 2.75 mm) is widely patent. Previously  placed dLCx/3rd Mrg-1 Stent (Promus Premier DES 2.25 mm x 37mm - 2.5 mm) is widely patent. DLCx-3rd Mrg-2 lesion is 30% stenosed. The left ventricular systolic function is The left ventricular ejection fraction is 55-65% by visual estimate. LV end diastolic pressure is normal.   Summary: Two-vessel CAD with widely patent stents in OM1 and distal LCx-OM 3.  Otherwise minimal disease. No obvious culprit lesion. Normal EF and EDP.   Recommendations: Okay to discharge home after bedrest Consider nonanginal etiology for chest pain-less likely microvascular disease. Follow-up as scheduled.  Diagnostic Dominance: Right   EKG:  EKG not ordered today.   Recent Labs: 08/13/2020: ALT 13; BUN 10; Creatinine, Ser 1.05; Hemoglobin 14.7; Platelets 226; Potassium 4.0; Sodium 141  Recent Lipid Panel    Component Value Date/Time   CHOL 163 08/13/2020 1028   TRIG 236 (H) 08/13/2020 1028   HDL 43 08/13/2020 1028   CHOLHDL 3.8 08/13/2020 1028   CHOLHDL 3.1 01/20/2017 0933   VLDL 48 (H) 07/25/2015 0001   LDLCALC 81 08/13/2020 1028   LDLCALC 64 01/20/2017 0933    Physical Exam:    Vital Signs: BP (!) 150/78   Pulse 70   Ht 5\' 7"  (1.702 m)   Wt 193 lb 6.4 oz (87.7 kg)   SpO2 96%   BMI 30.29 kg/m     Wt Readings from Last 3 Encounters:  02/28/21 193 lb 6.4 oz (87.7 kg)  12/17/20 192 lb 6.4 oz (87.3 kg)  11/22/20 190 lb 12.8 oz (86.5 kg)     General: 65 y.o. African-American male in no acute distress. HEENT: Normocephalic and atraumatic. Sclera clear. EOMs intact. Neck: Supple. No carotid bruits. No JVD. Heart:  RRR. Distinct S1 and S2. No murmurs, gallops, or rubs. Radial pulses 2+ and equal bilaterally. Lungs: No increased work of breathing. Clear to ausculation bilaterally. No wheezes, rhonchi, or rales.  Abdomen: Soft, non-distended, and non-tender to palpation.  Extremities:  No lower extremity edema.    Skin: Warm and dry. Neuro: Alert and oriented x3. No focal deficits. Psych:  Normal affect. Responds appropriately.  Assessment:    1. Coronary artery disease involving native coronary artery of native heart with angina pectoris ([place])   2. Primary hypertension   3. Hyperlipidemia, unspecified hyperlipidemia type   4. Medication management     Plan:    Chest Pain CAD - History of NSTEMI in 2015 s/p DES to LCX and OM1. Last cath in 07/2019 showed patent stents with otherwise only minimal CAD.  - He notes occasional left sided chest pain that is not related to exertion. This is not new and is stable. - Continue Plavix monotherapy.  - Continue antianginals: Coreg 12.5mg  in the morning and 25mg  in the evening, Amlodipine 10mg  daily, and Imdur 30mg  daily. - Continue statin.  Hypertension - BP elevated at 150/78 (was 162/82 on my recheck). He states BP is usually better controlled at home around 135/70. - Continue current medications: Amlodipine 10mg  daily, Coreg 12.5mg  in the morning and 25mg  in the evening, Imdur 30mg  daily, and Irbesartan-HCTZ 300-12.5mg  daily.  - Will have patient keep a BP/HR log for 2 weeks and then send [place] this. If BP consistently above 130/80, will likely increase Coreg back to 25mg  twice daily given patient denies any improvement in intermittent exertional fatigue with reduced dose.   Hyperlipidemia - Lipid panel in 08/2020: Total Cholesterol 163, Triglycerides 236, HDL 43, LDL 81.  - LDL goal <70 given CAD.  - Currently on Lipitor 20mg  daily and Vascepa 2g twice daily.  - Patient is not fasting today. Will have him come back for fasting lipid panel and LFTs. If LDL still above goal, will switch to Crestor 20mg  daily per Dr. Allison Quarry last note.  Disposition: Follow up in 6 months.    Medication Adjustments/Labs and Tests Ordered: Current medicines are reviewed at length with the patient today.  Concerns regarding medicines are outlined above.  Orders Placed This Encounter  Procedures   Lipid Profile   Hepatic function panel   No  orders of the defined types were placed in this encounter.   Patient Instructions  Medication Instructions:  The current medical regimen is effective;  continue present plan and medications as directed. Please refer to the Current Medication list given to you today.  *If you need a refill on your cardiac medications before your next appointment, please call your pharmacy*  Lab Work: FASTING LIPID AND LFT AS SOON AS YOU CAN-NOTHING TO EAT AFTER MIDNIGHT If you have labs (blood work) drawn today and your tests are completely normal, you will receive your results only by:  [place] (if you have MyChart) OR A paper copy in the mail.  If you have any lab test that is abnormal or we need to change your treatment, we will call you to review the results. You may go to any [facility] that is convenient for you however, we do have a lab in our office that is able to assist you. You DO NOT need an appointment for our lab. The lab is open 8:00am and closes at 4:00pm. Lunch 12:45 - 1:45pm.  Special Instructions TAKE AND LOG YOUR BLOOD PRESSURE AT LEAST 1 HOUR AFTER TAKING YOUR MEDICATION- FOR 2 WEEKS THEN LET us KNOW YOUR NUMBERS.  Follow-Up: Your next appointment:  6 month(s) In Person with [name], MD OR IF UNAVAILABLE Sande Rives, PA-C   Please call our office 2 months in advance  to schedule this appointment   At [facility], you and your health needs are our priority.  As part of our continuing mission to provide you with exceptional heart care, we have created designated Provider Care Teams.  These Care Teams include your primary Cardiologist (physician) and Advanced Practice Providers (APPs -  Physician Assistants and Nurse Practitioners) who all work together to provide you with the care you need, when you need it.  We recommend signing up for the patient portal called "MyChart".  Sign up information is provided on this After Visit Summary.  MyChart is used to connect with patients  for Virtual Visits (Telemedicine).  Patients are able to view lab/test results, encounter notes, upcoming appointments, etc.  Non-urgent messages can be sent to your provider as well.   To learn more about what you can do with MyChart, go to [email or web address].         Signed, Darreld Mclean, PA-C  02/28/2021 2:52 PM    [facility]

## 2021-02-28 ENCOUNTER — Encounter: Payer: Self-pay | Admitting: Student

## 2021-02-28 ENCOUNTER — Other Ambulatory Visit: Payer: Self-pay

## 2021-02-28 ENCOUNTER — Ambulatory Visit (INDEPENDENT_AMBULATORY_CARE_PROVIDER_SITE_OTHER): Payer: Medicare Other | Admitting: Student

## 2021-02-28 VITALS — BP 150/78 | HR 70 | Ht 67.0 in | Wt 193.4 lb

## 2021-02-28 DIAGNOSIS — I25119 Atherosclerotic heart disease of native coronary artery with unspecified angina pectoris: Secondary | ICD-10-CM

## 2021-02-28 DIAGNOSIS — I10 Essential (primary) hypertension: Secondary | ICD-10-CM | POA: Diagnosis not present

## 2021-02-28 DIAGNOSIS — Z79899 Other long term (current) drug therapy: Secondary | ICD-10-CM

## 2021-02-28 DIAGNOSIS — E785 Hyperlipidemia, unspecified: Secondary | ICD-10-CM | POA: Diagnosis not present

## 2021-02-28 NOTE — Patient Instructions (Signed)
Medication Instructions:  The current medical regimen is effective;  continue present plan and medications as directed. Please refer to the Current Medication list given to you today.  *If you need a refill on your cardiac medications before your next appointment, please call your pharmacy*  Lab Work: FASTING LIPID AND LFT AS SOON AS YOU CAN-NOTHING TO EAT AFTER MIDNIGHT If you have labs (blood work) drawn today and your tests are completely normal, you will receive your results only by:  [place] (if you have MyChart) OR A paper copy in the mail.  If you have any lab test that is abnormal or we need to change your treatment, we will call you to review the results. You may go to any [facility] that is convenient for you however, we do have a lab in our office that is able to assist you. You DO NOT need an appointment for our lab. The lab is open 8:00am and closes at 4:00pm. Lunch 12:45 - 1:45pm.  Special Instructions TAKE AND LOG YOUR BLOOD PRESSURE AT LEAST 1 HOUR AFTER TAKING YOUR MEDICATION- FOR 2 WEEKS THEN LET us KNOW YOUR NUMBERS.  Follow-Up: Your next appointment:  6 month(s) In Person with [name], MD OR IF UNAVAILABLE Sande Rives, PA-C   Please call our office 2 months in advance to schedule this appointment   At [facility], you and your health needs are our priority.  As part of our continuing mission to provide you with exceptional heart care, we have created designated Provider Care Teams.  These Care Teams include your primary Cardiologist (physician) and Advanced Practice Providers (APPs -  Physician Assistants and Nurse Practitioners) who all work together to provide you with the care you need, when you need it.  We recommend signing up for the patient portal called "MyChart".  Sign up information is provided on this After Visit Summary.  MyChart is used to connect with patients for Virtual Visits (Telemedicine).  Patients are able to view lab/test results,  encounter notes, upcoming appointments, etc.  Non-urgent messages can be sent to your provider as well.   To learn more about what you can do with MyChart, go to [email or web address].

## 2021-03-22 LAB — LIPID PANEL
Chol/HDL Ratio: 4.2 ratio (ref 0.0–5.0)
Cholesterol, Total: 184 mg/dL (ref 100–199)
HDL: 44 mg/dL (ref >39)
LDL Chol Calc (NIH): 87 mg/dL (ref 0–99)
Triglycerides: 326 mg/dL — ABNORMAL HIGH (ref 0–149)
VLDL Cholesterol Cal: 53 mg/dL — ABNORMAL HIGH (ref 5–40)

## 2021-03-22 LAB — HEPATIC FUNCTION PANEL
ALT: 20 IU/L (ref 0–44)
AST: 33 IU/L (ref 0–40)
Albumin: 4.7 g/dL (ref 3.8–4.8)
Alkaline Phosphatase: 92 IU/L (ref 44–121)
Bilirubin Total: 0.4 mg/dL (ref 0.0–1.2)
Bilirubin, Direct: 0.14 mg/dL (ref 0.00–0.40)
Total Protein: 7 g/dL (ref 6.0–8.5)

## 2021-03-25 ENCOUNTER — Telehealth: Payer: Self-pay | Admitting: *Deleted

## 2021-03-25 NOTE — Telephone Encounter (Signed)
Patient brought in a blood pressure log which shows mostly recordings are 130/70 8-1 40/74.  There are some recordings as low as 128/70 but for the most part over 130/70 mmHg.   For the last clinic note by Sande Rives, recommendation was to increase back up to 25 mg twice daily of carvedilol.  He is currently taking 12.5 mg in the morning and 20 5 in the evening.  She did not notice any significant change in energy level with the lower dose during the day.  Therefore we will increase back up to the full 25 mg morning.  New RX: Carvedilol 25 mg p.o. twice daily; dispense 180 tabs, 3 refills  [name], MD

## 2021-03-25 NOTE — Telephone Encounter (Signed)
-----   Message from Darreld Mclean, [place] sent at 03/23/2021 11:56 PM EST ----- Please call patient with results: LDL (bad cholesterol) still above goal. Triglycerides also elevated. Let's stop Lipitor and start Crestor 20mg  daily instead and then repeat lipid panel and LFTs in 2-3 months. Patient should also continue to work of diet modification.  Thank you!

## 2021-03-25 NOTE — Telephone Encounter (Signed)
Left message to call --in regards  to lab result

## 2021-04-19 ENCOUNTER — Telehealth: Payer: Self-pay | Admitting: Student

## 2021-04-19 ENCOUNTER — Other Ambulatory Visit: Payer: Self-pay

## 2021-04-19 DIAGNOSIS — Z79899 Other long term (current) drug therapy: Secondary | ICD-10-CM

## 2021-04-19 DIAGNOSIS — E785 Hyperlipidemia, unspecified: Secondary | ICD-10-CM

## 2021-04-19 MED ORDER — ROSUVASTATIN CALCIUM 20 MG PO TABS: 20.0000 mg | ORAL_TABLET | Freq: Every day | ORAL | 3 refills | Status: DC

## 2021-04-19 NOTE — Telephone Encounter (Signed)
Patient called in regards to the letter he received.  I advised of lab results, advised of med change. Updated RX, ordered repeat blood work for 2-3 months. Patient made aware to come in to get repeat blood work, patient verbalized understanding, thankful for call back.  Routing to nurse as Juluis Rainier.  Thanks!

## 2021-04-19 NOTE — Telephone Encounter (Signed)
Jay Fuller is calling due to getting the letter in the mail in regards to his lab results from November advising him to make a medication changes. Please advise.

## 2021-07-05 ENCOUNTER — Encounter: Payer: Self-pay | Admitting: Medical

## 2021-07-23 LAB — COLOGUARD: COLOGUARD: NEGATIVE

## 2021-08-22 ENCOUNTER — Ambulatory Visit: Payer: Medicare Other | Admitting: Medical

## 2021-09-13 ENCOUNTER — Ambulatory Visit (INDEPENDENT_AMBULATORY_CARE_PROVIDER_SITE_OTHER): Payer: Medicare Other

## 2021-09-13 VITALS — Ht 67.0 in | Wt 190.0 lb

## 2021-09-13 DIAGNOSIS — Z0000 Encounter for general adult medical examination without abnormal findings: Secondary | ICD-10-CM

## 2021-09-13 NOTE — Progress Notes (Signed)
?I connected with [name] today by telephone and verified that I am speaking with the correct person using two identifiers. ?Location patient: home ?Location provider: work ?Persons participating in the virtual visit: Erasto, Sleight LPN. ?  ?I discussed the limitations, risks, security and privacy concerns of performing an evaluation and management service by telephone and the availability of in person appointments. I also discussed with the patient that there may be a patient responsible charge related to this service. The patient expressed understanding and verbally consented to this telephonic visit.  ?  ?Interactive audio and video telecommunications were attempted between this provider and patient, however failed, due to patient having technical difficulties OR patient did not have access to video capability.  We continued and completed visit with audio only. ? ?  ? ?Vital signs may be patient reported or missing. ? ?Subjective:  ? Jay Fuller is a 66 y.o. male who presents for Medicare Annual/Subsequent preventive examination. ? ?Review of Systems    ? ?Cardiac Risk Factors include: advanced age (>61mn, >>85women);diabetes mellitus;hypertension;male gender ? ?   ?Objective:  ?  ?Today's Vitals  ? 09/13/21 1526  ?Weight: 190 lb (86.2 kg)  ?Height: '5\' 7"'$  (1.702 m)  ? ?Body mass index is 29.76 kg/m?. ? ? ?  09/13/2021  ?  3:31 PM 07/18/2019  ?  5:58 AM 12/15/2016  ?  2:15 PM 03/11/2016  ?  5:06 PM 07/01/2015  ? 11:00 AM 06/22/2013  ? 11:00 PM  ?Advanced Directives  ?Does Patient Have a Medical Advance Directive? No No No No No Patient does not have advance directive;Patient would like information  ?Would patient like information on creating a medical advance directive?  Yes (MAU/Ambulatory/Procedural Areas - Information given)   No - patient declined information Advance directive packet given  ?Pre-existing out of facility DNR order (yellow form or pink MOST form)      No   ? ? ?Current Medications (verified) ?Outpatient Encounter Medications as of 09/13/2021  ?Medication Sig  ? amLODipine (NORVASC) 10 MG tablet Take 1 tablet (10 mg total) by mouth daily.  ? carvedilol (COREG) 25 MG tablet Take one half tablet of 25 mg ( 12.5 mg) in the morning and  25 mg tablet  in the evening.  ? clopidogrel (PLAVIX) 75 MG tablet Take 1 tablet (75 mg total) by mouth daily.  ? diphenhydrAMINE (SOMINEX) 25 MG tablet Take 25 mg by mouth at bedtime.  ? irbesartan-hydrochlorothiazide (AVALIDE) 300-12.5 MG tablet Take 1 tablet by mouth daily.  ? isosorbide mononitrate (IMDUR) 30 MG 24 hr tablet Take 1 tablet (30 mg total) by mouth daily.  ? nitroGLYCERIN (NITROSTAT) 0.4 MG SL tablet PLACE 1 TABLET UNDER THE TONGUE EVERY 5 MINURES FOR 3 DOSES AS NEEDED FOR CHEST PAIN  ? VASCEPA 1 g capsule Take 2 capsules (2 g total) by mouth 2 (two) times daily.  ? rosuvastatin (CRESTOR) 20 MG tablet Take 1 tablet (20 mg total) by mouth daily.  ? ?No facility-administered encounter medications on file as of 09/13/2021.  ? ? ?Allergies (verified) ?Patient has no known allergies.  ? ?History: ?Past Medical History:  ?Diagnosis Date  ? CAD S/P percutaneous coronary angioplasty 06/2013  ? a. NSTEMI/Cath/PCI: LM nl, dLAD 20-40%, Diags small with ~95%; pLCx 20%, dLCx 99% * (DES PCI - 2.25x16 Promus Premier DES) & pOM1 80-90% (DES PCI: 2.5x20 Promus Premier DES), OM2 min irregs, LPL1 small, min irregs, RCA/RPDA min irregs, RPL nl, EF 55%.  ? Chronic pain   ?  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 Former smoker   ? quit 2019  ? Hepatitis B immune 2016  ? Hepatitis C   ? s/p Harvoni therapy 2015  ? History of NSTEMI 06/2013  ? 99% dLCx (DES PCI); 80-90% OM1 (DES PCI) --normal EF.  Normal wall motion.;   ? Hyperlipidemia   ? Hypertension   ? Polysubstance abuse ([place])   ? history of cocaine, marijuana, tobacco abuse in the use  ? Wears glasses   ? ?Past Surgical History:  ?Procedure Laterality Date  ? COLONOSCOPY    ? denied, but  Cologuard negative 12/2013  ? CORONARY ANGIOPLASTY WITH STENT PLACEMENT  06/2013  ?  dLCx 99% going into LPL1 (DES PCI: 2.25x16 Promus Premier DES), p OM1 80-90% (DES PCI - 2.5x20 Promus Premier DES)  ? LEFT HEART CATH AND CORONARY ANGIOGRAPHY N/A 07/18/2019  ? Procedure: LEFT HEART CATH AND CORONARY ANGIOGRAPHY;  Surgeon: Leonie Man, MD;  Location: [facility];  Service: Cardiovascular;; Two-vessel CAD with widely patent stents in OM1 and distal LCx-OM 3.  Otherwise minimal disease. No obvious culprit lesion. Normal EF and EDP.  ? LEFT HEART CATHETERIZATION WITH CORONARY ANGIOGRAM N/A 06/23/2013  ? Procedure: LEFT HEART CATHETERIZATION WITH CORONARY ANGIOGRAM;  Surgeon: Leonie Man, MD;  Location: [facility] CATH LAB:: LM nl, d LAD 20-40%, small caliber Diags w/ severel ~95% lesions, p LCX 20%, dLCx 99% going into LPL1 (PCI- DES), p OM1 80-90% (PCI -DES), OM2 min irregs, LPL1 small, min irregs, RCA/RPDA min irregs, RPL nl, EF 55%.  ? LIVER BIOPSY  2016  ? TRANSTHORACIC ECHOCARDIOGRAM  06/2013  ? Normal LV size and moderate thickness/moderate LVH.  Normal EF 55-60%. No RWMA. Gr 1 DD. Mlid LA dilation  ? ?Family History  ?Problem Relation Age of Onset  ? Hypertension Mother   ? Heart disease Father   ? Hypertension Father   ? Hypertension Sister   ? Stroke Sister   ? Hypertension Sister   ? Cancer Sister   ?     pancreatic  ? Heart disease Sister   ?     CHF  ? Hypertension Sister   ? Diabetes Neg Hx   ? Colon cancer Neg Hx   ? Stomach cancer Neg Hx   ? Pancreatic cancer Neg Hx   ? ?Social History  ? ?Socioeconomic History  ? Marital status: Single  ?  Spouse name: Not on file  ? Number of children: Not on file  ? Years of education: Not on file  ? Highest education level: Not on file  ?Occupational History  ? Not on file  ?Tobacco Use  ? Smoking status: Former  ?  Packs/day: 0.50  ?  Years: 25.00  ?  Pack years: 12.50  ?  Types: Cigarettes  ?  Quit date: 01/21/2016  ?  Years since quitting: 5.6  ? Smokeless  tobacco: Never  ?Vaping Use  ? Vaping Use: Never used  ?Substance and Sexual Activity  ? Alcohol use: Yes  ?  Alcohol/week: 2.0 standard drinks  ?  Types: 2 Cans of beer per week  ? Drug use: Not Currently  ?  Comment: cocain and marijuana in the past  ? Sexual activity: Yes  ?Other Topics Concern  ? Not on file  ?Social History Narrative  ? He does get somes some exercise with walking 1- 1 1/2 miles /day, currently painting cars part time.   Lives with girlfriend and  son  ? ?Social Determinants of Health  ? ?Financial Resource Strain: Low Risk   ? Difficulty of Paying Living Expenses: Not hard at all  ?Food Insecurity: No Food Insecurity  ? Worried About Charity fundraiser in the Last Year: Never true  ? Ran Out of Food in the Last Year: Never true  ?Transportation Needs: No Transportation Needs  ? Lack of Transportation (Medical): No  ? Lack of Transportation (Non-Medical): No  ?Physical Activity: Sufficiently Active  ? Days of Exercise per Week: 4 days  ? Minutes of Exercise per Session: 60 min  ?Stress: No Stress Concern Present  ? Feeling of Stress : Not at all  ?Social Connections: Not on file  ? ? ?Tobacco Counseling ?Counseling given: Not Answered ? ? ?Clinical Intake: ? ?Pre-visit preparation completed: Yes ? ?Pain : No/denies pain ? ?  ? ?Nutritional Status: BMI 25 -29 Overweight ?Nutritional Risks: None ?Diabetes: Yes ? ?How often do you need to have someone help you when you read instructions, pamphlets, or other written materials from your doctor or pharmacy?: 1 - Never ?What is the last grade level you completed in school?: 17yrcollege ? ?Diabetic? Yes ?Nutrition Risk Assessment: ? ?Has the patient had any N/V/D within the last 2 months?  No  ?Does the patient have any non-healing wounds?  No  ?Has the patient had any unintentional weight loss or weight gain?  No  ? ?Diabetes: ? ?Is the patient diabetic?  Yes  ?If diabetic, was a CBG obtained today?  No  ?Did the patient bring in their glucometer from  home?  No  ?How often do you monitor your CBG's? Does not check.  ? ?Financial Strains and Diabetes Management: ? ?Are you having any financial strains with the device, your supplies or your medication? No .  ?Does th

## 2021-09-13 NOTE — Patient Instructions (Signed)
Jay Fuller , ?Thank you for taking time to come for your Medicare Wellness Visit. I appreciate your ongoing commitment to your health goals. Please review the following plan we discussed and let me know if I can assist you in the future.  ? ?Screening recommendations/referrals: ?Colonoscopy: cologuard 07/16/2021, due 07/16/2024 ?Recommended yearly ophthalmology/optometry visit for glaucoma screening and checkup ?Recommended yearly dental visit for hygiene and checkup ? ?Vaccinations: ?Influenza vaccine: due 12/03/2021 ?Pneumococcal vaccine: completed 08/13/2020 ?Tdap vaccine: completed 06/14/2012, due 06/14/2022 ?Shingles vaccine: discussed    ?Covid-19: 08/09/2019, 07/19/2019 ? ?Advanced directives: Advance directive discussed with you today.  ? ?Conditions/risks identified: none ? ?Next appointment: Follow up in one year for your annual wellness visit.  ? ?Preventive Care 27 Years and Older, Male ?Preventive care refers to lifestyle choices and visits with your health care provider that can promote health and wellness. ?What does preventive care include? ?A yearly physical exam. This is also called an annual well check. ?Dental exams once or twice a year. ?Routine eye exams. Ask your health care provider how often you should have your eyes checked. ?Personal lifestyle choices, including: ?Daily care of your teeth and gums. ?Regular physical activity. ?Eating a healthy diet. ?Avoiding tobacco and drug use. ?Limiting alcohol use. ?Practicing safe sex. ?Taking low doses of aspirin every day. ?Taking vitamin and mineral supplements as recommended by your health care provider. ?What happens during an annual well check? ?The services and screenings done by your health care provider during your annual well check will depend on your age, overall health, lifestyle risk factors, and family history of disease. ?Counseling  ?Your health care provider may ask you questions about your: ?Alcohol use. ?Tobacco use. ?Drug use. ?Emotional  well-being. ?Home and relationship well-being. ?Sexual activity. ?Eating habits. ?History of falls. ?Memory and ability to understand (cognition). ?Work and work Statistician. ?Screening  ?You may have the following tests or measurements: ?Height, weight, and BMI. ?Blood pressure. ?Lipid and cholesterol levels. These may be checked every 5 years, or more frequently if you are over 82 years old. ?Skin check. ?Lung cancer screening. You may have this screening every year starting at age 19 if you have a 30-pack-year history of smoking and currently smoke or have quit within the past 15 years. ?Fecal occult blood test (FOBT) of the stool. You may have this test every year starting at age 45. ?Flexible sigmoidoscopy or colonoscopy. You may have a sigmoidoscopy every 5 years or a colonoscopy every 10 years starting at age 28. ?Prostate cancer screening. Recommendations will vary depending on your family history and other risks. ?Hepatitis C blood test. ?Hepatitis B blood test. ?Sexually transmitted disease (STD) testing. ?Diabetes screening. This is done by checking your blood sugar (glucose) after you have not eaten for a while (fasting). You may have this done every 1-3 years. ?Abdominal aortic aneurysm (AAA) screening. You may need this if you are a current or former smoker. ?Osteoporosis. You may be screened starting at age 44 if you are at high risk. ?Talk with your health care provider about your test results, treatment options, and if necessary, the need for more tests. ?Vaccines  ?Your health care provider may recommend certain vaccines, such as: ?Influenza vaccine. This is recommended every year. ?Tetanus, diphtheria, and acellular pertussis (Tdap, Td) vaccine. You may need a Td booster every 10 years. ?Zoster vaccine. You may need this after age 38. ?Pneumococcal 13-valent conjugate (PCV13) vaccine. One dose is recommended after age 63. ?Pneumococcal polysaccharide (PPSV23) vaccine. One dose is  recommended after  age 38. ?Talk to your health care provider about which screenings and vaccines you need and how often you need them. ?This information is not intended to replace advice given to you by your health care provider. Make sure you discuss any questions you have with your health care provider. ?Document Released: 05/18/2015 Document Revised: 01/09/2016 Document Reviewed: 02/20/2015 ?Elsevier Interactive Patient Education ? 2017 [place]. ? ?Fall Prevention in the Home ?Falls can cause injuries. They can happen to people of all ages. There are many things you can do to make your home safe and to help prevent falls. ?What can I do on the outside of my home? ?Regularly fix the edges of walkways and driveways and fix any cracks. ?Remove anything that might make you trip as you walk through a door, such as a raised step or threshold. ?Trim any bushes or trees on the path to your home. ?Use bright outdoor lighting. ?Clear any walking paths of anything that might make someone trip, such as rocks or tools. ?Regularly check to see if handrails are loose or broken. Make sure that both sides of any steps have handrails. ?Any raised decks and porches should have guardrails on the edges. ?Have any leaves, snow, or ice cleared regularly. ?Use sand or salt on walking paths during winter. ?Clean up any spills in your garage right away. This includes oil or grease spills. ?What can I do in the bathroom? ?Use night lights. ?Install grab bars by the toilet and in the tub and shower. Do not use towel bars as grab bars. ?Use non-skid mats or decals in the tub or shower. ?If you need to sit down in the shower, use a plastic, non-slip stool. ?Keep the floor dry. Clean up any water that spills on the floor as soon as it happens. ?Remove soap buildup in the tub or shower regularly. ?Attach bath mats securely with double-sided non-slip rug tape. ?Do not have throw rugs and other things on the floor that can make you trip. ?What can I do in the  bedroom? ?Use night lights. ?Make sure that you have a light by your bed that is easy to reach. ?Do not use any sheets or blankets that are too big for your bed. They should not hang down onto the floor. ?Have a firm chair that has side arms. You can use this for support while you get dressed. ?Do not have throw rugs and other things on the floor that can make you trip. ?What can I do in the kitchen? ?Clean up any spills right away. ?Avoid walking on wet floors. ?Keep items that you use a lot in easy-to-reach places. ?If you need to reach something above you, use a strong step stool that has a grab bar. ?Keep electrical cords out of the way. ?Do not use floor polish or wax that makes floors slippery. If you must use wax, use non-skid floor wax. ?Do not have throw rugs and other things on the floor that can make you trip. ?What can I do with my stairs? ?Do not leave any items on the stairs. ?Make sure that there are handrails on both sides of the stairs and use them. Fix handrails that are broken or loose. Make sure that handrails are as long as the stairways. ?Check any carpeting to make sure that it is firmly attached to the stairs. Fix any carpet that is loose or worn. ?Avoid having throw rugs at the top or bottom of the stairs. If  you do have throw rugs, attach them to the floor with carpet tape. ?Make sure that you have a light switch at the top of the stairs and the bottom of the stairs. If you do not have them, ask someone to add them for you. ?What else can I do to help prevent falls? ?Wear shoes that: ?Do not have high heels. ?Have rubber bottoms. ?Are comfortable and fit you well. ?Are closed at the toe. Do not wear sandals. ?If you use a stepladder: ?Make sure that it is fully opened. Do not climb a closed stepladder. ?Make sure that both sides of the stepladder are locked into place. ?Ask someone to hold it for you, if possible. ?Clearly mark and make sure that you can see: ?Any grab bars or  handrails. ?First and last steps. ?Where the edge of each step is. ?Use tools that help you move around (mobility aids) if they are needed. These include: ?Canes. ?Walkers. ?Scooters. ?Crutches. ?Turn on the lig

## 2021-09-18 ENCOUNTER — Other Ambulatory Visit: Payer: Self-pay

## 2021-09-18 MED ORDER — ROSUVASTATIN CALCIUM 20 MG PO TABS: 20.0000 mg | ORAL_TABLET | Freq: Every day | ORAL | 3 refills | Status: DC

## 2021-09-18 NOTE — Telephone Encounter (Signed)
[facility] is requesting pt's medication rosuvastatin be sent to their pharmacy. Please address ?

## 2021-09-25 ENCOUNTER — Telehealth: Payer: Self-pay | Admitting: Internal Medicine

## 2021-09-25 DIAGNOSIS — I10 Essential (primary) hypertension: Secondary | ICD-10-CM

## 2021-09-25 DIAGNOSIS — E785 Hyperlipidemia, unspecified: Secondary | ICD-10-CM

## 2021-09-25 DIAGNOSIS — I2510 Atherosclerotic heart disease of native coronary artery without angina pectoris: Secondary | ICD-10-CM

## 2021-09-25 MED ORDER — VASCEPA 1 G PO CAPS: 2.0000 g | ORAL_CAPSULE | Freq: Two times a day (BID) | ORAL | 0 refills | Status: DC

## 2021-09-25 MED ORDER — CLOPIDOGREL BISULFATE 75 MG PO TABS: 75.0000 mg | ORAL_TABLET | Freq: Every day | ORAL | 0 refills | Status: DC

## 2021-09-25 MED ORDER — IRBESARTAN-HYDROCHLOROTHIAZIDE 300-12.5 MG PO TABS: 1.0000 | ORAL_TABLET | Freq: Every day | ORAL | 0 refills | Status: DC

## 2021-09-25 MED ORDER — AMLODIPINE BESYLATE 10 MG PO TABS: 10.0000 mg | ORAL_TABLET | Freq: Every day | ORAL | 0 refills | Status: DC

## 2021-09-25 MED ORDER — ISOSORBIDE MONONITRATE ER 30 MG PO TB24: 30.0000 mg | ORAL_TABLET | Freq: Every day | ORAL | 0 refills | Status: DC

## 2021-09-25 MED ORDER — CARVEDILOL 25 MG PO TABS: ORAL_TABLET | ORAL | 0 refills | Status: DC

## 2021-09-25 NOTE — Telephone Encounter (Signed)
Jay Fuller got a request for refills forms on medication. Pt has an appt coming up end of June so I went ahead and refilled med for 90 days to mail order to get him to his appt

## 2021-09-26 ENCOUNTER — Telehealth: Payer: Self-pay

## 2021-09-26 DIAGNOSIS — I10 Essential (primary) hypertension: Secondary | ICD-10-CM

## 2021-09-26 NOTE — Telephone Encounter (Signed)
Received call from [facility] for a refill on his Carvedilol last apt was 09/13/21.

## 2021-09-27 MED ORDER — CARVEDILOL 25 MG PO TABS: ORAL_TABLET | ORAL | 0 refills | Status: DC

## 2021-09-27 NOTE — Telephone Encounter (Signed)
Sent in

## 2021-10-04 ENCOUNTER — Ambulatory Visit: Payer: Medicare Other | Admitting: Medical

## 2021-10-10 ENCOUNTER — Other Ambulatory Visit: Payer: Self-pay | Admitting: Medical

## 2021-10-24 ENCOUNTER — Encounter: Payer: Self-pay | Admitting: Medical

## 2021-10-24 ENCOUNTER — Ambulatory Visit (INDEPENDENT_AMBULATORY_CARE_PROVIDER_SITE_OTHER): Payer: Medicare Other | Admitting: Medical

## 2021-10-24 VITALS — BP 140/80 | HR 64 | Wt 196.0 lb

## 2021-10-24 DIAGNOSIS — E785 Hyperlipidemia, unspecified: Secondary | ICD-10-CM | POA: Diagnosis not present

## 2021-10-24 DIAGNOSIS — Z0000 Encounter for general adult medical examination without abnormal findings: Secondary | ICD-10-CM | POA: Insufficient documentation

## 2021-10-24 DIAGNOSIS — I252 Old myocardial infarction: Secondary | ICD-10-CM | POA: Diagnosis not present

## 2021-10-24 DIAGNOSIS — G8929 Other chronic pain: Secondary | ICD-10-CM

## 2021-10-24 DIAGNOSIS — E118 Type 2 diabetes mellitus with unspecified complications: Secondary | ICD-10-CM

## 2021-10-24 DIAGNOSIS — I10 Essential (primary) hypertension: Secondary | ICD-10-CM

## 2021-10-24 DIAGNOSIS — K08109 Complete loss of teeth, unspecified cause, unspecified class: Secondary | ICD-10-CM

## 2021-10-24 DIAGNOSIS — Z7185 Encounter for immunization safety counseling: Secondary | ICD-10-CM

## 2021-10-24 DIAGNOSIS — I2510 Atherosclerotic heart disease of native coronary artery without angina pectoris: Secondary | ICD-10-CM

## 2021-10-24 DIAGNOSIS — Z87891 Personal history of nicotine dependence: Secondary | ICD-10-CM

## 2021-10-24 DIAGNOSIS — K7460 Unspecified cirrhosis of liver: Secondary | ICD-10-CM

## 2021-10-24 DIAGNOSIS — Z8619 Personal history of other infectious and parasitic diseases: Secondary | ICD-10-CM

## 2021-10-24 DIAGNOSIS — R942 Abnormal results of pulmonary function studies: Secondary | ICD-10-CM

## 2021-10-24 DIAGNOSIS — Z125 Encounter for screening for malignant neoplasm of prostate: Secondary | ICD-10-CM

## 2021-10-24 DIAGNOSIS — Z9861 Coronary angioplasty status: Secondary | ICD-10-CM

## 2021-10-24 DIAGNOSIS — Z136 Encounter for screening for cardiovascular disorders: Secondary | ICD-10-CM | POA: Insufficient documentation

## 2021-10-24 DIAGNOSIS — R918 Other nonspecific abnormal finding of lung field: Secondary | ICD-10-CM | POA: Insufficient documentation

## 2021-10-24 DIAGNOSIS — K219 Gastro-esophageal reflux disease without esophagitis: Secondary | ICD-10-CM

## 2021-10-24 DIAGNOSIS — Z23 Encounter for immunization: Secondary | ICD-10-CM | POA: Diagnosis not present

## 2021-10-24 DIAGNOSIS — M25559 Pain in unspecified hip: Secondary | ICD-10-CM

## 2021-10-24 DIAGNOSIS — R0600 Dyspnea, unspecified: Secondary | ICD-10-CM

## 2021-10-24 DIAGNOSIS — H269 Unspecified cataract: Secondary | ICD-10-CM | POA: Insufficient documentation

## 2021-10-24 DIAGNOSIS — Z972 Presence of dental prosthetic device (complete) (partial): Secondary | ICD-10-CM

## 2021-10-24 NOTE — Assessment & Plan Note (Signed)
Continue yearly routine follow-up with cardiology

## 2021-10-24 NOTE — Assessment & Plan Note (Signed)
Continue your current blood pressure medications.  Check your blood pressures at home periodically.  Goal is less than 130/80.  Today is okay but some of your numbers in the past have been elevated.  I did review your recent cardiology consult note.

## 2021-10-24 NOTE — Patient Instructions (Addendum)
This visit was a preventative care visit, also known as wellness visit or routine physical.   Topics typically include healthy lifestyle, diet, exercise, preventative care, vaccinations, sick and well care, proper use of emergency dept and after hours care, as well as other concerns.     Recommendations: Continue to return yearly for your annual wellness and preventative care visits.  This gives [place] a chance to discuss healthy lifestyle, exercise, vaccinations, review your chart record, and perform screenings where appropriate.  I recommend you see your eye doctor yearly for routine vision care.  I recommend you see your dentist yearly for routine dental care including hygiene visits twice yearly.   Vaccination recommendations were reviewed Immunization History  Administered Date(s) Administered   Hepatitis A, Adult 07/22/2013, 07/25/2015   Hepatitis B 02/06/1998, 03/12/1998, 07/19/1998   Influenza,inj,Quad PF,6+ Mos 06/24/2013, 01/22/2015, 01/20/2017, 02/24/2018   PFIZER(Purple Top)SARS-COV-2 Vaccination 07/19/2019, 08/09/2019   Pneumococcal Conjugate-13 08/13/2020   Pneumococcal Polysaccharide-23 06/24/2013   Tdap 06/14/2012   Counseled on the pneumococcal vaccine.  Vaccine information sheet given.  Pneumococcal vaccine Prevnar 20 given after consent obtained.  Shingles vaccine:  I recommend you have a shingles vaccine to help prevent shingles or herpes zoster outbreak.   Please call your insurer to inquire about coverage for the Shingrix vaccine given in 2 doses.   Some insurers cover this vaccine after age 95, some cover this after age 49.  If your insurer covers this, then call to schedule appointment to have this vaccine here.   Screening for cancer: Colon cancer screening: I reviewed your Cologuard stool test which was negative in March 2023.  This will be need to repeat in 3 years  Prostate cancer screening-PSA blood test done today.  I recommend doing this yearly up until age  42  Skin cancer screening: Check your skin regularly for new changes, growing lesions, or other lesions of concern Come in for evaluation if you have skin lesions of concern.  Lung cancer screening: If you have a greater than 20 pack year history of tobacco use, then you may qualify for lung cancer screening with a chest CT scan.   Please call your insurance company to inquire about coverage for this test.  We currently don't have screenings for other cancers besides breast, cervical, colon, and lung cancers.  If you have a strong family history of cancer or have other cancer screening concerns, please let me know.    Bone health: Get at least 150 minutes of aerobic exercise weekly Get weight bearing exercise at least once weekly Bone density test:  A bone density test is an imaging test that uses a type of X-ray to measure the amount of calcium and other minerals in your bones. The test may be used to diagnose or screen you for a condition that causes weak or thin bones (osteoporosis), predict your risk for a broken bone (fracture), or determine how well your osteoporosis treatment is working. The bone density test is recommended for females 40 and older, or females or males <76 if certain risk factors such as thyroid disease, long term use of steroids such as for asthma or rheumatological issues, vitamin D deficiency, estrogen deficiency, family history of osteoporosis, self or family history of fragility fracture in first degree relative.    Heart health: Get at least 150 minutes of aerobic exercise weekly Limit alcohol It is important to maintain a healthy blood pressure and healthy cholesterol numbers  Heart disease screening: Screening for heart disease includes  screening for blood pressure, fasting lipids, glucose/diabetes screening, BMI height to weight ratio, reviewed of smoking status, physical activity, and diet.    Goals include blood pressure 120/80 or less, maintaining a  healthy lipid/cholesterol profile, preventing diabetes or keeping diabetes numbers under good control, not smoking or using tobacco products, exercising most days per week or at least 150 minutes per week of exercise, and eating healthy variety of fruits and vegetables, healthy oils, and avoiding unhealthy food choices like fried food, fast food, high sugar and high cholesterol foods.     Medical care options: I recommend you continue to seek care here first for routine care.  We try really hard to have available appointments Monday through Friday daytime hours for sick visits, acute visits, and physicals.  Urgent care should be used for after hours and weekends for significant issues that cannot wait till the next day.  The emergency department should be used for significant potentially life-threatening emergencies.  The emergency department is expensive, can often have long wait times for less significant concerns, so try to utilize primary care, urgent care, or telemedicine when possible to avoid unnecessary trips to the emergency department.  Virtual visits and telemedicine have been introduced since the pandemic started in 2020, and can be convenient ways to receive medical care.  We offer virtual appointments as well to assist you in a variety of options to seek medical care.   Advanced Directives: I recommend you consider completing a [place] and Living Will.   These documents respect your wishes and help alleviate burdens on your loved ones if you were to become terminally ill or be in a position to need those documents enforced.    You can complete Advanced Directives yourself, have them notarized, then have copies made for our office, for you and for anybody you feel should have them in safe keeping.  Or, you can have an attorney prepare these documents.   If you haven't updated your Last Will and Testament in a while, it may be worthwhile having an attorney prepare these  documents together and save on some costs.        Problem List Items Addressed This Visit     Essential hypertension (Chronic)    Continue your current blood pressure medications.  Check your blood pressures at home periodically.  Goal is less than 130/80.  Today is okay but some of your numbers in the past have been elevated.  I did review your recent cardiology consult note.      CAD S/P percutaneous coronary angioplasty (Chronic)   Relevant Orders   CT Chest W Contrast   Hyperlipidemia with target low density lipoprotein (LDL) cholesterol less than 70 mg/dL (Chronic)    Continue statin cholesterol medication rosuvastatin 20 mg daily and prescription fish oil, healthy low-cholesterol diet. Recommendations for improving lipids:  Foods TO AVOID or limit - fried foods, high sugar foods, white bread, enriched flour, fast food, red meat, large amounts of cheese, processed foods such as little debbie cakes, cookies, pies, donuts, for example  Foods TO INCLUDE in the diet - whole grains such as whole grain pasta, whole grain bread, barley, steel cut oatmeal (not instant oatmeal), avocado, fish, green leafy vegetables, nuts, increased fiber in diet, and using olive oil in small amounts for cooking or as salad dressing vinaigrette.        Relevant Orders   Lipid panel   History of non-ST elevation myocardial infarction (NSTEMI)  Continue yearly routine follow-up with cardiology      Cirrhosis of liver without ascites ([place])   Relevant Orders   AFP tumor marker   CT Chest W Contrast   Ambulatory referral to Gastroenterology   Former smoker   Relevant Orders   AFP tumor marker   CT Chest W Contrast   Spirometry with graph (Completed)   History of hepatitis C   Relevant Orders   Comprehensive metabolic panel   CBC with Differential/Platelet   PT and PTT   Ambulatory referral to Gastroenterology   Abnormal PFT   Relevant Orders   CT Chest W Contrast   Gastroesophageal reflux  disease   Full dentures   Hip pain, chronic, unspecified laterality    Having a history of hepatitis C and liver cirrhosis puts you at risk for liver abnormal function over time.  You should be seeing a liver specialist yearly.  You likely need an ultrasound of your liver yearly.  I recommend follow-up with the liver clinic now.  There are also blood test that are done yearly to monitor your liver.  Abstain from alcohol.  Limit Tylenol and other pain medicines over-the-counter.  In 2022 your viral load showed no active virus thankfully.  The last liver clinic visit that I am aware you saw was either [facility] or [facility].  Please find the phone number you have at home for which when you saw last and make a follow-up appointment.  Please make sure they send [place] a copy of your office notes from that visit.      Type 2 diabetes mellitus with complication, without long-term current use of insulin ([place])    Pending labs I may ask you start checking your blood sugars fasting a few mornings per week.  The goal is fasting blood sugar between 80-130.  You should check your feet for sores or wounds daily and let us know if you find something unusual.  See your eye doctor yearly for exam and make sure they check the back of your eye for diabetic eye exam.        Relevant Orders   Comprehensive metabolic panel   Hemoglobin A1c   Microalbumin/Creatinine Ratio, Urine   Encounter for health maintenance examination in adult - Primary   Relevant Orders   Comprehensive metabolic panel   CBC with Differential/Platelet   PT and PTT   Lipid panel   Hemoglobin A1c   Microalbumin/Creatinine Ratio, Urine   Urinalysis   AFP tumor marker   CT Chest W Contrast   PSA   Spirometry with graph (Completed)   Abnormal x-ray of lung    Given your history of smoking, prior abnormal breathing test and prior abnormal chest x-ray I am scheduling you for a CT chest to further evaluate for any  type of lung cancer or abnormal lung findings.      Relevant Orders   CT Chest W Contrast   Screening for prostate cancer   Relevant Orders   PSA   Vaccine counseling   Cataract    Follow up with eye doctor as planned      Dyspnea    A spirometry test was performed today to check your lung volumes.  Prior test similar a few years ago has been abnormal.  Given your difficulty breathing sometimes with exercise I suspect underlying lung disease related to prior smoking.  I recommend trial of daily preventative inhaler particular on the days you exercise to  see if you notice a difference in your exercise tolerance and breathing.      Relevant Orders   Spirometry with graph (Completed)   Encounter for screening for vascular disease    Given history of diabetes, liver disease, heart disease, it is recommended to have a baseline ABI blood flow test in your legs to screen for vascular disease.   If agreeable, I'll place and order for this.

## 2021-10-24 NOTE — Assessment & Plan Note (Signed)
Given your history of smoking, prior abnormal breathing test and prior abnormal chest x-ray I am scheduling you for a CT chest to further evaluate for any type of lung cancer or abnormal lung findings.

## 2021-10-24 NOTE — Assessment & Plan Note (Signed)
Having a history of hepatitis C and liver cirrhosis puts you at risk for liver abnormal function over time.  You should be seeing a liver specialist yearly.  You likely need an ultrasound of your liver yearly.  I recommend follow-up with the liver clinic now.  There are also blood test that are done yearly to monitor your liver.  Abstain from alcohol.  Limit Tylenol and other pain medicines over-the-counter.  In 2022 your viral load showed no active virus thankfully.  The last liver clinic visit that I am aware you saw was either [facility] or [facility].  Please find the phone number you have at home for which when you saw last and make a follow-up appointment.  Please make sure they send [place] a copy of your office notes from that visit.

## 2021-10-24 NOTE — Assessment & Plan Note (Addendum)
Follow up with eye doctor as planned

## 2021-10-24 NOTE — Progress Notes (Signed)
Subjective:    Jay Fuller is a 66 y.o. male who presents for Preventative Services visit and chronic medical problems/med check visit.    Primary Care Provider Annalysa Mohammad, Camelia Eng, PA-C here for primary care  Current Health Care Team: Dentist, pt cant remember the name Eye doctor, Eye center in [place], [place] with Dr. [name], cardiology Dr. Jule Ser, infectious disease Prior with Dr. Bjorn Pippin and Cindie Crumbly, PA with liver specialists Dr. Lucio Edward, [place] you may have received from other than [facility] providers in the past year (date may be approximate) cardiology  Exercise Current exercise habits: Gym/ health club routine includes 2-3 days per week. Walks, calisthenics.  Nutrition/Diet Current diet:  relatively healthy  Depression Screen    10/24/2021    9:42 AM  Depression screen PHQ 2/9  Decreased Interest 0  Down, Depressed, Hopeless 0  PHQ - 2 Score 0  Altered sleeping 0  Tired, decreased energy 0  Change in appetite 0  Feeling bad or failure about yourself  0  Trouble concentrating 0  Moving slowly or fidgety/restless 0  Suicidal thoughts 0  PHQ-9 Score 0  Difficult doing work/chores Not difficult at all    Activities of Daily Living Screen/Functional Status Survey Is the patient deaf or have difficulty hearing?: No Does the patient have difficulty seeing, even when wearing glasses/contacts?: No Does the patient have difficulty concentrating, remembering, or making decisions?: Yes (Memory) Does the patient have difficulty walking or climbing stairs?: No Does the patient have difficulty dressing or bathing?: No Does the patient have difficulty doing errands alone such as visiting a doctor's office or shopping?: No  Can patient draw a clock face showing 3:15 oclock, yes  Fall Risk Screen    10/24/2021    9:41 AM 09/13/2021    3:32 PM 12/17/2020    2:00 PM 08/29/2020    2:56 PM 08/13/2020    9:33 AM   [place] in the past year? 0 1 0 0 0  Comment  lost balance     Number falls in past yr: 1 1 0  0  Injury with Fall? 0 0 0  0  Risk for fall due to : No Fall Risks Medication side effect   No Fall Risks  Follow up Falls evaluation completed Falls evaluation completed;Education provided;Falls prevention discussed  Falls evaluation completed Falls evaluation completed    Gait Assessment: Normal gait observed yes  Advanced directives Does patient have a [place]? No Does patient have a Living Will? No  Past Medical History:  Diagnosis Date   CAD S/P percutaneous coronary angioplasty 06/2013   a. NSTEMI/Cath/PCI: LM nl, dLAD 20-40%, Diags small with ~95%; pLCx 20%, dLCx 99% * (DES PCI - 2.25x16 Promus Premier DES) & pOM1 80-90% (DES PCI: 2.5x20 Promus Premier DES), OM2 min irregs, LPL1 small, min irregs, RCA/RPDA min irregs, RPL nl, EF 55%.   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  Normal wall motion.;    Hyperlipidemia    Hypertension    Polysubstance abuse (HCC)    history of cocaine, marijuana, tobacco abuse in the use   Wears glasses  Past Surgical History:  Procedure Laterality Date   COLONOSCOPY     denied, but Cologuard negative 12/2013   CORONARY ANGIOPLASTY WITH STENT PLACEMENT  06/2013    dLCx 99% going into LPL1 (DES PCI: 2.25x16 Promus Premier DES), p OM1 80-90% (DES PCI - 2.5x20 Promus Premier DES)   LEFT HEART CATH AND CORONARY ANGIOGRAPHY N/A 07/18/2019   Procedure: LEFT HEART CATH AND CORONARY ANGIOGRAPHY;  Surgeon: Leonie Man, MD;  Location: [facility];  Service: Cardiovascular;; Two-vessel CAD with widely patent stents in OM1 and distal LCx-OM 3.  Otherwise minimal disease. No obvious culprit lesion. Normal EF and EDP.    LEFT HEART CATHETERIZATION WITH CORONARY ANGIOGRAM N/A 06/23/2013   Procedure: LEFT HEART CATHETERIZATION WITH CORONARY ANGIOGRAM;  Surgeon: Leonie Man, MD;  Location: [facility] CATH LAB:: LM nl, d LAD 20-40%, small caliber Diags w/ severel ~95% lesions, p LCX 20%, dLCx 99% going into LPL1 (PCI- DES), p OM1 80-90% (PCI -DES), OM2 min irregs, LPL1 small, min irregs, RCA/RPDA min irregs, RPL nl, EF 55%.   LIVER BIOPSY  2016   TRANSTHORACIC ECHOCARDIOGRAM  06/2013   Normal LV size and moderate thickness/moderate LVH.  Normal EF 55-60%. No RWMA. Gr 1 DD. Mlid LA dilation    Social History   Socioeconomic History   Marital status: Single    Spouse name: Not on file   Number of children: Not on file   Years of education: Not on file   Highest education level: Not on file  Occupational History   Not on file  Tobacco Use   Smoking status: Former    Packs/day: 0.50    Years: 25.00    Total pack years: 12.50    Types: Cigarettes    Quit date: 01/21/2016    Years since quitting: 5.7   Smokeless tobacco: Never  Vaping Use   Vaping Use: Never used  Substance and Sexual Activity   Alcohol use: Yes    Alcohol/week: 2.0 standard drinks of alcohol    Types: 2 Cans of beer per week   Drug use: Not Currently    Comment: cocain and marijuana in the past   Sexual activity: Yes  Other Topics Concern   Not on file  Social History Narrative   Lives with girlfriend and son   Social Determinants of Health   Financial Resource Strain: Low Risk  (09/13/2021)   Overall Financial Resource Strain (CARDIA)    Difficulty of Paying Living Expenses: Not hard at all  Food Insecurity: No Food Insecurity (09/13/2021)   Hunger Vital Sign    Worried About Running Out of Food in the Last Year: Never true    Ran Out of Food in the Last Year: Never true  Transportation Needs: No Transportation Needs (09/13/2021)   PRAPARE - Hydrologist (Medical): No    Lack of Transportation  (Non-Medical): No  Physical Activity: Sufficiently Active (09/13/2021)   Exercise Vital Sign    Days of Exercise per Week: 4 days    Minutes of Exercise per Session: 60 min  Stress: No Stress Concern Present (09/13/2021)   [place]    Feeling of Stress : Not at all  Social Connections: Not on file  Intimate Partner Violence: Not on file    Family History  Problem Relation Age of Onset   Hypertension Mother    Heart disease Father    Hypertension Father  Hypertension Sister    Stroke Sister    Hypertension Sister    Cancer Sister        pancreatic   Heart disease Sister        CHF   Hypertension Sister    Diabetes Neg Hx    Colon cancer Neg Hx    Stomach cancer Neg Hx    Pancreatic cancer Neg Hx      Current Outpatient Medications:    amLODipine (NORVASC) 10 MG tablet, Take 1 tablet (10 mg total) by mouth daily., Disp: 90 tablet, Rfl: 0   carvedilol (COREG) 25 MG tablet, Take one half tablet of 25 mg ( 12.5 mg) in the morning and  25 mg tablet  in the evening., Disp: 180 tablet, Rfl: 0   clopidogrel (PLAVIX) 75 MG tablet, Take 1 tablet (75 mg total) by mouth daily., Disp: 90 tablet, Rfl: 0   diphenhydrAMINE (SOMINEX) 25 MG tablet, Take 25 mg by mouth at bedtime., Disp: , Rfl:    irbesartan-hydrochlorothiazide (AVALIDE) 300-12.5 MG tablet, Take 1 tablet by mouth daily., Disp: 90 tablet, Rfl: 0   isosorbide mononitrate (IMDUR) 30 MG 24 hr tablet, Take 1 tablet (30 mg total) by mouth daily., Disp: 90 tablet, Rfl: 0   nitroGLYCERIN (NITROSTAT) 0.4 MG SL tablet, PLACE 1 TABLET UNDER THE TONGUE EVERY 5 MINURES FOR 3 DOSES AS NEEDED FOR CHEST PAIN, Disp: 30 tablet, Rfl: 0   rosuvastatin (CRESTOR) 20 MG tablet, Take 1 tablet (20 mg total) by mouth daily., Disp: 90 tablet, Rfl: 3   VASCEPA 1 g capsule, Take 2 capsules (2 g total) by mouth 2 (two) times daily., Disp: 360 capsule, Rfl: 0  No Known Allergies  History  reviewed: allergies, current medications, past family history, past medical history, past social history, past surgical history and problem list  Chronic issues discussed: Hypertension-compliant with medication without complaint  Hyperlipidemia-compliant medication no complaint  History of cirrhosis and liver disease-he has not been back to see his liver doctor in a while probably a few years  Former smoker  Acute issues discussed: He is get some shortness of breath with exercise and on occasion  He does exercise regularly.  He gets occasional chest pain but he has discussed this with his cardiologist.  Objective:    Biometrics BP 140/80   Pulse 64   Wt 196 lb (88.9 kg)   SpO2 97%   BMI 30.70 kg/m   Wt Readings from Last 3 Encounters:  10/24/21 196 lb (88.9 kg)  09/13/21 190 lb (86.2 kg)  02/28/21 193 lb 6.4 oz (87.7 kg)   BP Readings from Last 3 Encounters:  10/24/21 140/80  02/28/21 (!) 150/78  12/17/20 120/70   Gen: wd, wn, nad, black male Skin- unremarkable HEENT: normocephalic, sclerae anicteric, TMs pearly, nares patent, no discharge or erythema, pharynx normal Oral cavity: MMM, full dentures, no lesions Neck: supple, no lymphadenopathy, no thyromegaly, no masses, no bruits Heart: RRR, normal S1, S2, no murmurs Lungs: CTA bilaterally, no wheezes, rhonchi, or rales Abdomen: +bs, soft, non tender, non distended, no masses, no hepatomegaly, no splenomegaly, no bruits Musculoskeletal: nontender, no swelling, no obvious deformity Extremities: no edema, no cyanosis, no clubbing Pulses: 2+ symmetric, upper and lower extremities, normal cap refill Neurological: alert, oriented x 3, CN2-12 intact, strength normal upper extremities and lower extremities, sensation normal throughout, DTRs 2+ throughout, no cerebellar signs, gait normal Psychiatric: normal affect, behavior normal, pleasant  GU/rectal - deferred  Diabetic Foot Exam - Simple  Simple Foot Form Diabetic  Foot exam was performed with the following findings: Yes 10/24/2021 10:22 AM  Visual Inspection See comments: Yes Sensation Testing Intact to touch and monofilament testing bilaterally: Yes Pulse Check Posterior Tibialis and Dorsalis pulse intact bilaterally: Yes Comments Thickened toenails particularly great toenails but other toenails as well, brownish color of the toenails, otherwise no foot lesions      Assessment:   Encounter Diagnoses  Name Primary?   Encounter for health maintenance examination in adult    Type 2 diabetes mellitus with complication, without long-term current use of insulin (HCC)    Hyperlipidemia with target low density lipoprotein (LDL) cholesterol less than 70 mg/dL    History of hepatitis C    History of non-ST elevation myocardial infarction (NSTEMI)    Gastroesophageal reflux disease, unspecified whether esophagitis present    Full dentures    Former smoker    Essential hypertension    Cirrhosis of liver without ascites, unspecified hepatic cirrhosis type ([place])    CAD S/P percutaneous coronary angioplasty    Abnormal PFT    Abnormal x-ray of lung    Screening for prostate cancer    Vaccine counseling    Cataract, unspecified cataract type, unspecified laterality    Dyspnea, unspecified type    Hip pain, chronic, unspecified laterality    Encounter for screening for vascular disease    Need for pneumococcal vaccination Yes     Plan:   This visit was a preventative care visit, also known as wellness visit or routine physical.   Topics typically include healthy lifestyle, diet, exercise, preventative care, vaccinations, sick and well care, proper use of emergency dept and after hours care, as well as other concerns.     Recommendations: Continue to return yearly for your annual wellness and preventative care visits.  This gives [place] a chance to discuss healthy lifestyle, exercise, vaccinations, review your chart record, and perform screenings where  appropriate.  I recommend you see your eye doctor yearly for routine vision care.  I recommend you see your dentist yearly for routine dental care including hygiene visits twice yearly.   Vaccination recommendations were reviewed Immunization History  Administered Date(s) Administered   Hepatitis A, Adult 07/22/2013, 07/25/2015   Hepatitis B 02/06/1998, 03/12/1998, 07/19/1998   Influenza,inj,Quad PF,6+ Mos 06/24/2013, 01/22/2015, 01/20/2017, 02/24/2018   PFIZER(Purple Top)SARS-COV-2 Vaccination 07/19/2019, 08/09/2019   PNEUMOCOCCAL CONJUGATE-20 10/24/2021   Pneumococcal Conjugate-13 08/13/2020   Pneumococcal Polysaccharide-23 06/24/2013   Tdap 06/14/2012   Counseled on the pneumococcal vaccine.  Vaccine information sheet given.  Pneumococcal vaccine Prevnar 20 given after consent obtained.  Shingles vaccine:  I recommend you have a shingles vaccine to help prevent shingles or herpes zoster outbreak.   Please call your insurer to inquire about coverage for the Shingrix vaccine given in 2 doses.   Some insurers cover this vaccine after age 30, some cover this after age 81.  If your insurer covers this, then call to schedule appointment to have this vaccine here.   Screening for cancer: Colon cancer screening: I reviewed your Cologuard stool test which was negative in March 2023.  This will be need to repeat in 3 years  Prostate cancer screening-PSA blood test done today.  I recommend doing this yearly up until age 5  Skin cancer screening: Check your skin regularly for new changes, growing lesions, or other lesions of concern Come in for evaluation if you have skin lesions of concern.  Lung cancer screening: If you have a  greater than 20 pack year history of tobacco use, then you may qualify for lung cancer screening with a chest CT scan.   Please call your insurance company to inquire about coverage for this test.  We currently don't have screenings for other cancers besides  breast, cervical, colon, and lung cancers.  If you have a strong family history of cancer or have other cancer screening concerns, please let me know.    Bone health: Get at least 150 minutes of aerobic exercise weekly Get weight bearing exercise at least once weekly Bone density test:  A bone density test is an imaging test that uses a type of X-ray to measure the amount of calcium and other minerals in your bones. The test may be used to diagnose or screen you for a condition that causes weak or thin bones (osteoporosis), predict your risk for a broken bone (fracture), or determine how well your osteoporosis treatment is working. The bone density test is recommended for females 87 and older, or females or males <09 if certain risk factors such as thyroid disease, long term use of steroids such as for asthma or rheumatological issues, vitamin D deficiency, estrogen deficiency, family history of osteoporosis, self or family history of fragility fracture in first degree relative.    Heart health: Get at least 150 minutes of aerobic exercise weekly Limit alcohol It is important to maintain a healthy blood pressure and healthy cholesterol numbers  Heart disease screening: Screening for heart disease includes screening for blood pressure, fasting lipids, glucose/diabetes screening, BMI height to weight ratio, reviewed of smoking status, physical activity, and diet.    Goals include blood pressure 120/80 or less, maintaining a healthy lipid/cholesterol profile, preventing diabetes or keeping diabetes numbers under good control, not smoking or using tobacco products, exercising most days per week or at least 150 minutes per week of exercise, and eating healthy variety of fruits and vegetables, healthy oils, and avoiding unhealthy food choices like fried food, fast food, high sugar and high cholesterol foods.     Medical care options: I recommend you continue to seek care here first for routine  care.  We try really hard to have available appointments Monday through Friday daytime hours for sick visits, acute visits, and physicals.  Urgent care should be used for after hours and weekends for significant issues that cannot wait till the next day.  The emergency department should be used for significant potentially life-threatening emergencies.  The emergency department is expensive, can often have long wait times for less significant concerns, so try to utilize primary care, urgent care, or telemedicine when possible to avoid unnecessary trips to the emergency department.  Virtual visits and telemedicine have been introduced since the pandemic started in 2020, and can be convenient ways to receive medical care.  We offer virtual appointments as well to assist you in a variety of options to seek medical care.   Advanced Directives: I recommend you consider completing a [place] and Living Will.   These documents respect your wishes and help alleviate burdens on your loved ones if you were to become terminally ill or be in a position to need those documents enforced.    You can complete Advanced Directives yourself, have them notarized, then have copies made for our office, for you and for anybody you feel should have them in safe keeping.  Or, you can have an attorney prepare these documents.   If you haven't updated your Last Will  and Testament in a while, it may be worthwhile having an attorney prepare these documents together and save on some costs.       Separate significant issues discussed:  Problem List Items Addressed This Visit     Essential hypertension (Chronic)    Continue your current blood pressure medications.  Check your blood pressures at home periodically.  Goal is less than 130/80.  Today is okay but some of your numbers in the past have been elevated.  I did review your recent cardiology consult note.      CAD S/P percutaneous coronary angioplasty  (Chronic)   Relevant Orders   CT Chest W Contrast   Hyperlipidemia with target low density lipoprotein (LDL) cholesterol less than 70 mg/dL (Chronic)    Continue statin cholesterol medication rosuvastatin 20 mg daily and prescription fish oil, healthy low-cholesterol diet. Recommendations for improving lipids:  Foods TO AVOID or limit - fried foods, high sugar foods, white bread, enriched flour, fast food, red meat, large amounts of cheese, processed foods such as little debbie cakes, cookies, pies, donuts, for example  Foods TO INCLUDE in the diet - whole grains such as whole grain pasta, whole grain bread, barley, steel cut oatmeal (not instant oatmeal), avocado, fish, green leafy vegetables, nuts, increased fiber in diet, and using olive oil in small amounts for cooking or as salad dressing vinaigrette.        Relevant Orders   Lipid panel   History of non-ST elevation myocardial infarction (NSTEMI)    Continue yearly routine follow-up with cardiology      Cirrhosis of liver without ascites ([place])   Relevant Orders   AFP tumor marker   CT Chest W Contrast   Ambulatory referral to Gastroenterology   Former smoker   Relevant Orders   AFP tumor marker   CT Chest W Contrast   Spirometry with graph (Completed)   History of hepatitis C   Relevant Orders   Comprehensive metabolic panel   CBC with Differential/Platelet   PT and PTT   Ambulatory referral to Gastroenterology   Abnormal PFT   Relevant Orders   CT Chest W Contrast   Gastroesophageal reflux disease   Full dentures   Hip pain, chronic, unspecified laterality    Having a history of hepatitis C and liver cirrhosis puts you at risk for liver abnormal function over time.  You should be seeing a liver specialist yearly.  You likely need an ultrasound of your liver yearly.  I recommend follow-up with the liver clinic now.  There are also blood test that are done yearly to monitor your liver.  Abstain from alcohol.  Limit  Tylenol and other pain medicines over-the-counter.  In 2022 your viral load showed no active virus thankfully.  The last liver clinic visit that I am aware you saw was either [facility] or [facility].  Please find the phone number you have at home for which when you saw last and make a follow-up appointment.  Please make sure they send [place] a copy of your office notes from that visit.      Type 2 diabetes mellitus with complication, without long-term current use of insulin ([place])    Pending labs I may ask you start checking your blood sugars fasting a few mornings per week.  The goal is fasting blood sugar between 80-130.  You should check your feet for sores or wounds daily and let us know if you find something unusual.  See your eye doctor yearly for exam and make sure they check the back of your eye for diabetic eye exam.        Relevant Orders   Comprehensive metabolic panel   Hemoglobin A1c   Microalbumin/Creatinine Ratio, Urine   Encounter for health maintenance examination in adult   Relevant Orders   Comprehensive metabolic panel   CBC with Differential/Platelet   PT and PTT   Lipid panel   Hemoglobin A1c   Microalbumin/Creatinine Ratio, Urine   Urinalysis   AFP tumor marker   CT Chest W Contrast   PSA   Spirometry with graph (Completed)   Abnormal x-ray of lung    Given your history of smoking, prior abnormal breathing test and prior abnormal chest x-ray I am scheduling you for a CT chest to further evaluate for any type of lung cancer or abnormal lung findings.      Relevant Orders   CT Chest W Contrast   Screening for prostate cancer   Relevant Orders   PSA   Vaccine counseling   Cataract    Follow up with eye doctor as planned      Dyspnea    A spirometry test was performed today to check your lung volumes.  Prior test similar a few years ago has been abnormal.  Given your difficulty breathing sometimes with exercise I suspect underlying  lung disease related to prior smoking.  I recommend trial of daily preventative inhaler particular on the days you exercise to see if you notice a difference in your exercise tolerance and breathing.      Relevant Orders   Spirometry with graph (Completed)   Encounter for screening for vascular disease    Given history of diabetes, liver disease, heart disease, it is recommended to have a baseline ABI blood flow test in your legs to screen for vascular disease.   If agreeable, I'll place and order for this.       Other Visit Diagnoses     Need for pneumococcal vaccination    -  Primary   Relevant Orders   Pneumococcal conjugate vaccine 20-valent (Completed)         Medicare Attestation A preventative services visit was completed today.  During the course of the visit the patient was educated and counseled about appropriate screening and preventive services.  A health risk assessment was established with the patient that included a review of current medications, allergies, social history, family history, medical and preventative health history, biometrics, and preventative screenings to identify potential safety concerns or impairments.  A personalized plan was printed today for the patient's records and use.   Personalized health advice and education was given today to reduce health risks and promote self management and wellness.  Information regarding end of life planning was discussed today.  Dorothea Ogle, PA-C   10/24/2021

## 2021-10-24 NOTE — Assessment & Plan Note (Signed)
Given history of diabetes, liver disease, heart disease, it is recommended to have a baseline ABI blood flow test in your legs to screen for vascular disease.   If agreeable, I'll place and order for this.

## 2021-10-24 NOTE — Assessment & Plan Note (Addendum)
Continue statin cholesterol medication rosuvastatin 20 mg daily and prescription fish oil, healthy low-cholesterol diet. Recommendations for improving lipids:  Foods TO AVOID or limit - fried foods, high sugar foods, white bread, enriched flour, fast food, red meat, large amounts of cheese, processed foods such as little debbie cakes, cookies, pies, donuts, for example  Foods TO INCLUDE in the diet - whole grains such as whole grain pasta, whole grain bread, barley, steel cut oatmeal (not instant oatmeal), avocado, fish, green leafy vegetables, nuts, increased fiber in diet, and using olive oil in small amounts for cooking or as salad dressing vinaigrette.

## 2021-10-24 NOTE — Assessment & Plan Note (Addendum)
Pending labs I may ask you start checking your blood sugars fasting a few mornings per week.  The goal is fasting blood sugar between 80-130.  You should check your feet for sores or wounds daily and let us know if you find something unusual.  See your eye doctor yearly for exam and make sure they check the back of your eye for diabetic eye exam.

## 2021-10-25 ENCOUNTER — Other Ambulatory Visit: Payer: Self-pay | Admitting: Medical

## 2021-10-25 LAB — URINALYSIS
Bilirubin, UA: NEGATIVE
Glucose, UA: NEGATIVE
Ketones, UA: NEGATIVE
Leukocytes,UA: NEGATIVE
Nitrite, UA: NEGATIVE
RBC, UA: NEGATIVE
Specific Gravity, UA: 1.023 (ref 1.005–1.030)
Urobilinogen, Ur: 1 mg/dL (ref 0.2–1.0)
pH, UA: 6 (ref 5.0–7.5)

## 2021-10-25 LAB — COMPREHENSIVE METABOLIC PANEL
ALT: 11 IU/L (ref 0–44)
AST: 21 IU/L (ref 0–40)
Albumin/Globulin Ratio: 1.8 (ref 1.2–2.2)
Albumin: 4.4 g/dL (ref 3.8–4.8)
Alkaline Phosphatase: 79 IU/L (ref 44–121)
BUN/Creatinine Ratio: 13 (ref 10–24)
BUN: 12 mg/dL (ref 8–27)
Bilirubin Total: 0.2 mg/dL (ref 0.0–1.2)
CO2: 20 mmol/L (ref 20–29)
Calcium: 9.1 mg/dL (ref 8.6–10.2)
Chloride: 104 mmol/L (ref 96–106)
Creatinine, Ser: 0.92 mg/dL (ref 0.76–1.27)
Globulin, Total: 2.4 g/dL (ref 1.5–4.5)
Glucose: 130 mg/dL — ABNORMAL HIGH (ref 70–99)
Potassium: 3.8 mmol/L (ref 3.5–5.2)
Sodium: 140 mmol/L (ref 134–144)
Total Protein: 6.8 g/dL (ref 6.0–8.5)
eGFR: 92 mL/min/{1.73_m2} (ref >59)

## 2021-10-25 LAB — CBC WITH DIFFERENTIAL/PLATELET
Basophils Absolute: 0 10*3/uL (ref 0.0–0.2)
Basos: 0 %
EOS (ABSOLUTE): 0.1 10*3/uL (ref 0.0–0.4)
Eos: 2 %
Hematocrit: 39.9 % (ref 37.5–51.0)
Hemoglobin: 13.3 g/dL (ref 13.0–17.7)
Immature Grans (Abs): 0 10*3/uL (ref 0.0–0.1)
Immature Granulocytes: 0 %
Lymphocytes Absolute: 2 10*3/uL (ref 0.7–3.1)
Lymphs: 40 %
MCH: 27.5 pg (ref 26.6–33.0)
MCHC: 33.3 g/dL (ref 31.5–35.7)
MCV: 83 fL (ref 79–97)
Monocytes Absolute: 0.4 10*3/uL (ref 0.1–0.9)
Monocytes: 9 %
Neutrophils Absolute: 2.4 10*3/uL (ref 1.4–7.0)
Neutrophils: 49 %
Platelets: 203 10*3/uL (ref 150–450)
RBC: 4.83 x10E6/uL (ref 4.14–5.80)
RDW: 13.6 % (ref 11.6–15.4)
WBC: 4.9 10*3/uL (ref 3.4–10.8)

## 2021-10-25 LAB — PT AND PTT
INR: 1 (ref 0.9–1.2)
Prothrombin Time: 10.5 s (ref 9.1–12.0)
aPTT: 30 s (ref 24–33)

## 2021-10-25 LAB — HEMOGLOBIN A1C
Est. average glucose Bld gHb Est-mCnc: 146 mg/dL
Hgb A1c MFr Bld: 6.7 % — ABNORMAL HIGH (ref 4.8–5.6)

## 2021-10-25 LAB — MICROALBUMIN / CREATININE URINE RATIO
Creatinine, Urine: 155.6 mg/dL
Microalb/Creat Ratio: 11 mg/g creat (ref 0–29)
Microalbumin, Urine: 16.8 ug/mL

## 2021-10-25 LAB — LIPID PANEL
Chol/HDL Ratio: 3.5 ratio (ref 0.0–5.0)
Cholesterol, Total: 134 mg/dL (ref 100–199)
HDL: 38 mg/dL — ABNORMAL LOW (ref >39)
LDL Chol Calc (NIH): 70 mg/dL (ref 0–99)
Triglycerides: 148 mg/dL (ref 0–149)
VLDL Cholesterol Cal: 26 mg/dL (ref 5–40)

## 2021-10-25 LAB — AFP TUMOR MARKER: AFP, Serum, Tumor Marker: 5.7 ng/mL (ref 0.0–8.4)

## 2021-10-25 LAB — PSA: Prostate Specific Ag, Serum: 0.5 ng/mL (ref 0.0–4.0)

## 2021-10-25 MED ORDER — BREZTRI AEROSPHERE 160-9-4.8 MCG/ACT IN AERO: 2.0000 | INHALATION_SPRAY | Freq: Two times a day (BID) | RESPIRATORY_TRACT | 0 refills | Status: AC

## 2021-10-25 MED ORDER — BLOOD GLUCOSE MONITOR KIT: 1.0000 | PACK | Freq: Every day | 11 refills | Status: AC

## 2021-10-25 MED ORDER — EMPAGLIFLOZIN 10 MG PO TABS: 10.0000 mg | ORAL_TABLET | Freq: Every day | ORAL | 0 refills | Status: AC

## 2021-10-27 NOTE — Progress Notes (Deleted)
Office Visit    Patient Name: Jay Fuller Date of Encounter: 10/27/2021  Primary Care Provider:  Carlena Hurl, PA-C Primary Cardiologist:  [name], MD  Chief Complaint    66 year old male with a history of CAD s/p NSTEMI, DES-LCx and OM1 in 2015, hypertension, hyperlipidemia, hepatitis C on Harvoni therapy, cirrhosis, chronic pain, and polysubstance abuse (tobacco, marijuana, and cocaine) who presents for follow-up related to CAD and hypertension.  Past Medical History    Past Medical History:  Diagnosis Date   CAD S/P percutaneous coronary angioplasty 06/2013   a. NSTEMI/Cath/PCI: LM nl, dLAD 20-40%, Diags small with ~95%; pLCx 20%, dLCx 99% * (DES PCI - 2.25x16 Promus Premier DES) & pOM1 80-90% (DES PCI: 2.5x20 Promus Premier DES), OM2 min irregs, LPL1 small, min irregs, RCA/RPDA min irregs, RPL nl, EF 55%.   Chronic pain    Cirrhosis ([place])    due to Hep C   Former smoker    quit 2019   Hepatitis B immune 2016   Hepatitis C    s/p Harvoni therapy 2015   History of NSTEMI 06/2013   99% dLCx (DES PCI); 80-90% OM1 (DES PCI) --normal EF.  Normal wall motion.;    Hyperlipidemia    Hypertension    Polysubstance abuse ([place])    history of cocaine, marijuana, tobacco abuse in the use   Wears glasses    Past Surgical History:  Procedure Laterality Date   COLONOSCOPY     denied, but Cologuard negative 12/2013   CORONARY ANGIOPLASTY WITH STENT PLACEMENT  06/2013    dLCx 99% going into LPL1 (DES PCI: 2.25x16 Promus Premier DES), p OM1 80-90% (DES PCI - 2.5x20 Promus Premier DES)   LEFT HEART CATH AND CORONARY ANGIOGRAPHY N/A 07/18/2019   Procedure: LEFT HEART CATH AND CORONARY ANGIOGRAPHY;  Surgeon: Leonie Man, MD;  Location: [place] [facility];  Service: Cardiovascular;; Two-vessel CAD with widely patent stents in OM1 and distal LCx-OM 3.  Otherwise minimal disease. No obvious culprit lesion. Normal EF and EDP.   LEFT HEART CATHETERIZATION WITH CORONARY  ANGIOGRAM N/A 06/23/2013   Procedure: LEFT HEART CATHETERIZATION WITH CORONARY ANGIOGRAM;  Surgeon: Leonie Man, MD;  Location: [facility] CATH LAB:: LM nl, d LAD 20-40%, small caliber Diags w/ severel ~95% lesions, p LCX 20%, dLCx 99% going into LPL1 (PCI- DES), p OM1 80-90% (PCI -DES), OM2 min irregs, LPL1 small, min irregs, RCA/RPDA min irregs, RPL nl, EF 55%.   LIVER BIOPSY  2016   TRANSTHORACIC ECHOCARDIOGRAM  06/2013   Normal LV size and moderate thickness/moderate LVH.  Normal EF 55-60%. No RWMA. Gr 1 DD. Mlid LA dilation    Allergies  No Known Allergies  History of Present Illness    66 year old male with the above past medical history including CAD s/p NSTEMI, DES-LCx and OM1 in 2015, hypertension, hyperlipidemia, hepatitis C on Harvoni therapy, cirrhosis, chronic pain, and polysubstance abuse (tobacco, marijuana, and cocaine).  He was hospitalized in 2015 in the setting of NSTEMI and underwent successful PCI with DES to LCx and OM1.  Echocardiogram at the time showed EF 55 to 60%, normal wall motion and G1 DD.  Most recent cardiac catheterization in March 2021 showed widely patent stents and otherwise only minimal CAD.  He was last seen in the office on 02/28/2021 stable overall from a cardiac standpoint though he did note some exertional fatigue, occasional left-sided chest pain which she described as a "throbbing" sensation.  He denied exertional symptoms.  His symptoms were thought to be stable overall.  His BP was somewhat elevated.  LDL cholesterol was elevated above goal.  Lipitor was transitioned to Crestor.  Carvedilol was increased to 25 mg twice daily in the setting of elevated BP.  He presents today for follow-up.  Since his last visit  CAD: Hypertension: Hyperlipidemia: Polysubstance abuse: Disposition:  Home Medications    Current Outpatient Medications  Medication Sig Dispense Refill   amLODipine (NORVASC) 10 MG tablet Take 1 tablet (10 mg total) by mouth daily. 90  tablet 0   blood glucose meter kit and supplies KIT Inject 1 each into the skin daily. Dispense based on patient and insurance preference. Use up to four times daily as directed. 1 each 11   Budeson-Glycopyrrol-Formoterol (BREZTRI AEROSPHERE) 160-9-4.8 MCG/ACT AERO Inhale 2 puffs into the lungs 2 (two) times daily. 10.7 g 0   carvedilol (COREG) 25 MG tablet Take one half tablet of 25 mg ( 12.5 mg) in the morning and  25 mg tablet  in the evening. 180 tablet 0   clopidogrel (PLAVIX) 75 MG tablet Take 1 tablet (75 mg total) by mouth daily. 90 tablet 0   diphenhydrAMINE (SOMINEX) 25 MG tablet Take 25 mg by mouth at bedtime.     empagliflozin (JARDIANCE) 10 MG TABS tablet Take 1 tablet (10 mg total) by mouth daily before breakfast. 90 tablet 0   irbesartan-hydrochlorothiazide (AVALIDE) 300-12.5 MG tablet Take 1 tablet by mouth daily. 90 tablet 0   isosorbide mononitrate (IMDUR) 30 MG 24 hr tablet Take 1 tablet (30 mg total) by mouth daily. 90 tablet 0   nitroGLYCERIN (NITROSTAT) 0.4 MG SL tablet PLACE 1 TABLET UNDER THE TONGUE EVERY 5 MINURES FOR 3 DOSES AS NEEDED FOR CHEST PAIN 30 tablet 0   rosuvastatin (CRESTOR) 20 MG tablet Take 1 tablet (20 mg total) by mouth daily. 90 tablet 3   VASCEPA 1 g capsule Take 2 capsules (2 g total) by mouth 2 (two) times daily. 360 capsule 0   No current facility-administered medications for this visit.     Review of Systems    ***.  All other systems reviewed and are otherwise negative except as noted above.    Physical Exam    VS:  There were no vitals taken for this visit. , BMI There is no height or weight on file to calculate BMI.     GEN: Well nourished, well developed, in no acute distress. HEENT: normal. Neck: Supple, no JVD, carotid bruits, or masses. Cardiac: RRR, no murmurs, rubs, or gallops. No clubbing, cyanosis, edema.  Radials/DP/PT 2+ and equal bilaterally.  Respiratory:  Respirations regular and unlabored, clear to auscultation  bilaterally. GI: Soft, nontender, nondistended, BS + x 4. MS: no deformity or atrophy. Skin: warm and dry, no rash. Neuro:  Strength and sensation are intact. Psych: Normal affect.  Accessory Clinical Findings    ECG personally reviewed by me today - *** - no acute changes.  Lab Results  Component Value Date   WBC 4.9 10/24/2021   HGB 13.3 10/24/2021   HCT 39.9 10/24/2021   MCV 83 10/24/2021   PLT 203 10/24/2021   Lab Results  Component Value Date   CREATININE 0.92 10/24/2021   BUN 12 10/24/2021   NA 140 10/24/2021   K 3.8 10/24/2021   CL 104 10/24/2021   CO2 20 10/24/2021   Lab Results  Component Value Date   ALT 11 10/24/2021   AST 21 10/24/2021   ALKPHOS 79  10/24/2021   BILITOT 0.2 10/24/2021   Lab Results  Component Value Date   CHOL 134 10/24/2021   HDL 38 (L) 10/24/2021   LDLCALC 70 10/24/2021   TRIG 148 10/24/2021   CHOLHDL 3.5 10/24/2021    Lab Results  Component Value Date   HGBA1C 6.7 (H) 10/24/2021    Assessment & Plan    1.  ***   Lenna Sciara, NP 10/27/2021, 3:57 PM

## 2021-10-28 ENCOUNTER — Ambulatory Visit: Payer: Medicare Other | Admitting: Nurse Practitioner

## 2021-10-28 DIAGNOSIS — I10 Essential (primary) hypertension: Secondary | ICD-10-CM

## 2021-10-28 DIAGNOSIS — I25119 Atherosclerotic heart disease of native coronary artery with unspecified angina pectoris: Secondary | ICD-10-CM

## 2021-10-28 DIAGNOSIS — E785 Hyperlipidemia, unspecified: Secondary | ICD-10-CM

## 2021-11-19 ENCOUNTER — Other Ambulatory Visit: Payer: Self-pay | Admitting: Medical

## 2021-11-19 DIAGNOSIS — I2510 Atherosclerotic heart disease of native coronary artery without angina pectoris: Secondary | ICD-10-CM

## 2021-11-19 DIAGNOSIS — I10 Essential (primary) hypertension: Secondary | ICD-10-CM

## 2021-11-19 DIAGNOSIS — E785 Hyperlipidemia, unspecified: Secondary | ICD-10-CM

## 2021-11-20 NOTE — Progress Notes (Deleted)
Cardiology Office Note:    Date:  11/20/2021   ID:  Jay Fuller, DOB 12-10-1955, MRN [id number]  PCP:  Caryl Ada Akins Cardiologist: [name], MD   Reason for visit: 6 month Follow-up  History of Present Illness:    Jay Fuller is a 66 y.o. male with a hx of CAD with NSTEMI in 2015 s/p DES to LCX and OM1, hypertension, hyperlipidemia, hepatitis C on Harvoni therapy, cirrhosis, chronic pain, and polysubstance abuse (tobacco, marijuana, and cocaine).  Patient was admitted in 2015 with NSTEMI and underwent successful PCI with DES to LCX and OM1. Echo at that time showed LVEF of 55-60% with normal wall motion and grade 1 diastolic dysfunction. Last cath in 07/2019 showed widely patent stents and otherwise only minimal CAD.  Continued on Plavix monotherapy.  Patient previously noted exertional fatigue, Coreg reduced to 12.5 mg in the morning and 25 mg in the evening.  He was last seen in October 2022 and mentioned occasional left-sided throbbing chest pain, mild occurs randomly -not exertional.  He was going to the [facility] about 3 times a week and walks on the treadmill for 15 to 20 minutes, walks on the track for 1/2 mile, and then rides a stationary bicycle for an additional 15 minutes -without chest pain or shortness of breath.  Chest Pain CAD - History of NSTEMI in 2015 s/p DES to LCX and OM1. Last cath in 07/2019 showed patent stents with otherwise only minimal CAD.   - He notes occasional left sided chest pain that is not related to exertion. This is not new and is stable. - Continue Plavix monotherapy.  - Continue antianginals: Coreg 12.'5mg'$  in the morning and '25mg'$  in the evening, Amlodipine '10mg'$  daily, and Imdur '30mg'$  daily. - Continue statin.   Hypertension -***  - BP elevated at 150/78 (was 162/82 on my recheck). He states BP is usually better controlled at home around 135/70. - Continue current medications: Amlodipine '10mg'$  daily, Coreg  12.'5mg'$  in the morning and '25mg'$  in the evening, Imdur '30mg'$  daily, and Irbesartan-HCTZ 300-12.'5mg'$  daily.  - Will have patient keep a BP/HR log for 2 weeks and then send [place] this. If BP consistently above 130/80, will likely increase Coreg back to '25mg'$  twice daily given patient denies any improvement in intermittent exertional fatigue with reduced dose.    Hyperlipidemia -***  - Lipid panel in 08/2020: Total Cholesterol 163, Triglycerides 236, HDL 43, LDL 81.  - LDL goal <70 given CAD.  - Currently on Lipitor '20mg'$  daily and Vascepa 2g twice daily.  - Patient is not fasting today. Will have him come back for fasting lipid panel and LFTs. If LDL still above goal, will switch to Crestor '20mg'$  daily per Dr. Allison Quarry last note.   Disposition: Follow up in ***.    Past Medical History:  Diagnosis Date   CAD S/P percutaneous coronary angioplasty 06/2013   a. NSTEMI/Cath/PCI: LM nl, dLAD 20-40%, Diags small with ~95%; pLCx 20%, dLCx 99% * (DES PCI - 2.25x16 Promus Premier DES) & pOM1 80-90% (DES PCI: 2.5x20 Promus Premier DES), OM2 min irregs, LPL1 small, min irregs, RCA/RPDA min irregs, RPL nl, EF 55%.   Chronic pain    Cirrhosis ([place])    due to Hep C   Former smoker    quit 2019   Hepatitis B immune 2016   Hepatitis C    s/p Harvoni therapy 2015   History of NSTEMI 06/2013   99% dLCx (DES PCI); 80-90%  OM1 (DES PCI) --normal EF.  Normal wall motion.;    Hyperlipidemia    Hypertension    Polysubstance abuse ([place])    history of cocaine, marijuana, tobacco abuse in the use   Wears glasses     Past Surgical History:  Procedure Laterality Date   COLONOSCOPY     denied, but Cologuard negative 12/2013   CORONARY ANGIOPLASTY WITH STENT PLACEMENT  06/2013    dLCx 99% going into LPL1 (DES PCI: 2.25x16 Promus Premier DES), p OM1 80-90% (DES PCI - 2.5x20 Promus Premier DES)   LEFT HEART CATH AND CORONARY ANGIOGRAPHY N/A 07/18/2019   Procedure: LEFT HEART CATH AND CORONARY ANGIOGRAPHY;  Surgeon:  Leonie Man, MD;  Location: [place] [facility];  Service: Cardiovascular;; Two-vessel CAD with widely patent stents in OM1 and distal LCx-OM 3.  Otherwise minimal disease. No obvious culprit lesion. Normal EF and EDP.   LEFT HEART CATHETERIZATION WITH CORONARY ANGIOGRAM N/A 06/23/2013   Procedure: LEFT HEART CATHETERIZATION WITH CORONARY ANGIOGRAM;  Surgeon: Leonie Man, MD;  Location: [facility] CATH LAB:: LM nl, d LAD 20-40%, small caliber Diags w/ severel ~95% lesions, p LCX 20%, dLCx 99% going into LPL1 (PCI- DES), p OM1 80-90% (PCI -DES), OM2 min irregs, LPL1 small, min irregs, RCA/RPDA min irregs, RPL nl, EF 55%.   LIVER BIOPSY  2016   TRANSTHORACIC ECHOCARDIOGRAM  06/2013   Normal LV size and moderate thickness/moderate LVH.  Normal EF 55-60%. No RWMA. Gr 1 DD. Mlid LA dilation    Current Medications: No outpatient medications have been marked as taking for the 11/21/21 encounter (Appointment) with Warren Lacy, PA-C.     Allergies:   Patient has no known allergies.   Social History   Socioeconomic History   Marital status: Single    Spouse name: Not on file   Number of children: Not on file   Years of education: Not on file   Highest education level: Not on file  Occupational History   Not on file  Tobacco Use   Smoking status: Former    Packs/day: 0.50    Years: 25.00    Total pack years: 12.50    Types: Cigarettes    Quit date: 01/21/2016    Years since quitting: 5.8   Smokeless tobacco: Never  Vaping Use   Vaping Use: Never used  Substance and Sexual Activity   Alcohol use: Yes    Alcohol/week: 2.0 standard drinks of alcohol    Types: 2 Cans of beer per week   Drug use: Not Currently    Comment: cocain and marijuana in the past   Sexual activity: Yes  Other Topics Concern   Not on file  Social History Narrative   Lives with girlfriend and son   Social Determinants of Health   Financial Resource Strain: Low Risk  (09/13/2021)   Overall Financial  Resource Strain (CARDIA)    Difficulty of Paying Living Expenses: Not hard at all  Food Insecurity: No Food Insecurity (09/13/2021)   Hunger Vital Sign    Worried About Running Out of Food in the Last Year: Never true    Ran Out of Food in the Last Year: Never true  Transportation Needs: No Transportation Needs (09/13/2021)   PRAPARE - Hydrologist (Medical): No    Lack of Transportation (Non-Medical): No  Physical Activity: Sufficiently Active (09/13/2021)   Exercise Vital Sign    Days of Exercise per Week: 4 days  Minutes of Exercise per Session: 60 min  Stress: No Stress Concern Present (09/13/2021)   [place]    Feeling of Stress : Not at all  Social Connections: Not on file     Family History: The patient's family history includes Cancer in his sister; Heart disease in his father and sister; Hypertension in his father, mother, sister, sister, and sister; Stroke in his sister. There is no history of Diabetes, Colon cancer, Stomach cancer, or Pancreatic cancer.  ROS:   Please see the history of present illness.     EKGs/Labs/Other Studies Reviewed:    EKG:  The ekg ordered today demonstrates ***  Recent Labs: 10/24/2021: ALT 11; BUN 12; Creatinine, Ser 0.92; Hemoglobin 13.3; Platelets 203; Potassium 3.8; Sodium 140   Recent Lipid Panel Lab Results  Component Value Date/Time   CHOL 134 10/24/2021 10:24 AM   TRIG 148 10/24/2021 10:24 AM   HDL 38 (L) 10/24/2021 10:24 AM   LDLCALC 70 10/24/2021 10:24 AM   LDLCALC 64 01/20/2017 09:33 AM    Physical Exam:    VS:  There were no vitals taken for this visit.   No data found.  No BP recorded.  {Refresh Note OR Click here to enter BP  :1}***    Wt Readings from Last 3 Encounters:  10/24/21 196 lb (88.9 kg)  09/13/21 190 lb (86.2 kg)  02/28/21 193 lb 6.4 oz (87.7 kg)     GEN: *** Well nourished, well developed in no acute  distress HEENT: Normal NECK: No JVD; No carotid bruits CARDIAC: ***RRR, no murmurs, rubs, gallops RESPIRATORY:  Clear to auscultation without rales, wheezing or rhonchi  ABDOMEN: Soft, non-tender, non-distended MUSCULOSKELETAL: No edema; No deformity  SKIN: Warm and dry NEUROLOGIC:  Alert and oriented PSYCHIATRIC:  Normal affect     ASSESSMENT AND PLAN   ***   {Are you ordering a CV Procedure (e.g. stress test, cath, DCCV, TEE, etc)?   Press F2        :[id number]}    Medication Adjustments/Labs and Tests Ordered: Current medicines are reviewed at length with the patient today.  Concerns regarding medicines are outlined above.  No orders of the defined types were placed in this encounter.  No orders of the defined types were placed in this encounter.   There are no Patient Instructions on file for this visit.   Signed, Warren Lacy, PA-C  11/20/2021 12:57 PM    [facility]

## 2021-11-21 ENCOUNTER — Ambulatory Visit: Payer: Medicare Other | Admitting: Physician Assistant

## 2021-11-21 DIAGNOSIS — E785 Hyperlipidemia, unspecified: Secondary | ICD-10-CM

## 2021-11-21 DIAGNOSIS — I25119 Atherosclerotic heart disease of native coronary artery with unspecified angina pectoris: Secondary | ICD-10-CM

## 2021-11-21 DIAGNOSIS — I10 Essential (primary) hypertension: Secondary | ICD-10-CM

## 2021-12-04 ENCOUNTER — Inpatient Hospital Stay: Admission: RE | Admit: 2021-12-04 | Payer: Medicare Other | Source: Ambulatory Visit

## 2021-12-18 ENCOUNTER — Other Ambulatory Visit: Payer: Self-pay | Admitting: Medical

## 2021-12-18 DIAGNOSIS — I10 Essential (primary) hypertension: Secondary | ICD-10-CM

## 2021-12-19 NOTE — Telephone Encounter (Signed)
Pt has upcoming appt in october

## 2021-12-24 NOTE — Progress Notes (Deleted)
Cardiology Office Note:    Date:  12/24/2021   ID:  Jay Fuller, DOB 1956/02/08, MRN [id number]  PCP:  Jay Fuller Cardiologist: Jay Hew, MD   Reason for visit: 81-monthfollow-up  History of Present Illness:    Jay Sheltonis a 66y.o. male with a hx of CAD with NSTEMI in 2015 s/p DES to LCX and OM1, hypertension, hyperlipidemia, hepatitis C on Harvoni therapy, cirrhosis, chronic pain, and prior polysubstance abuse (tobacco, marijuana, and cocaine).  Last cath in 07/2019 showed widely patent stents and otherwise only minimal CAD.  He was last seen by Jay Fuller October 2022.  She noted patient had no difference in exertional fatigue with reduced dose of Coreg.  He continued to have occasional throbbing left-sided chest pain, though [facility]  BP was elevated at visit, asked to keep BP log at home.    Today, ***  Coronary artery disease -NSTEMI in 2015 s/p DES to LCX and OM1 -LHC 07/2019 showed widely patent stents and otherwise only minimal CAD. -Continue Plavix monotherapy. -***  Hypertension -*** -Goal BP is <130/80.  Recommend DASH diet (high in vegetables, fruits, low-fat dairy products, whole grains, poultry, fish, and nuts and low in sweets, sugar-sweetened beverages, and red meats), salt restriction and increase physical activity.  Hyperlipidemia -*** -Discussed cholesterol lowering diets - Mediterranean diet, DASH diet, vegetarian diet, low-carbohydrate diet and avoidance of trans fats.  Discussed healthier choice substitutes.  Nuts, high-fiber foods, and fiber supplements may also improve lipids.    Obesity -Discussed how even a 5-10% weight loss can have cardiovascular benefits.   -Recommend moderate intensity activity for 30 minutes 5 days/week and the DASH diet.  Tobacco use  -Recommend tobacco cessation.  Reviewed physiologic effects of nicotine and the immediate-eventual  benefits of quitting including improvement in cough/breathing and reduction in cardiovascular events.  Discussed quitting tips such as removing triggers and getting support from family/friends and Quitline [place]. -USPSTF recommends one-time screening for abdominal aortic aneurysm (AAA) by ultrasound in men 629-741years old who have ever smoked.      Disposition - Follow-up in ***     Past Medical History:  Diagnosis Date   CAD S/P percutaneous coronary angioplasty 06/2013   a. NSTEMI/Cath/PCI: LM nl, dLAD 20-40%, Diags small with ~95%; pLCx 20%, dLCx 99% * (DES PCI - 2.25x16 Promus Premier DES) & pOM1 80-90% (DES PCI: 2.5x20 Promus Premier DES), OM2 min irregs, LPL1 small, min irregs, RCA/RPDA min irregs, RPL nl, EF 55%.   [place]    [facility] [place]    [place] to [place]   Former smoker    quit 2019   Hepatitis B immune 2016   Hepatitis C    s/p Harvoni therapy 2015   History of NSTEMI 06/2013   99% dLCx (DES PCI); 80-90% OM1 (DES PCI) --normal EF.  Normal wall motion.;    Hyperlipidemia    Hypertension    [place]    history of cocaine, marijuana, tobacco abuse in the use   Wears glasses     Past Surgical History:  Procedure Laterality Date   COLONOSCOPY     denied, but Cologuard negative 12/2013   CORONARY ANGIOPLASTY WITH STENT PLACEMENT  06/2013    dLCx 99% going into LPL1 (DES PCI: 2.25x16 Promus Premier DES), p OM1 80-90% (DES PCI - 2.5x20 Promus Premier DES)   LEFT HEART CATH AND CORONARY ANGIOGRAPHY N/A 07/18/2019  Procedure: LEFT HEART CATH AND CORONARY ANGIOGRAPHY;  Surgeon: Leonie Man, MD;  Location: [place] [facility];  Service: Cardiovascular;; Two-vessel CAD with widely patent stents in OM1 and distal LCx-OM 3.  Otherwise minimal disease. No obvious culprit lesion. Normal EF and EDP.   LEFT HEART CATHETERIZATION WITH CORONARY ANGIOGRAM N/A 06/23/2013   Procedure: LEFT HEART CATHETERIZATION WITH CORONARY ANGIOGRAM;  Surgeon: Leonie Man, MD;   [facility] CATH LAB:: LM nl, d LAD 20-40%, small caliber Diags w/ severel ~95% lesions, p LCX 20%, dLCx 99% going into LPL1 (PCI- DES), p OM1 80-90% (PCI -DES), OM2 min irregs, LPL1 small, min irregs, RCA/RPDA min irregs, RPL nl, EF 55%.   LIVER BIOPSY  2016   TRANSTHORACIC ECHOCARDIOGRAM  06/2013   Normal LV size and moderate thickness/moderate LVH.  Normal EF 55-60%. No RWMA. Gr 1 DD. Mlid LA dilation    Current Medications: No outpatient medications have been marked as taking for the 12/25/21 encounter (Appointment) with Warren Lacy, PA-C.     Allergies:   Patient has no known allergies.   Social History   Socioeconomic History   Marital status: Single    Spouse name: Not on file   Number of children: Not on file   Years of education: Not on file   Highest education level: Not on file  Occupational History   Not on file  Tobacco Use   Smoking status: Former    Packs/day: 0.50    Years: 25.00    Total pack years: 12.50    Types: Cigarettes    Quit date: 01/21/2016    Years since quitting: 5.9   Smokeless tobacco: Never  Vaping Use   Vaping Use: Never used  Substance and Sexual Activity   Alcohol use: Yes    Alcohol/week: 2.0 standard drinks of alcohol    Types: 2 Cans of beer per week   Drug use: Not Currently    Comment: cocain and marijuana in the past   Sexual activity: Yes  Other Topics Concern   Not on file  Social History Narrative   Lives with girlfriend and son   Social Determinants of Health   Financial Resource Strain: Low Risk  (09/13/2021)   Overall Financial Resource Strain (CARDIA)    Difficulty of Paying Living Expenses: Not hard at all  Food Insecurity: No Food Insecurity (09/13/2021)   Hunger Vital Sign    Worried About Running Out of Food in the Last Year: Never true    Ran Out of Food in the Last Year: Never true  Transportation Needs: No Transportation Needs (09/13/2021)   PRAPARE - Hydrologist (Medical):  No    Lack of Transportation (Non-Medical): No  Physical Activity: Sufficiently Active (09/13/2021)   Exercise Vital Sign    Days of Exercise per Week: 4 days    Minutes of Exercise per Session: 60 min  Stress: No Stress Concern Present (09/13/2021)   [place]    Feeling of Stress : Not at all  Social Connections: Not on file     Family History: The patient's family history includes Cancer in his sister; Heart disease in his father and sister; Hypertension in his father, mother, sister, sister, and sister; Stroke in his sister. There is no history of Diabetes, Colon cancer, Stomach cancer, or Pancreatic cancer.  ROS:   Please see the history of present illness.     EKGs/Labs/Other Studies Reviewed:  EKG:  The ekg ordered today demonstrates ***  Recent Labs: 10/24/2021: ALT 11; BUN 12; Creatinine, Ser 0.92; Hemoglobin 13.3; Platelets 203; Potassium 3.8; Sodium 140   Recent Lipid Panel Lab Results  Component Value Date/Time   CHOL 134 10/24/2021 10:24 AM   TRIG 148 10/24/2021 10:24 AM   HDL 38 (L) 10/24/2021 10:24 AM   LDLCALC 70 10/24/2021 10:24 AM   LDLCALC 64 01/20/2017 09:33 AM    Physical Exam:    VS:  There were no vitals taken for this visit.   No data found.  No BP recorded.  {Refresh Note OR Click here to enter BP  :1}***    Wt Readings from Last 3 Encounters:  10/24/21 196 lb (88.9 kg)  09/13/21 190 lb (86.2 kg)  02/28/21 193 lb 6.4 oz (87.7 kg)     GEN: *** Well nourished, well developed in no acute distress HEENT: Normal NECK: No JVD; No carotid bruits CARDIAC: ***RRR, no murmurs, rubs, gallops RESPIRATORY:  Clear to auscultation without rales, wheezing or rhonchi  ABDOMEN: Soft, non-tender, non-distended MUSCULOSKELETAL: No edema; No deformity  SKIN: Warm and dry NEUROLOGIC:  Alert and oriented PSYCHIATRIC:  Normal affect     ASSESSMENT AND PLAN   ***   {Are you ordering a CV  Procedure (e.g. stress test, cath, DCCV, TEE, etc)?   Press F2        :[id number]}    Medication Adjustments/Labs and Tests Ordered: Current medicines are reviewed at length with the patient today.  Concerns regarding medicines are outlined above.  No orders of the defined types were placed in this encounter.  No orders of the defined types were placed in this encounter.   There are no Patient Instructions on file for this visit.   Signed, Warren Lacy, PA-C  12/24/2021 9:45 PM    [facility]

## 2021-12-25 ENCOUNTER — Ambulatory Visit: Payer: Medicare Other | Admitting: Physician Assistant

## 2021-12-25 DIAGNOSIS — E785 Hyperlipidemia, unspecified: Secondary | ICD-10-CM

## 2021-12-25 DIAGNOSIS — I10 Essential (primary) hypertension: Secondary | ICD-10-CM

## 2021-12-25 DIAGNOSIS — I25119 Atherosclerotic heart disease of native coronary artery with unspecified angina pectoris: Secondary | ICD-10-CM

## 2022-01-21 NOTE — Progress Notes (Deleted)
Cardiology Office Note:    Date:  01/21/2022   ID:  Jay Fuller, DOB 1956/01/31, MRN [id number]  PCP:  Jay Fuller Cardiologist: Jay Hew, MD   Reason for visit: 44-monthfollow-up (overdue)  History of Present Illness:    FSiah Steelyis a 66y.o. male with a hx of CAD with NSTEMI in 2015 s/p DES to LCX and OM1, hypertension, hyperlipidemia, hepatitis C on Harvoni therapy, cirrhosis, chronic pain, and prior polysubstance abuse (tobacco, marijuana, and cocaine).  Last cath in 07/2019 showed widely patent stents and otherwise only minimal CAD.  He was last seen by CSande Rivesin October 2022.  She noted patient had no difference in exertional fatigue with reduced dose of Coreg.  He continued to have occasional throbbing left-sided chest pain, though [facility]  BP was elevated at visit, asked to keep BP log at home.    Today, ***  Coronary artery disease -NSTEMI in 2015 s/p DES to LCX and OM1 -LHC 07/2019 showed widely patent stents and otherwise only minimal CAD. -Continue Plavix monotherapy. -***  Hypertension -*** -Goal BP is <130/80.  Recommend DASH diet (high in vegetables, fruits, low-fat dairy products, whole grains, poultry, fish, and nuts and low in sweets, sugar-sweetened beverages, and red meats), salt restriction and increase physical activity.  Hyperlipidemia -*** -Discussed cholesterol lowering diets - Mediterranean diet, DASH diet, vegetarian diet, low-carbohydrate diet and avoidance of trans fats.  Discussed healthier choice substitutes.  Nuts, high-fiber foods, and fiber supplements may also improve lipids.    Obesity -Discussed how even a 5-10% weight loss can have cardiovascular benefits.   -Recommend moderate intensity activity for 30 minutes 5 days/week and the DASH diet.  Tobacco use  -Recommend tobacco cessation.  Reviewed physiologic effects of nicotine and the  immediate-eventual benefits of quitting including improvement in cough/breathing and reduction in cardiovascular events.  Discussed quitting tips such as removing triggers and getting support from family/friends and Quitline [place]. -USPSTF recommends one-time screening for abdominal aortic aneurysm (AAA) by ultrasound in men 631-766years old who have ever smoked.      Disposition - Follow-up in ***     Past Medical History:  Diagnosis Date   CAD S/P percutaneous coronary angioplasty 06/2013   a. NSTEMI/Cath/PCI: LM nl, dLAD 20-40%, Diags small with ~95%; pLCx 20%, dLCx 99% * (DES PCI - 2.25x16 Promus Premier DES) & pOM1 80-90% (DES PCI: 2.5x20 Promus Premier DES), OM2 min irregs, LPL1 small, min irregs, RCA/RPDA min irregs, RPL nl, EF 55%.   Chronic pain    [place]    due to Hep C   Former smoker    quit 2019   Hepatitis B immune 2016   Hepatitis C    s/p Harvoni therapy 2015   History of NSTEMI 06/2013   99% dLCx (DES PCI); 80-90% OM1 (DES PCI) --normal EF.  Normal wall motion.;    Hyperlipidemia    Hypertension    [place]ubstance [place]    history of cocaine, marijuana, tobacco abuse in the use   Wears glasses     Past Surgical History:  Procedure Laterality Date   COLONOSCOPY     denied, but Cologuard negative 12/2013   CORONARY ANGIOPLASTY WITH STENT PLACEMENT  06/2013    dLCx 99% going into LPL1 (DES PCI: 2.25x16 Promus Premier DES), p OM1 80-90% (DES PCI - 2.5x20 Promus Premier DES)   LEFT HEART CATH AND CORONARY ANGIOGRAPHY N/A  07/18/2019   Procedure: LEFT HEART CATH AND CORONARY ANGIOGRAPHY;  Surgeon: Leonie Man, MD;  Location: [place] [facility];  Service: Cardiovascular;; Two-vessel CAD with widely patent stents in OM1 and distal LCx-OM 3.  Otherwise minimal disease. No obvious culprit lesion. Normal EF and EDP.   LEFT HEART CATHETERIZATION WITH CORONARY ANGIOGRAM N/A 06/23/2013   Procedure: LEFT HEART CATHETERIZATION WITH CORONARY ANGIOGRAM;  Surgeon:  Leonie Man, MD;  Location: [facility] CATH LAB:: LM nl, d LAD 20-40%, small caliber Diags w/ severel ~95% lesions, p LCX 20%, dLCx 99% going into LPL1 (PCI- DES), p OM1 80-90% (PCI -DES), OM2 min irregs, LPL1 small, min irregs, RCA/RPDA min irregs, RPL nl, EF 55%.   LIVER BIOPSY  2016   TRANSTHORACIC ECHOCARDIOGRAM  06/2013   Normal LV size and moderate thickness/moderate LVH.  Normal EF 55-60%. No RWMA. Gr 1 DD. Mlid LA dilation    Current Medications: No outpatient medications have been marked as taking for the 01/22/22 encounter (Appointment) with Jay Lacy, PA-C.     Allergies:   Patient has no known allergies.   Social History   Socioeconomic History   Marital status: Single    Spouse name: Not on file   Number of children: Not on file   Years of education: Not on file   Highest education level: Not on file  Occupational History   Not on file  Tobacco Use   Smoking status: Former    Packs/day: 0.50    Years: 25.00    Total pack years: 12.50    Types: Cigarettes    Quit date: 01/21/2016    Years since quitting: 6.0   Smokeless tobacco: Never  Vaping Use   Vaping Use: Never used  Substance and Sexual Activity   Alcohol use: Yes    Alcohol/week: 2.0 standard drinks of alcohol    Types: 2 Cans of beer per week   Drug use: Not Currently    Comment: cocain and marijuana in the past   Sexual activity: Yes  Other Topics Concern   Not on file  Social History Narrative   Lives with girlfriend and son   Social Determinants of Health   Financial Resource Strain: Low Risk  (09/13/2021)   Overall Financial Resource Strain (CARDIA)    Difficulty of Paying Living Expenses: Not hard at all  Food Insecurity: No Food Insecurity (09/13/2021)   Hunger Vital Sign    Worried About Running Out of Food in the Last Year: Never true    Ran Out of Food in the Last Year: Never true  Transportation Needs: No Transportation Needs (09/13/2021)   PRAPARE - Radiographer, therapeutic (Medical): No    Lack of Transportation (Non-Medical): No  Physical Activity: Sufficiently Active (09/13/2021)   Exercise Vital Sign    Days of Exercise per Week: 4 days    Minutes of Exercise per Session: 60 min  Stress: No Stress Concern Present (09/13/2021)   [place]    Feeling of Stress : Not at all  Social Connections: Not on file     Family History: The patient's family history includes Cancer in his sister; Heart disease in his father and sister; Hypertension in his father, mother, sister, sister, and sister; Stroke in his sister. There is no history of Diabetes, Colon cancer, Stomach cancer, or Pancreatic cancer.  ROS:   Please see the history of present illness.  EKGs/Labs/Other Studies Reviewed:    EKG:  The ekg ordered today demonstrates ***  Recent Labs: 10/24/2021: ALT 11; BUN 12; Creatinine, Ser 0.92; Hemoglobin 13.3; Platelets 203; Potassium 3.8; Sodium 140   Recent Lipid Panel Lab Results  Component Value Date/Time   CHOL 134 10/24/2021 10:24 AM   TRIG 148 10/24/2021 10:24 AM   HDL 38 (L) 10/24/2021 10:24 AM   LDLCALC 70 10/24/2021 10:24 AM   LDLCALC 64 01/20/2017 09:33 AM    Physical Exam:    VS:  There were no vitals taken for this visit.   No data found.  No BP recorded.  {Refresh Note OR Click here to enter BP  :1}***    Wt Readings from Last 3 Encounters:  10/24/21 196 lb (88.9 kg)  09/13/21 190 lb (86.2 kg)  02/28/21 193 lb 6.4 oz (87.7 kg)     GEN: *** Well nourished, well developed in no acute distress HEENT: Normal NECK: No JVD; No carotid bruits CARDIAC: ***RRR, no murmurs, rubs, gallops RESPIRATORY:  Clear to auscultation without rales, wheezing or rhonchi  ABDOMEN: Soft, non-tender, non-distended MUSCULOSKELETAL: No edema; No deformity  SKIN: Warm and dry NEUROLOGIC:  Alert and oriented PSYCHIATRIC:  Normal affect     ASSESSMENT AND PLAN    ***   {Are you ordering a CV Procedure (e.g. stress test, cath, DCCV, TEE, etc)?   Press F2        :[id number]}    Medication Adjustments/Labs and Tests Ordered: Current medicines are reviewed at length with the patient today.  Concerns regarding medicines are outlined above.  No orders of the defined types were placed in this encounter.  No orders of the defined types were placed in this encounter.   There are no Patient Instructions on file for this visit.   Signed, Jay Lacy, PA-C  01/21/2022 9:03 AM    [place]

## 2022-01-22 ENCOUNTER — Ambulatory Visit: Payer: Medicare Other | Admitting: Physician Assistant

## 2022-01-22 DIAGNOSIS — I10 Essential (primary) hypertension: Secondary | ICD-10-CM

## 2022-01-22 DIAGNOSIS — I2510 Atherosclerotic heart disease of native coronary artery without angina pectoris: Secondary | ICD-10-CM

## 2022-01-22 DIAGNOSIS — E785 Hyperlipidemia, unspecified: Secondary | ICD-10-CM

## 2022-02-17 ENCOUNTER — Other Ambulatory Visit: Payer: Self-pay | Admitting: Medical

## 2022-02-17 DIAGNOSIS — I10 Essential (primary) hypertension: Secondary | ICD-10-CM

## 2022-02-17 DIAGNOSIS — E785 Hyperlipidemia, unspecified: Secondary | ICD-10-CM

## 2022-02-17 DIAGNOSIS — I2510 Atherosclerotic heart disease of native coronary artery without angina pectoris: Secondary | ICD-10-CM

## 2022-02-20 ENCOUNTER — Telehealth: Payer: Self-pay | Admitting: Medical

## 2022-02-20 ENCOUNTER — Ambulatory Visit: Payer: Medicare Other | Admitting: Medical

## 2022-02-20 NOTE — Telephone Encounter (Signed)
  This patient no showed for their appointment today.Which of the following is necessary for this patient.   A) No follow-up necessary   B) Follow-up urgent. Locate Patient Immediately.   C) Follow-up necessary. Contact patient and Schedule visit in ____ Days.   D) Follow-up Advised. Contact patient and Schedule visit in ____ Days.   E) Please Send no show letter to patient. Charge no show fee if no show was a CPE.    Research as I feel he may be a repeat no show offender.  If so, send to Beverlee Nims as we may need to dismiss

## 2022-02-21 ENCOUNTER — Telehealth: Payer: Self-pay | Admitting: Medical

## 2022-02-21 NOTE — Telephone Encounter (Signed)
He had his first no show letter in 2017 and 2nd in 2019. Jay Fuller advised if he was a repeat to send to you to decide if he should be dismissed.

## 2022-02-25 ENCOUNTER — Ambulatory Visit: Payer: Medicare Other | Admitting: Physician Assistant

## 2022-02-25 NOTE — Telephone Encounter (Signed)
Pt cancelled 08/22/21, 10/04/2021 and No showed 02/20/2022.  He received his 2nd NO SHOW letter on 02/05/2018.  I am not sure he is getting good patient care since he keeps cancelling and no showing. Please advise.

## 2022-02-26 ENCOUNTER — Encounter: Payer: Self-pay | Admitting: Family Medicine

## 2022-02-26 NOTE — Telephone Encounter (Signed)
Dismissal sent

## 2022-03-18 ENCOUNTER — Ambulatory Visit: Payer: Medicare Other | Admitting: Nurse Practitioner

## 2022-03-29 NOTE — Progress Notes (Deleted)
Cardiology Office Note:    Date:  03/29/2022   ID:  Jay Fuller, DOB 1956/03/20, MRN [id number]  PCP:  No primary care provider on file.  Cardiologist:  [name], MD  Electrophysiologist:  None   Referring MD: Carlena Hurl, PA-C   Chief Complaint: follow-up of CAD  History of Present Illness:    Jay Fuller is a 66 y.o. male with a history of CAD with NSTEMI in 2015 s/p DES to LCX and OM1, hypertension, hyperlipidemia, hepatitis C on Harvoni therapy, cirrhosis, chronic pain, and polysubstance abuse (tobacco, marijuana, and cocaine) who is followed by Dr. Ellyn Hack and presents today for routine follow-up.    Patient was admitted in 2015 with NSTEMI and underwent successful PCI with DES to LCX and OM1. Echo at that time showed LVEF of 55-60% with normal wall motion and grade 1 diastolic dysfunction. Last cath in 07/2019 showed widely patent stents and otherwise only minimal CAD.  He was last seen by me in 02/2021 at which time he reported continued exertional fatigue with no significant difference after his dose of Coreg was reduced. He also reported occasional non-exertional left sided chest pain that he described as a throbbing sensation but this was not new and was stable. No further cardiac work-up was felt to be necessary at that time.  Patient presents today for routine follow-up. ***   CAD History of NSTEMI in 2015 s/p DES to LCX and OM1. Last cath in 07/2019 showed patent stents with otherwise only minimal CAD.  - *** - Continue Plavix monotherapy.  - Continue antianginals: Coreg 12.'5mg'$  in the morning and '25mg'$  in the evening, Amlodipine '10mg'$  daily, and Imdur '30mg'$  daily. - Continue statin.   Hypertension *** - Continue current medications: Amlodipine '10mg'$  daily, Coreg 12.'5mg'$  in the morning and '25mg'$  in the evening, Imdur '30mg'$  daily, and Irbesartan-HCTZ 300-12.'5mg'$  daily.    Hyperlipidemia Lipid panel in 10/2021: Total Cholesterol 134, Triglycerides 148,  HDL 38, LDL 70.  LDL goal <70 given CAD.  - Continue Crestor '20mg'$  daily and Vascepa 2g twice daily.   Past Medical History:  Diagnosis Date   CAD S/P percutaneous coronary angioplasty 06/2013   a. NSTEMI/Cath/PCI: LM nl, dLAD 20-40%, Diags small with ~95%; pLCx 20%, dLCx 99% * (DES PCI - 2.25x16 Promus Premier DES) & pOM1 80-90% (DES PCI: 2.5x20 Promus Premier DES), OM2 min irregs, LPL1 small, min irregs, RCA/RPDA min irregs, RPL nl, EF 55%.   Chronic pain    Cirrhosis ([place])    due to Hep C   Former smoker    quit 2019   Hepatitis B immune 2016   Hepatitis C    s/p Harvoni therapy 2015   History of NSTEMI 06/2013   99% dLCx (DES PCI); 80-90% OM1 (DES PCI) --normal EF.  Normal wall motion.;    Hyperlipidemia    Hypertension    Polysubstance abuse ([place])    history of cocaine, marijuana, tobacco abuse in the use   Wears glasses     Past Surgical History:  Procedure Laterality Date   COLONOSCOPY     denied, but Cologuard negative 12/2013   CORONARY ANGIOPLASTY WITH STENT PLACEMENT  06/2013    dLCx 99% going into LPL1 (DES PCI: 2.25x16 Promus Premier DES), p OM1 80-90% (DES PCI - 2.5x20 Promus Premier DES)   LEFT HEART CATH AND CORONARY ANGIOGRAPHY N/A 07/18/2019   Procedure: LEFT HEART CATH AND CORONARY ANGIOGRAPHY;  Surgeon: Leonie Man, MD;  Location: [place] [facility];  Service: Cardiovascular;; Two-vessel CAD with widely patent stents in OM1 and distal LCx-OM 3.  Otherwise minimal disease. No obvious culprit lesion. Normal EF and EDP.   LEFT HEART CATHETERIZATION WITH CORONARY ANGIOGRAM N/A 06/23/2013   Procedure: LEFT HEART CATHETERIZATION WITH CORONARY ANGIOGRAM;  Surgeon: Leonie Man, MD;  Location: [facility] CATH LAB:: LM nl, d LAD 20-40%, small caliber Diags w/ severel ~95% lesions, p LCX 20%, dLCx 99% going into LPL1 (PCI- DES), p OM1 80-90% (PCI -DES), OM2 min irregs, LPL1 small, min irregs, RCA/RPDA min irregs, RPL nl, EF 55%.   LIVER BIOPSY  2016   TRANSTHORACIC  ECHOCARDIOGRAM  06/2013   Normal LV size and moderate thickness/moderate LVH.  Normal EF 55-60%. No RWMA. Gr 1 DD. Mlid LA dilation    Current Medications: No outpatient medications have been marked as taking for the 04/01/22 encounter (Appointment) with Darreld Mclean, PA-C.     Allergies:   Patient has no known allergies.   Social History   Socioeconomic History   Marital status: Single    Spouse name: Not on file   Number of children: Not on file   Years of education: Not on file   Highest education level: Not on file  Occupational History   Not on file  Tobacco Use   Smoking status: Former    Packs/day: 0.50    Years: 25.00    Total pack years: 12.50    Types: Cigarettes    Quit date: 01/21/2016    Years since quitting: 6.1   Smokeless tobacco: Never  Vaping Use   Vaping Use: Never used  Substance and Sexual Activity   Alcohol use: Yes    Alcohol/week: 2.0 standard drinks of alcohol    Types: 2 Cans of beer per week   Drug use: Not Currently    Comment: cocain and marijuana in the past   Sexual activity: Yes  Other Topics Concern   Not on file  Social History Narrative   Lives with girlfriend and son   Social Determinants of Health   Financial Resource Strain: Low Risk  (09/13/2021)   Overall Financial Resource Strain (CARDIA)    Difficulty of Paying Living Expenses: Not hard at all  Food Insecurity: No Food Insecurity (09/13/2021)   Hunger Vital Sign    Worried About Running Out of Food in the Last Year: Never true    Ran Out of Food in the Last Year: Never true  Transportation Needs: No Transportation Needs (09/13/2021)   PRAPARE - Hydrologist (Medical): No    Lack of Transportation (Non-Medical): No  Physical Activity: Sufficiently Active (09/13/2021)   Exercise Vital Sign    Days of Exercise per Week: 4 days    Minutes of Exercise per Session: 60 min  Stress: No Stress Concern Present (09/13/2021)   [place]    Feeling of Stress : Not at all  Social Connections: Not on file     Family History: The patient's family history includes Cancer in his sister; Heart disease in his father and sister; Hypertension in his father, mother, sister, sister, and sister; Stroke in his sister. There is no history of Diabetes, Colon cancer, Stomach cancer, or Pancreatic cancer.  ROS:   Please see the history of present illness.     EKGs/Labs/Other Studies Reviewed:    The following studies were reviewed:  Echocardiogram 06/24/2013: Study Conclusions: - Left ventricle: The cavity size was  normal. Wall thickness    was increased in a pattern of moderate LVH. Systolic    function was normal. The estimated ejection fraction was    in the range of 55% to 60%. Wall motion was normal; there    were no regional wall motion abnormalities. Doppler    parameters are consistent with abnormal left ventricular    relaxation (grade 1 diastolic dysfunction).  - Left atrium: The atrium was mildly dilated.  _______________   Left Cardiac Catheterization 07/18/2019: Previously placed 1st Mrg stent (Promus Premier DES 2.5 mm x 20 mm - 2.75 mm) is widely patent. Previously placed dLCx/3rd Mrg-1 Stent (Promus Premier DES 2.25 mm x 13m - 2.5 mm) is widely patent. DLCx-3rd Mrg-2 lesion is 30% stenosed. The left ventricular systolic function is The left ventricular ejection fraction is 55-65% by visual estimate. LV end diastolic pressure is normal.   Summary: Two-vessel CAD with widely patent stents in OM1 and distal LCx-OM 3.  Otherwise minimal disease. No obvious culprit lesion. Normal EF and EDP.   Recommendations: Okay to discharge home after bedrest Consider nonanginal etiology for chest pain-less likely microvascular disease. Follow-up as scheduled.   Diagnostic Dominance: Right     EKG:  EKG ordered today. EKG personally reviewed and demonstrates  ***.  Recent Labs: 10/24/2021: ALT 11; BUN 12; Creatinine, Ser 0.92; Hemoglobin 13.3; Platelets 203; Potassium 3.8; Sodium 140  Recent Lipid Panel    Component Value Date/Time   CHOL 134 10/24/2021 1024   TRIG 148 10/24/2021 1024   HDL 38 (L) 10/24/2021 1024   CHOLHDL 3.5 10/24/2021 1024   CHOLHDL 3.1 01/20/2017 0933   VLDL 48 (H) 07/25/2015 0001   LDLCALC 70 10/24/2021 1024   LDLCALC 64 01/20/2017 0933    Physical Exam:    Vital Signs: There were no vitals taken for this visit.    Wt Readings from Last 3 Encounters:  10/24/21 196 lb (88.9 kg)  09/13/21 190 lb (86.2 kg)  02/28/21 193 lb 6.4 oz (87.7 kg)     General: 66y.o. male in no acute distress. HEENT: Normocephalic and atraumatic. Sclera clear. EOMs intact. Neck: Supple. No carotid bruits. No JVD. Heart: *** RRR. Distinct S1 and S2. No murmurs, gallops, or rubs. Radial and distal pedal pulses 2+ and equal bilaterally. Lungs: No increased work of breathing. Clear to ausculation bilaterally. No wheezes, rhonchi, or rales.  Abdomen: Soft, non-distended, and non-tender to palpation. Bowel sounds present in all 4 quadrants.  MSK: Normal strength and tone for age. *** Extremities: No lower extremity edema.    Skin: Warm and dry. Neuro: Alert and oriented x3. No focal deficits. Psych: Normal affect. Responds appropriately.   Assessment:    No diagnosis found.  Plan:     Disposition: Follow up in ***   Medication Adjustments/Labs and Tests Ordered: Current medicines are reviewed at length with the patient today.  Concerns regarding medicines are outlined above.  No orders of the defined types were placed in this encounter.  No orders of the defined types were placed in this encounter.   There are no Patient Instructions on file for this visit.   Signed, CDarreld Mclean PA-C  03/29/2022 10:00 PM    [place]

## 2022-03-31 ENCOUNTER — Encounter: Payer: Self-pay | Admitting: Student

## 2022-03-31 ENCOUNTER — Telehealth: Payer: Self-pay | Admitting: Licensed Clinical Social Worker

## 2022-03-31 NOTE — Telephone Encounter (Signed)
H&V Care Navigation CSW Progress Note  Clinical Social Worker contacted patient by phone to f/u on ride to appt tomorrow 11/28. Pt has cancelled the last 5 appts with our office/hasn't been seen in a year. Was able to reach pt at [phone]. Introduced self, role, reason for call. Inquired if pt had transportation/a way to get to appt tomorrow. Pt states he does, I provided him with appt information and encouraged him if he finds himself tomorrow morning in a bind to give me a call.   Patient is participating in a Managed Medicaid Plan:  No, U[employer] Medicare only.   SDOH Screenings   Food Insecurity: No Food Insecurity (09/13/2021)  Transportation Needs: No Transportation Needs (09/13/2021)  Depression (PHQ2-9): Low Risk  (10/24/2021)  Financial Resource Strain: Low Risk  (09/13/2021)  Physical Activity: Sufficiently Active (09/13/2021)  Stress: No Stress Concern Present (09/13/2021)  Tobacco Use: Medium Risk (10/24/2021)     Westley Hummer, MSW, LCSW Clinical Social Worker [place]  [phone]- work cell phone (preferred) [phone]- desk phone

## 2022-04-01 ENCOUNTER — Ambulatory Visit: Payer: Medicare Other | Admitting: Student

## 2022-05-19 NOTE — Progress Notes (Deleted)
Cardiology Office Note:    Date:  05/19/2022   ID:  Jay Fuller, DOB December 23, 1955, MRN [id number]  PCP:  No primary care provider on file.  Cardiologist:  [name], MD  Electrophysiologist:  None   Referring MD: Carlena Hurl, PA-C   Chief Complaint: follow-up of CAD  History of Present Illness:    Jay Fuller is a 67 y.o. male with a history of CAD with NSTEMI in 2015 s/p DES to LCX and OM1, hypertension, hyperlipidemia, hepatitis C on Harvoni therapy, cirrhosis, chronic pain, and polysubstance abuse (tobacco, marijuana, and cocaine) who is followed by Dr. Ellyn Hack and presents today for routine follow-up.    Patient was admitted in 2015 with NSTEMI and underwent successful PCI with DES to LCX and OM1. Echo at that time showed LVEF of 55-60% with normal wall motion and grade 1 diastolic dysfunction. Last cath in 07/2019 showed widely patent stents and otherwise only minimal CAD.  He was last seen by me in 02/2021 at which time he reported continued exertional fatigue with no significant difference after his dose of Coreg was reduced. He also reported occasional non-exertional left sided chest pain that he described as a throbbing sensation but this was not new and was stable. No further cardiac work-up was felt to be necessary at that time.  Patient presents today for routine follow-up. ***   CAD History of NSTEMI in 2015 s/p DES to LCX and OM1. Last cath in 07/2019 showed patent stents with otherwise only minimal CAD.  - *** - Continue Plavix monotherapy.  - Continue antianginals: Coreg 12.'5mg'$  in the morning and '25mg'$  in the evening, Amlodipine '10mg'$  daily, and Imdur '30mg'$  daily. - Continue statin.   Hypertension *** - Continue current medications: Amlodipine '10mg'$  daily, Coreg 12.'5mg'$  in the morning and '25mg'$  in the evening, Imdur '30mg'$  daily, and Irbesartan-HCTZ 300-12.'5mg'$  daily.    Hyperlipidemia Lipid panel in 10/2021: Total Cholesterol 134, Triglycerides 148,  HDL 38, LDL 70.  LDL goal <70 given CAD.  - Continue Crestor '20mg'$  daily and Vascepa 2g twice daily.   Past Medical History:  Diagnosis Date   CAD 06/2013   a. NSTEMI/Cath/PCI: LM nl, dLAD 20-40%, Diags small with ~95%; pLCx 20%, dLCx 99% * (DES PCI - 2.25x16 Promus Premier DES) & pOM1 80-90% (DES PCI: 2.5x20 Promus Premier DES), OM2 min irregs, LPL1 small, min irregs, RCA/RPDA min irregs, RPL nl, EF 55%.   Chronic pain    Cirrhosis ([place])    due to Hep C   Former smoker    quit 2019   Hepatitis B immune 2016   Hepatitis C    s/p Harvoni therapy 2015   History of NSTEMI 06/2013   99% dLCx (DES PCI); 80-90% OM1 (DES PCI) --normal EF.  Normal wall motion.;    Hyperlipidemia    Hypertension    Polysubstance abuse ([place])    history of cocaine, marijuana, tobacco abuse in the use   Wears glasses     Past Surgical History:  Procedure Laterality Date   COLONOSCOPY     denied, but Cologuard negative 12/2013   CORONARY ANGIOPLASTY WITH STENT PLACEMENT  06/2013    dLCx 99% going into LPL1 (DES PCI: 2.25x16 Promus Premier DES), p OM1 80-90% (DES PCI - 2.5x20 Promus Premier DES)   LEFT HEART CATH AND CORONARY ANGIOGRAPHY N/A 07/18/2019   Procedure: LEFT HEART CATH AND CORONARY ANGIOGRAPHY;  Surgeon: Leonie Man, MD;  Location: [place] [facility];  Service: Cardiovascular;; Two-vessel CAD  with widely patent stents in OM1 and distal LCx-OM 3.  Otherwise minimal disease. No obvious culprit lesion. Normal EF and EDP.   LEFT HEART CATHETERIZATION WITH CORONARY ANGIOGRAM N/A 06/23/2013   Procedure: LEFT HEART CATHETERIZATION WITH CORONARY ANGIOGRAM;  Surgeon: Leonie Man, MD;  Location: [facility] CATH LAB:: LM nl, d LAD 20-40%, small caliber Diags w/ severel ~95% lesions, p LCX 20%, dLCx 99% going into LPL1 (PCI- DES), p OM1 80-90% (PCI -DES), OM2 min irregs, LPL1 small, min irregs, RCA/RPDA min irregs, RPL nl, EF 55%.   LIVER BIOPSY  2016   TRANSTHORACIC ECHOCARDIOGRAM  06/2013   Normal LV size and  moderate thickness/moderate LVH.  Normal EF 55-60%. No RWMA. Gr 1 DD. Mlid LA dilation    Current Medications: No outpatient medications have been marked as taking for the 06/02/22 encounter (Appointment) with Darreld Mclean, PA-C.     Allergies:   Patient has no known allergies.   Social History   Socioeconomic History   Marital status: Single    Spouse name: Not on file   Number of children: Not on file   Years of education: Not on file   Highest education level: Not on file  Occupational History   Not on file  Tobacco Use   Smoking status: Former    Packs/day: 0.50    Years: 25.00    Total pack years: 12.50    Types: Cigarettes    Quit date: 01/21/2016    Years since quitting: 6.3   Smokeless tobacco: Never  Vaping Use   Vaping Use: Never used  Substance and Sexual Activity   Alcohol use: Yes    Alcohol/week: 2.0 standard drinks of alcohol    Types: 2 Cans of beer per week   Drug use: Not Currently    Comment: cocain and marijuana in the past   Sexual activity: Yes  Other Topics Concern   Not on file  Social History Narrative   Lives with girlfriend and son   Social Determinants of Health   Financial Resource Strain: Low Risk  (09/13/2021)   Overall Financial Resource Strain (CARDIA)    Difficulty of Paying Living Expenses: Not hard at all  Food Insecurity: No Food Insecurity (09/13/2021)   Hunger Vital Sign    Worried About Running Out of Food in the Last Year: Never true    Ran Out of Food in the Last Year: Never true  Transportation Needs: No Transportation Needs (09/13/2021)   PRAPARE - Hydrologist (Medical): No    Lack of Transportation (Non-Medical): No  Physical Activity: Sufficiently Active (09/13/2021)   Exercise Vital Sign    Days of Exercise per Week: 4 days    Minutes of Exercise per Session: 60 min  Stress: No Stress Concern Present (09/13/2021)   Deport    Feeling of Stress : Not at all  Social Connections: Not on file     Family History: The patient's family history includes Cancer in his sister; Heart disease in his father and sister; Hypertension in his father, mother, sister, sister, and sister; Stroke in his sister. There is no history of Diabetes, Colon cancer, Stomach cancer, or Pancreatic cancer.  ROS:   Please see the history of present illness.     EKGs/Labs/Other Studies Reviewed:    The following studies were reviewed:  Echocardiogram 06/24/2013: Study Conclusions: - Left ventricle: The cavity size was normal. Wall thickness  was increased in a pattern of moderate LVH. Systolic    function was normal. The estimated ejection fraction was    in the range of 55% to 60%. Wall motion was normal; there    were no regional wall motion abnormalities. Doppler    parameters are consistent with abnormal left ventricular    relaxation (grade 1 diastolic dysfunction).  - Left atrium: The atrium was mildly dilated.  _______________   Left Cardiac Catheterization 07/18/2019: Previously placed 1st Mrg stent (Promus Premier DES 2.5 mm x 20 mm - 2.75 mm) is widely patent. Previously placed dLCx/3rd Mrg-1 Stent (Promus Premier DES 2.25 mm x 42m - 2.5 mm) is widely patent. DLCx-3rd Mrg-2 lesion is 30% stenosed. The left ventricular systolic function is The left ventricular ejection fraction is 55-65% by visual estimate. LV end diastolic pressure is normal.   Summary: Two-vessel CAD with widely patent stents in OM1 and distal LCx-OM 3.  Otherwise minimal disease. No obvious culprit lesion. Normal EF and EDP.   Recommendations: Okay to discharge home after bedrest Consider nonanginal etiology for chest pain-less likely microvascular disease. Follow-up as scheduled.   Diagnostic Dominance: Right     EKG:  EKG ordered today. EKG personally reviewed and demonstrates ***.  Recent Labs: 10/24/2021: ALT 11; BUN 12;  Creatinine, Ser 0.92; Hemoglobin 13.3; Platelets 203; Potassium 3.8; Sodium 140  Recent Lipid Panel    Component Value Date/Time   CHOL 134 10/24/2021 1024   TRIG 148 10/24/2021 1024   HDL 38 (L) 10/24/2021 1024   CHOLHDL 3.5 10/24/2021 1024   CHOLHDL 3.1 01/20/2017 0933   VLDL 48 (H) 07/25/2015 0001   LDLCALC 70 10/24/2021 1024   LDLCALC 64 01/20/2017 0933    Physical Exam:    Vital Signs: There were no vitals taken for this visit.    Wt Readings from Last 3 Encounters:  10/24/21 196 lb (88.9 kg)  09/13/21 190 lb (86.2 kg)  02/28/21 193 lb 6.4 oz (87.7 kg)     General: 67y.o. male in no acute distress. HEENT: Normocephalic and atraumatic. Sclera clear. EOMs intact. Neck: Supple. No carotid bruits. No JVD. Heart: *** RRR. Distinct S1 and S2. No murmurs, gallops, or rubs. Radial and distal pedal pulses 2+ and equal bilaterally. Lungs: No increased work of breathing. Clear to ausculation bilaterally. No wheezes, rhonchi, or rales.  Abdomen: Soft, non-distended, and non-tender to palpation. Bowel sounds present in all 4 quadrants.  MSK: Normal strength and tone for age. *** Extremities: No lower extremity edema.    Skin: Warm and dry. Neuro: Alert and oriented x3. No focal deficits. Psych: Normal affect. Responds appropriately.   Assessment:    No diagnosis found.  Plan:     Disposition: Follow up in ***   Medication Adjustments/Labs and Tests Ordered: Current medicines are reviewed at length with the patient today.  Concerns regarding medicines are outlined above.  No orders of the defined types were placed in this encounter.  No orders of the defined types were placed in this encounter.   There are no Patient Instructions on file for this visit.   Signed, CDarreld Mclean PA-C  05/19/2022 6:49 PM    [facility]

## 2022-06-02 ENCOUNTER — Ambulatory Visit: Payer: 59 | Attending: Student | Admitting: Student

## 2022-06-05 NOTE — Progress Notes (Deleted)
Primary Care Provider: No primary care provider on file. [place] Cardiologist: [name], MD Electrophysiologist: None  Clinic Note: No chief complaint on file.   ===================================  ASSESSMENT/PLAN   Problem List Items Addressed This Visit       Cardiology Problems   CAD S/P percutaneous coronary angioplasty - Primary (Chronic)   Hyperlipidemia associated with type 2 diabetes mellitus (HCC) (Chronic)     Other   History of non-ST elevation myocardial infarction (NSTEMI) (Chronic)   ===================================  HPI:    Jay Fuller is a 67 y.o. male with a PMH notable for CAD (non-STEMI in 2015, DES PCI LCx, OM1), HTN, HLD, new diagnosis DM-2, Liver Cirrhosis (Hep C-treated with Harvoni), and chronic pain as well as PSA (tobacco, marijuana and cocaine) who presents today for delayed annual follow-up  NSTEMI 06/2013: 99% dLCx (DES PCI), 80-90% OM1 (DES PCI). Normal LVEF & no RWMA. CATH 07/18/2019: Widely patent STENTS. Otw Minimal CAD.  CRFs: HTN, HLD & new Dx DM-2 H/o PSA: Cocaine, MJ, tobacco Hep C (Rx'd with Harvoni) - Cirrhosis.   Jay Fuller was last seen on 02/28/2021 by Sande Rives, PA as a 67-monthfollow-up.  I had continued monotherapy with Plavix, stop aspirin and reduced his morning dose of carvedilol to 12.5 mg in July 2022.  Did not notice any change in exercise fatigue with reduced dose of carvedilol.  This is usually intermittent but not every time.  Occasional left-sided chest pain, throbbing.  1-to/10.  Can last up to 20 minutes but usually less than 5 minutes.  No PRN NTG.  [employer] treadmill 3 times a week 15 to 20 minutes and track 1/2 mile followed by additional bike for 15 minutes. Recommended 2-week BP log.  If consistent BP greater than 130/80 mmHg, plan was to increase carvedilol back to 25 mg twice daily. LDL was 81 on 20 mg Lipitor and Vascepa 2 g twice daily. => Labs ordered.  Recent  Hospitalizations: ***  Reviewed  CV studies:    The following studies were reviewed today: (if available, images/films reviewed: From Epic Chart or Care Everywhere) ***:  Interval History:   Jay Fuller  CV Review of Symptoms (Summary): Cardiovascular ROS: positive for - chest pain negative for - dyspnea on exertion, edema, irregular heartbeat, murmur, orthopnea, palpitations, paroxysmal nocturnal dyspnea, rapid heart rate, shortness of breath, or syncope or near syncope, TIA/amaurosis fugax, claudication  REVIEWED OF SYSTEMS   Review of Systems  Constitutional:  Negative for fever, malaise/fatigue and weight loss.  HENT:  Negative for congestion.   Respiratory:  Negative for cough and shortness of breath.   Gastrointestinal:  Negative for blood in stool.  Genitourinary:  Negative for flank pain, frequency and hematuria.  Musculoskeletal:  Positive for joint pain. Negative for falls and myalgias.  Neurological:  Negative for dizziness, focal weakness and weakness.  Psychiatric/Behavioral: Negative.    All other systems reviewed and are negative.  Mild aches and pains  I have reviewed and (if needed) personally updated the patient's problem list, medications, allergies, past medical and surgical history, social and family history.   PAST MEDICAL HISTORY   Past Medical History:  Diagnosis Date   CAD 06/2013   a. NSTEMI/Cath/PCI: LM nl, dLAD 20-40%, Diags small with ~95%; pLCx 20%, dLCx 99% * (DES PCI - 2.25x16 Promus Premier DES) & pOM1 80-90% (DES PCI: 2.5x20 Promus Premier DES), OM2 min irregs, LPL1 small, min irregs, RCA/RPDA min irregs, RPL nl, EF 55%.   Chronic pain  Cirrhosis ([place])    due to Hep C   Former smoker    quit 2019   Hepatitis B immune 2016   Hepatitis C    s/p Harvoni therapy 2015   History of NSTEMI 06/2013   99% dLCx (DES PCI); 80-90% OM1 (DES PCI) --normal EF.  Normal wall motion.;    Hyperlipidemia    Hypertension    Polysubstance abuse  ([place])    history of cocaine, marijuana, tobacco abuse in the use   Wears glasses     PAST SURGICAL HISTORY   Past Surgical History:  Procedure Laterality Date   COLONOSCOPY     denied, but Cologuard negative 12/2013   CORONARY ANGIOPLASTY WITH STENT PLACEMENT  06/2013    dLCx 99% going into LPL1 (DES PCI: 2.25x16 Promus Premier DES), p OM1 80-90% (DES PCI - 2.5x20 Promus Premier DES)   LEFT HEART CATH AND CORONARY ANGIOGRAPHY N/A 07/18/2019   Procedure: LEFT HEART CATH AND CORONARY ANGIOGRAPHY;  Surgeon: Leonie Man, MD;  Location: [place] [facility];  Service: Cardiovascular;; Two-vessel CAD with widely patent stents in OM1 and distal LCx-OM 3.  Otherwise minimal disease. No obvious culprit lesion. Normal EF and EDP.   LEFT HEART CATHETERIZATION WITH CORONARY ANGIOGRAM N/A 06/23/2013   Procedure: LEFT HEART CATHETERIZATION WITH CORONARY ANGIOGRAM;  Surgeon: Leonie Man, MD;  Location: [facility] CATH LAB:: LM nl, d LAD 20-40%, small caliber Diags w/ severel ~95% lesions, p LCX 20%, dLCx 99% going into LPL1 (PCI- DES), p OM1 80-90% (PCI -DES), OM2 min irregs, LPL1 small, min irregs, RCA/RPDA min irregs, RPL nl, EF 55%.   LIVER BIOPSY  2016   TRANSTHORACIC ECHOCARDIOGRAM  06/2013   Normal LV size and moderate thickness/moderate LVH.  Normal EF 55-60%. No RWMA. Gr 1 DD. Mlid LA dilation   CATH 07/18/2019: Patent Stents. Normal EF & EDP.    Immunization History  Administered Date(s) Administered   Hepatitis A, Adult 07/22/2013, 07/25/2015   Hepatitis B 02/06/1998, 03/12/1998, 07/19/1998   Influenza,inj,Quad PF,6+ Mos 06/24/2013, 01/22/2015, 01/20/2017, 02/24/2018   PFIZER(Purple Top)SARS-COV-2 Vaccination 07/19/2019, 08/09/2019   PNEUMOCOCCAL CONJUGATE-20 10/24/2021   Pneumococcal Conjugate-13 08/13/2020   Pneumococcal Polysaccharide-23 06/24/2013   Tdap 06/14/2012    MEDICATIONS/ALLERGIES   No outpatient medications have been marked as taking for the 06/06/22 encounter (Appointment)  with Leonie Man, MD.    No Known Allergies  SOCIAL HISTORY/FAMILY HISTORY   Reviewed in Epic:  Pertinent findings:  Social History   Tobacco Use   Smoking status: Former    Packs/day: 0.50    Years: 25.00    Total pack years: 12.50    Types: Cigarettes    Quit date: 01/21/2016    Years since quitting: 6.3   Smokeless tobacco: Never  Vaping Use   Vaping Use: Never used  Substance Use Topics   Alcohol use: Yes    Alcohol/week: 2.0 standard drinks of alcohol    Types: 2 Cans of beer per week   Drug use: Not Currently    Comment: cocain and marijuana in the past   Social History   Social History Narrative   Lives with girlfriend and son    OBJCTIVE -PE, EKG, labs   Wt Readings from Last 3 Encounters:  10/24/21 196 lb (88.9 kg)  09/13/21 190 lb (86.2 kg)  02/28/21 193 lb 6.4 oz (87.7 kg)    Physical Exam: There were no vitals taken for this visit. Physical Exam Vitals reviewed.  Constitutional:  General: He is not in acute distress.    Appearance: Normal appearance. He is not ill-appearing or toxic-appearing.     Comments: Well-groomed.  Borderline obese.  HENT:     Head: Normocephalic and atraumatic.  Neck:     Vascular: No carotid bruit.  Cardiovascular:     Rate and Rhythm: Normal rate and regular rhythm.     Pulses: Normal pulses.     Heart sounds: Normal heart sounds. No murmur heard.    No friction rub. No gallop.  Pulmonary:     Effort: Pulmonary effort is normal. No respiratory distress.     Breath sounds: Normal breath sounds. No wheezing, rhonchi or rales.  Musculoskeletal:        General: No swelling. Normal range of motion.     Cervical back: Normal range of motion and neck supple.  Skin:    General: Skin is warm and dry.  Neurological:     General: No focal deficit present.     Mental Status: He is alert and oriented to person, place, and time.     Gait: Gait normal.  Psychiatric:        Mood and Affect: Mood normal.         Behavior: Behavior normal.        Thought Content: Thought content normal.        Judgment: Judgment normal.      Adult ECG Report  Rate: *** ;  Rhythm: {rhythm:17366};   Narrative Interpretation: ***  Recent Labs: Reviewed Lab Results  Component Value Date   CHOL 134 10/24/2021   HDL 38 (L) 10/24/2021   LDLCALC 70 10/24/2021   TRIG 148 10/24/2021   CHOLHDL 3.5 10/24/2021   Lab Results  Component Value Date   CREATININE 0.92 10/24/2021   BUN 12 10/24/2021   NA 140 10/24/2021   K 3.8 10/24/2021   CL 104 10/24/2021   CO2 20 10/24/2021      Latest Ref Rng & Units 10/24/2021   10:24 AM 08/13/2020   10:28 AM 08/11/2019   10:06 AM  CBC  WBC 3.4 - 10.8 x10E3/uL 4.9  7.0  6.3   Hemoglobin 13.0 - 17.7 g/dL 13.3  14.7  13.7   Hematocrit 37.5 - 51.0 % 39.9  42.9  40.9   Platelets 150 - 450 x10E3/uL 203  226  193     Lab Results  Component Value Date   HGBA1C 6.7 (H) 10/24/2021   Lab Results  Component Value Date   TSH 1.840 06/23/2013    ================================================== I spent a total of ***minutes with the patient spent in direct patient consultation.  Additional time spent with chart review  / charting (studies, outside notes, etc): *** min (8 min) Total Time: *** min  Current medicines are reviewed at length with the patient today.  (+/- concerns) ***  Notice: This dictation was prepared with Dragon dictation along with smart phrase technology. Any transcriptional errors that result from this process are unintentional and may not be corrected upon review.  Studies Ordered:   No orders of the defined types were placed in this encounter.  No orders of the defined types were placed in this encounter.   Patient Instructions / Medication Changes & Studies & Tests Ordered   There are no Patient Instructions on file for this visit.     Leonie Man, MD, MS [name], M.D., M.S. Interventional Cardiologist  [place]   Pager # [phone] Phone # [phone]  [address] [place], [place] [address]   Thank you for choosing [facility] [place]!!

## 2022-06-06 ENCOUNTER — Ambulatory Visit: Payer: 59 | Attending: Cardiology | Admitting: Cardiology

## 2022-06-06 DIAGNOSIS — I252 Old myocardial infarction: Secondary | ICD-10-CM

## 2022-06-06 DIAGNOSIS — I2510 Atherosclerotic heart disease of native coronary artery without angina pectoris: Secondary | ICD-10-CM

## 2022-06-06 DIAGNOSIS — E1169 Type 2 diabetes mellitus with other specified complication: Secondary | ICD-10-CM

## 2022-07-08 NOTE — Progress Notes (Addendum)
Cardiology Office Note:    Date:  07/22/2022   ID:  [name], DOB [birth year], MRN [id number]  PCP:  No primary care provider on file.  Cardiologist:  [name], MD  Electrophysiologist:  None   Referring MD: No ref. provider found   Chief Complaint: follow-up of CAD  History of Present Illness:    Jay Fuller is a 67 y.o. male with a history of CAD with NSTEMI in 2015 s/p DES to LCX and OM1, hypertension, hyperlipidemia, hepatitis C on Harvoni therapy, cirrhosis, chronic pain, and polysubstance abuse (tobacco, marijuana, and cocaine) who is followed by Dr. Ellyn Hack and presents today for routine follow-up.    Patient was admitted in 2015 with NSTEMI and underwent successful PCI with DES to LCX and OM1. Echo at that time showed LVEF of 55-60% with normal wall motion and grade 1 diastolic dysfunction. Last cath in 07/2019 showed widely patent stents and otherwise only minimal CAD.  He was last seen by me in 02/2021 at which time he reported continued exertional fatigue with no significant difference after his dose of Coreg was reduced. He also reported occasional non-exertional left sided chest pain that he described as a throbbing sensation but this was not new and was stable. No further cardiac work-up was felt to be necessary at that time.  Patient presents today for routine follow-up. He has done very well since last visit. He denies any cardiac symptoms - no chest pain, shortness of breath, orthopnea, PND, edema, palpitation, lightheadedness, dizziness, syncope. He is staying active and goes to the gym about 3 times a week where he lifts weights and will run on the treadmill or walk on the track for 2 miles. He denies any chest pain with this. He states he quit smoking about 1 weeks ago and I congratulated him on this.   His BP is mildly elevated in the office today. Initially 144/78 and then 152/70 on my personal recheck at the end of the visit. However, he has not  taken any of his medications today and states his BP is usually in the 120s/70s at home.  Past Medical History:  Diagnosis Date   CAD 06/2013   a. NSTEMI/Cath/PCI: LM nl, dLAD 20-40%, Diags small with ~95%; pLCx 20%, dLCx 99% * (DES PCI - 2.25x16 Promus Premier DES) & pOM1 80-90% (DES PCI: 2.5x20 Promus Premier DES), OM2 min irregs, LPL1 small, min irregs, RCA/RPDA min irregs, RPL nl, EF 55%.   Chronic pain    Cirrhosis ([place])    due to Hep C   Former smoker    quit 2019   Hepatitis B immune 2016   Hepatitis C    s/p Harvoni therapy 2015   History of NSTEMI 06/2013   99% dLCx (DES PCI); 80-90% OM1 (DES PCI) --normal EF.  Normal wall motion.;    Hyperlipidemia    Hypertension    Polysubstance abuse ([place])    history of cocaine, marijuana, tobacco abuse in the use   Wears glasses     Past Surgical History:  Procedure Laterality Date   COLONOSCOPY     denied, but Cologuard negative 12/2013   CORONARY ANGIOPLASTY WITH STENT PLACEMENT  06/2013    dLCx 99% going into LPL1 (DES PCI: 2.25x16 Promus Premier DES), p OM1 80-90% (DES PCI - 2.5x20 Promus Premier DES)   LEFT HEART CATH AND CORONARY ANGIOGRAPHY N/A 07/18/2019   Procedure: LEFT HEART CATH AND CORONARY ANGIOGRAPHY;  Surgeon: Leonie Man, MD;  Location: [facility];  Service: Cardiovascular;; Two-vessel CAD with widely patent stents in OM1 and distal LCx-OM 3.  Otherwise minimal disease. No obvious culprit lesion. Normal EF and EDP.   LEFT HEART CATHETERIZATION WITH CORONARY ANGIOGRAM N/A 06/23/2013   Procedure: LEFT HEART CATHETERIZATION WITH CORONARY ANGIOGRAM;  Surgeon: Leonie Man, MD;  Location: [facility] CATH LAB:: LM nl, d LAD 20-40%, small caliber Diags w/ severel ~95% lesions, p LCX 20%, dLCx 99% going into LPL1 (PCI- DES), p OM1 80-90% (PCI -DES), OM2 min irregs, LPL1 small, min irregs, RCA/RPDA min irregs, RPL nl, EF 55%.   LIVER BIOPSY  2016   TRANSTHORACIC ECHOCARDIOGRAM  06/2013   Normal LV size and moderate  thickness/moderate LVH.  Normal EF 55-60%. No RWMA. Gr 1 DD. Mlid LA dilation    Current Medications: Current Meds  Medication Sig   amLODipine (NORVASC) 10 MG tablet TAKE 1 TABLET BY MOUTH DAILY   blood glucose meter kit and supplies KIT Inject 1 each into the skin daily. Dispense based on patient and insurance preference. Use up to four times daily as directed.   Budeson-Glycopyrrol-Formoterol (BREZTRI AEROSPHERE) 160-9-4.8 MCG/ACT AERO Inhale 2 puffs into the lungs 2 (two) times daily.   carvedilol (COREG) 25 MG tablet TAKE 1/2 TABLET BY MOUTH IN THE MORNING AND 1 TABLET IN THE EVENING   clopidogrel (PLAVIX) 75 MG tablet TAKE 1 TABLET BY MOUTH DAILY   diphenhydrAMINE (SOMINEX) 25 MG tablet Take 25 mg by mouth at bedtime.   empagliflozin (JARDIANCE) 10 MG TABS tablet Take 1 tablet (10 mg total) by mouth daily before breakfast.   irbesartan-hydrochlorothiazide (AVALIDE) 300-12.5 MG tablet TAKE 1 TABLET BY MOUTH DAILY   isosorbide mononitrate (IMDUR) 30 MG 24 hr tablet TAKE 1 TABLET BY MOUTH DAILY   nitroGLYCERIN (NITROSTAT) 0.4 MG SL tablet PLACE 1 TABLET UNDER THE TONGUE EVERY 5 MINURES FOR 3 DOSES AS NEEDED FOR CHEST PAIN   rosuvastatin (CRESTOR) 20 MG tablet Take 1 tablet (20 mg total) by mouth daily.   VASCEPA 1 g capsule TAKE 2 CAPSULES BY MOUTH TWICE DAILY     Allergies:   Patient has no known allergies.   Social History   Socioeconomic History   Marital status: Single    Spouse name: Not on file   Number of children: Not on file   Years of education: Not on file   Highest education level: Not on file  Occupational History   Not on file  Tobacco Use   Smoking status: Former    Packs/day: 0.50    Years: 25.00    Additional pack years: 0.00    Total pack years: 12.50    Types: Cigarettes    Quit date: 01/21/2016    Years since quitting: 6.5   Smokeless tobacco: Never  Vaping Use   Vaping Use: Never used  Substance and Sexual Activity   Alcohol use: Yes     Alcohol/week: 2.0 standard drinks of alcohol    Types: 2 Cans of beer per week   Drug use: Not Currently    Comment: cocain and marijuana in the past   Sexual activity: Yes  Other Topics Concern   Not on file  Social History Narrative   Lives with girlfriend and son   Social Determinants of Health   Financial Resource Strain: Low Risk  (09/13/2021)   Overall Financial Resource Strain (CARDIA)    Difficulty of Paying Living Expenses: Not hard at all  Food Insecurity: No Food Insecurity (09/13/2021)   Hunger  Vital Sign    Worried About Charity fundraiser in the Last Year: Never true    Ran Out of Food in the Last Year: Never true  Transportation Needs: No Transportation Needs (09/13/2021)   PRAPARE - Hydrologist (Medical): No    Lack of Transportation (Non-Medical): No  Physical Activity: Sufficiently Active (09/13/2021)   Exercise Vital Sign    Days of Exercise per Week: 4 days    Minutes of Exercise per Session: 60 min  Stress: No Stress Concern Present (09/13/2021)   [place]    Feeling of Stress : Not at all  Social Connections: Not on file     Family History: The patient's family history includes Cancer in his sister; Heart disease in his father and sister; Hypertension in his father, mother, sister, sister, and sister; Stroke in his sister. There is no history of Diabetes, Colon cancer, Stomach cancer, or Pancreatic cancer.  ROS:   Please see the history of present illness.     EKGs/Labs/Other Studies Reviewed:    The following studies were reviewed:  Echocardiogram 06/24/2013: Study Conclusions: - Left ventricle: The cavity size was normal. Wall thickness    was increased in a pattern of moderate LVH. Systolic    function was normal. The estimated ejection fraction was    in the range of 55% to 60%. Wall motion was normal; there    were no regional wall motion  abnormalities. Doppler    parameters are consistent with abnormal left ventricular    relaxation (grade 1 diastolic dysfunction).  - Left atrium: The atrium was mildly dilated.  _______________   Left Cardiac Catheterization 07/18/2019: Previously placed 1st Mrg stent (Promus Premier DES 2.5 mm x 20 mm - 2.75 mm) is widely patent. Previously placed dLCx/3rd Mrg-1 Stent (Promus Premier DES 2.25 mm x 68mm - 2.5 mm) is widely patent. DLCx-3rd Mrg-2 lesion is 30% stenosed. The left ventricular systolic function is The left ventricular ejection fraction is 55-65% by visual estimate. LV end diastolic pressure is normal.   Summary: Two-vessel CAD with widely patent stents in OM1 and distal LCx-OM 3.  Otherwise minimal disease. No obvious culprit lesion. Normal EF and EDP.   Recommendations: Okay to discharge home after bedrest Consider nonanginal etiology for chest pain-less likely microvascular disease. Follow-up as scheduled.   Diagnostic Dominance: Right   EKG:  EKG ordered today. EKG personally reviewed and demonstrates normal sinus hrythm, rate 62 bpm, with Q waves in lead III but  acute ST/T changes.   Recent Labs: 10/24/2021: ALT 11; BUN 12; Creatinine, Ser 0.92; Hemoglobin 13.3; Platelets 203; Potassium 3.8; Sodium 140  Recent Lipid Panel    Component Value Date/Time   CHOL 134 10/24/2021 1024   TRIG 148 10/24/2021 1024   HDL 38 (L) 10/24/2021 1024   CHOLHDL 3.5 10/24/2021 1024   CHOLHDL 3.1 01/20/2017 0933   VLDL 48 (H) 07/25/2015 0001   LDLCALC 70 10/24/2021 1024   LDLCALC 64 01/20/2017 0933    Physical Exam:    Vital Signs: BP (!) 152/70   Pulse 62   Ht 5\' 7"  (1.702 m)   Wt 194 lb 3.2 oz (88.1 kg)   SpO2 98%   BMI 30.42 kg/m     Wt Readings from Last 3 Encounters:  07/22/22 194 lb 3.2 oz (88.1 kg)  10/24/21 196 lb (88.9 kg)  09/13/21 190 lb (86.2 kg)     General:  67 y.o. African-American male in no acute distress. HEENT: Normocephalic and atraumatic.  Sclera clear. EOMs intact. Neck: Supple. No carotid bruits. No JVD. Heart: RRR. Distinct S1 and S2. No murmurs, gallops, or rubs. Radial pulses 2+ and equal bilaterally. Lungs: No increased work of breathing. Clear to ausculation bilaterally. No wheezes, rhonchi, or rales.  Abdomen: Soft, non-distended, and non-tender to palpation.  Extremities: No lower extremity edema.    Skin: Warm and dry. Neuro: Alert and oriented x3. No focal deficits. Psych: Normal affect. Responds appropriately.   Assessment:    1. Coronary artery disease involving native coronary artery of native heart without angina pectoris   2. Primary hypertension   3. Hyperlipidemia, unspecified hyperlipidemia type   4. History of polysubstance abuse     Plan:    CAD History of NSTEMI in 2015 s/p DES to LCX and OM1. Last cath in 07/2019 showed patent stents with otherwise only minimal CAD.  - Doing very well. No anginal symptoms. - Continue antianginals: Coreg 12.5mg  in the morning and 25mg  in the evening, Amlodipine 10mg  daily, and Imdur 30mg  daily. - Continue Plavix monotherapy.  - Continue high-intensity statin.   Hypertension BP mildly elevated in the office.  Initially 144/78 and then 152/70 on my personal recheck at the end of the visit. However, he has not taken any of his medications today and states his BP is usually in the 120s/70s at home. - Continue current medications: Amlodipine 10mg  daily, Coreg 12.5mg  in the morning and 25mg  in the evening, Imdur 30mg  daily, and Irbesartan-HCTZ 300-12.5mg  daily. Advised him to take his medications when he gets home.   Hyperlipidemia Lipid panel in 10/2021: Total Cholesterol 134, Triglycerides 148, HDL 38, LDL 70.  LDL goal <70 given CAD.  - Continue Crestor 20mg  daily and Vascepa 2g twice daily.   History of Polysubstance Abuse History of tobacco, marijuana, and cocaine use.  - He states he quit smoking cigarettes about 1 week ago. Congratulated him on this and  reemphasized the importance of complete cessation.  - He denies any marijuana or cocaine use.   Disposition: Follow up in 1 year.    Medication Adjustments/Labs and Tests Ordered: Current medicines are reviewed at length with the patient today.  Concerns regarding medicines are outlined above.  Orders Placed This Encounter  Procedures   EKG 12-Lead   No orders of the defined types were placed in this encounter.   Patient Instructions  Medication Instructions:   Your physician recommends that you continue on your current medications as directed. Please refer to the Current Medication list given to you today.  *If you need a refill on your cardiac medications before your next appointment, please call your pharmacy*  Lab Work: NONE ordered at this time of appointment   If you have labs (blood work) drawn today and your tests are completely normal, you will receive your results only by: [place] (if you have MyChart) OR A paper copy in the mail If you have any lab test that is abnormal or we need to change your treatment, we will call you to review the results.  Testing/Procedures: NONE ordered at this time of appointment   Follow-Up: At [facility], you and your health needs are our priority.  As part of our continuing mission to provide you with exceptional heart care, we have created designated Provider Care Teams.  These Care Teams include your primary Cardiologist (physician) and Advanced Practice Providers (APPs -  Physician Assistants and Nurse Practitioners)  who all work together to provide you with the care you need, when you need it.  We recommend signing up for the patient portal called "MyChart".  Sign up information is provided on this After Visit Summary.  MyChart is used to connect with patients for Virtual Visits (Telemedicine).  Patients are able to view lab/test results, encounter notes, upcoming appointments, etc.  Non-urgent messages can be sent to  your provider as well.   To learn more about what you can do with MyChart, go to [email or web address].    Your next appointment:   1 year(s)  Provider:   [name], MD  or Sande Rives, PA-C        Other Instructions     Signed, Darreld Mclean, PA-C  07/22/2022 12:23 PM    [place]

## 2022-07-22 ENCOUNTER — Encounter: Payer: Self-pay | Admitting: Student

## 2022-07-22 ENCOUNTER — Ambulatory Visit: Payer: 59 | Attending: Student | Admitting: Student

## 2022-07-22 VITALS — BP 152/70 | HR 62 | Ht 67.0 in | Wt 194.2 lb

## 2022-07-22 DIAGNOSIS — I10 Essential (primary) hypertension: Secondary | ICD-10-CM

## 2022-07-22 DIAGNOSIS — F1910 Other psychoactive substance abuse, uncomplicated: Secondary | ICD-10-CM | POA: Diagnosis not present

## 2022-07-22 DIAGNOSIS — I2510 Atherosclerotic heart disease of native coronary artery without angina pectoris: Secondary | ICD-10-CM | POA: Diagnosis not present

## 2022-07-22 DIAGNOSIS — E785 Hyperlipidemia, unspecified: Secondary | ICD-10-CM | POA: Diagnosis not present

## 2022-07-22 NOTE — Patient Instructions (Signed)
Medication Instructions:   Your physician recommends that you continue on your current medications as directed. Please refer to the Current Medication list given to you today.  *If you need a refill on your cardiac medications before your next appointment, please call your pharmacy*  Lab Work: NONE ordered at this time of appointment   If you have labs (blood work) drawn today and your tests are completely normal, you will receive your results only by: [place] (if you have MyChart) OR A paper copy in the mail If you have any lab test that is abnormal or we need to change your treatment, we will call you to review the results.  Testing/Procedures: NONE ordered at this time of appointment   Follow-Up: At [facility], you and your health needs are our priority.  As part of our continuing mission to provide you with exceptional heart care, we have created designated Provider Care Teams.  These Care Teams include your primary Cardiologist (physician) and Advanced Practice Providers (APPs -  Physician Assistants and Nurse Practitioners) who all work together to provide you with the care you need, when you need it.  We recommend signing up for the patient portal called "MyChart".  Sign up information is provided on this After Visit Summary.  MyChart is used to connect with patients for Virtual Visits (Telemedicine).  Patients are able to view lab/test results, encounter notes, upcoming appointments, etc.  Non-urgent messages can be sent to your provider as well.   To learn more about what you can do with MyChart, go to [email or web address].    Your next appointment:   1 year(s)  Provider:   [name], MD  or Sande Rives, PA-C        Other Instructions

## 2022-09-12 ENCOUNTER — Other Ambulatory Visit: Payer: Self-pay

## 2022-09-12 ENCOUNTER — Emergency Department (HOSPITAL_COMMUNITY)
Admission: EM | Admit: 2022-09-12 | Discharge: 2022-09-12 | Disposition: A | Discharge status: Home / Self Care | Payer: 59 | Attending: Emergency Medicine | Admitting: Emergency Medicine

## 2022-09-12 ENCOUNTER — Encounter (HOSPITAL_COMMUNITY): Payer: Self-pay | Admitting: Emergency Medicine

## 2022-09-12 DIAGNOSIS — T63444A Toxic effect of venom of bees, undetermined, initial encounter: Secondary | ICD-10-CM | POA: Diagnosis not present

## 2022-09-12 DIAGNOSIS — T782XXA Anaphylactic shock, unspecified, initial encounter: Secondary | ICD-10-CM | POA: Insufficient documentation

## 2022-09-12 DIAGNOSIS — R21 Rash and other nonspecific skin eruption: Secondary | ICD-10-CM | POA: Diagnosis present

## 2022-09-12 DIAGNOSIS — I2510 Atherosclerotic heart disease of native coronary artery without angina pectoris: Secondary | ICD-10-CM | POA: Diagnosis not present

## 2022-09-12 DIAGNOSIS — Z7902 Long term (current) use of antithrombotics/antiplatelets: Secondary | ICD-10-CM | POA: Diagnosis not present

## 2022-09-12 DIAGNOSIS — E119 Type 2 diabetes mellitus without complications: Secondary | ICD-10-CM | POA: Diagnosis not present

## 2022-09-12 DIAGNOSIS — I10 Essential (primary) hypertension: Secondary | ICD-10-CM | POA: Diagnosis not present

## 2022-09-12 MED ORDER — METHYLPREDNISOLONE SODIUM SUCC 125 MG IJ SOLR
125.0000 mg | Freq: Once | INTRAMUSCULAR | Status: AC
  Administered 2022-09-12: 125 mg via INTRAVENOUS
  Filled 2022-09-12: qty 2

## 2022-09-12 MED ORDER — PREDNISONE 20 MG PO TABS: 40.0000 mg | ORAL_TABLET | Freq: Every day | ORAL | 0 refills | Status: AC

## 2022-09-12 MED ORDER — EPINEPHRINE 0.3 MG/0.3ML IJ SOAJ: 0.3000 mg | INTRAMUSCULAR | 0 refills | Status: AC | PRN

## 2022-09-12 NOTE — ED Triage Notes (Signed)
Pt in via [facility] with allergic rxn after bee sting 1hr ago, known bee allergy. Pt has localized hives to L arm, hives to neck present. Denies any sob or throat swelling/trouble swallowing, no hoarseness. Sats 98% on RA. Pt received 0.3mg  Epi and 50mg  Benadryl en route via 20G LAC at 1935. EMS notes hives markedly improved after meds

## 2022-09-12 NOTE — Discharge Instructions (Signed)
Your history and exam today are consistent with anaphylactic reaction to the bee sting on your arm today.  As you received epinephrine and route, we watched you for several hours.  Your symptoms all improved and we gave you steroids.  Please use over-the-counter antihistamines for any further rash and itching and take the steroids for the next 5 days.  Please fill a prescription for EpiPen as well and be careful around bees.  Please follow-up with your primary doctor.  If any symptoms change or worsen acutely, please return to the nearest emergency department.

## 2022-09-12 NOTE — ED Provider Notes (Signed)
[place] AT [facility] Provider Note   CSN: [id number] Arrival date & time: 09/12/22  2000     History  Chief Complaint  Patient presents with   Allergic Reaction    Gerardus Brink is a 67 y.o. male.  The history is provided by the patient and medical records. No language interpreter was used.  Allergic Reaction Presenting symptoms: itching, rash and swelling   Presenting symptoms: no wheezing   Severity:  Moderate Prior allergic episodes:  No prior episodes Context: insect bite/sting   Relieved by:  Antihistamines, steroids and epinephrine Worsened by:  Nothing Ineffective treatments:  None tried      Home Medications Prior to Admission medications   Medication Sig Start Date End Date Taking? Authorizing Provider  amLODipine (NORVASC) 10 MG tablet TAKE 1 TABLET BY MOUTH DAILY 02/19/22   Tysinger, Kermit Balo, PA-C  blood glucose meter kit and supplies KIT Inject 1 each into the skin daily. Dispense based on patient and insurance preference. Use up to four times daily as directed. 10/25/21   Tysinger, Kermit Balo, PA-C  Budeson-Glycopyrrol-Formoterol (BREZTRI AEROSPHERE) 160-9-4.8 MCG/ACT AERO Inhale 2 puffs into the lungs 2 (two) times daily. 10/25/21   Tysinger, Kermit Balo, PA-C  carvedilol (COREG) 25 MG tablet TAKE 1/2 TABLET BY MOUTH IN THE MORNING AND 1 TABLET IN THE EVENING 02/19/22   Tysinger, Kermit Balo, PA-C  clopidogrel (PLAVIX) 75 MG tablet TAKE 1 TABLET BY MOUTH DAILY 02/19/22   Tysinger, Kermit Balo, PA-C  diphenhydrAMINE (SOMINEX) 25 MG tablet Take 25 mg by mouth at bedtime.    [provider]  empagliflozin (JARDIANCE) 10 MG TABS tablet Take 1 tablet (10 mg total) by mouth daily before breakfast. 10/25/21   Tysinger, Kermit Balo, PA-C  irbesartan-hydrochlorothiazide (AVALIDE) 300-12.5 MG tablet TAKE 1 TABLET BY MOUTH DAILY 02/19/22   Tysinger, Kermit Balo, PA-C  isosorbide mononitrate (IMDUR) 30 MG 24 hr tablet TAKE 1 TABLET BY MOUTH DAILY  02/19/22   Tysinger, Kermit Balo, PA-C  nitroGLYCERIN (NITROSTAT) 0.4 MG SL tablet PLACE 1 TABLET UNDER THE TONGUE EVERY 5 MINURES FOR 3 DOSES AS NEEDED FOR CHEST PAIN 02/25/18   Tysinger, Kermit Balo, PA-C  rosuvastatin (CRESTOR) 20 MG tablet Take 1 tablet (20 mg total) by mouth daily. 09/18/21   Marjie Skiff E, PA-C  VASCEPA 1 g capsule TAKE 2 CAPSULES BY MOUTH TWICE DAILY 02/19/22   Tysinger, Kermit Balo, PA-C      Allergies    Patient has no known allergies.    Review of Systems   Review of Systems  Constitutional:  Negative for chills, fatigue and fever.  HENT:  Negative for congestion.   Eyes:  Negative for visual disturbance.  Respiratory:  Negative for cough, chest tightness, shortness of breath and wheezing.   Cardiovascular:  Negative for chest pain and palpitations.  Gastrointestinal:  Negative for abdominal pain, constipation, diarrhea, nausea and vomiting.  Genitourinary:  Negative for dysuria.  Musculoskeletal:  Negative for back pain and neck pain.  Skin:  Positive for itching and rash.  Neurological:  Negative for weakness, light-headedness and headaches.  Psychiatric/Behavioral:  Negative for agitation and confusion.   All other systems reviewed and are negative.   Physical Exam Updated Vital Signs BP (!) 163/75 (BP Location: Right Arm)   Pulse 74   Temp 98.4 F (36.9 C) (Oral)   Resp 18   Wt 88.1 kg   SpO2 96%   BMI 30.42 kg/m  Physical Exam Vitals and nursing  note reviewed.  Constitutional:      General: He is not in acute distress.    Appearance: He is well-developed. He is not ill-appearing, toxic-appearing or diaphoretic.  HENT:     Head: Normocephalic and atraumatic.     Nose: No congestion or rhinorrhea.     Mouth/Throat:     Mouth: Mucous membranes are moist.     Pharynx: No oropharyngeal exudate or posterior oropharyngeal erythema.  Eyes:     Extraocular Movements: Extraocular movements intact.     Conjunctiva/sclera: Conjunctivae normal.     Pupils:  Pupils are equal, round, and reactive to light.  Cardiovascular:     Rate and Rhythm: Normal rate and regular rhythm.     Heart sounds: No murmur heard. Pulmonary:     Effort: Pulmonary effort is normal. No respiratory distress.     Breath sounds: Normal breath sounds. No wheezing, rhonchi or rales.  Chest:     Chest wall: No tenderness.  Abdominal:     General: Abdomen is flat.     Palpations: Abdomen is soft.     Tenderness: There is no abdominal tenderness. There is no right CVA tenderness, left CVA tenderness, guarding or rebound.  Musculoskeletal:        General: No swelling or tenderness.     Cervical back: Neck supple.     Right lower leg: No edema.     Left lower leg: No edema.  Skin:    General: Skin is warm and dry.     Capillary Refill: Capillary refill takes less than 2 seconds.     Findings: Erythema and rash present.  Neurological:     General: No focal deficit present.     Mental Status: He is alert.     Sensory: No sensory deficit.     Motor: No weakness.  Psychiatric:        Mood and Affect: Mood normal.     ED Results / Procedures / Treatments   Labs (all labs ordered are listed, but only abnormal results are displayed) Labs Reviewed - No data to display  EKG None  Radiology No results found.  Procedures Procedures    Medications Ordered in ED Medications  methylPREDNISolone sodium succinate (SOLU-MEDROL) 125 mg/2 mL injection 125 mg (125 mg Intravenous Given 09/12/22 2229)    ED Course/ Medical Decision Making/ A&P                             Medical Decision Making Risk Prescription drug management.    Kalil Bonfante is a 67 y.o. male with a past medical history significant for hypertension, hyperlipidemia, CAD, diabetes, cirrhosis, and allergy to bee stings who presents with anaphylaxis.  According to patient, he was stung by bee approximate 1 hour prior to arrival in his left arm.  He was out in the yard.  He reports he had onset  of swelling, diffuse rash, and started feeling something tightening in his throat.  He was given an EpiPen and Benadryl and route the EMS.  After medication symptoms are improving.  He denies difficulty swallowing and is not having shortness of breath.  He denies any vomiting or syncope.  He has never had a reaction this bad before.  Patient reports he was feeling well before his bee sting and thinks his epi was given about 7:00.  On exam, lungs clear and chest nontender.  Abdomen nontender.  No stridor.  No respiratory distress.  He has some urticarial rash diffusely but more on the left arm.  No facial swelling seen and no oropharyngeal exam abnormality.  Patient resting comfortably now.  Clinical aspect patient did have early anaphylaxis and was able to turn around with epinephrine and antihistamines with EMS.  Will give a dose of steroids and monitor the patient for approximately 4 hours.  After 4 hours, patient has continued improved symptoms and is feeling well.  He would like to go home.  Patient given a burst of steroids for the next several days and prescription for EpiPen's.  He will use antihistamines at home to help with itching and rash persistence and will follow-up with his primary doctor.  They understood return precautions and follow-up instructions and patient discharged in good condition.           Final Clinical Impression(s) / ED Diagnoses Final diagnoses:  Anaphylaxis, initial encounter  Bee sting, undetermined intent, initial encounter    Rx / DC Orders ED Discharge Orders          Ordered    predniSONE (DELTASONE) 20 MG tablet  Daily        09/12/22 2256    EPINEPHrine 0.3 mg/0.3 mL IJ SOAJ injection  As needed        09/12/22 2256            Clinical Impression: 1. Anaphylaxis, initial encounter   2. Bee sting, undetermined intent, initial encounter     Disposition: Discharge  Condition: Good  I have discussed the results, Dx and Tx plan with  the pt(& family if present). He/she/they expressed understanding and agree(s) with the plan. Discharge instructions discussed at great length. Strict return precautions discussed and pt &/or family have verbalized understanding of the instructions. No further questions at time of discharge.    Discharge Medication List as of 09/12/2022 10:58 PM     START taking these medications   Details  EPINEPHrine 0.3 mg/0.3 mL IJ SOAJ injection Inject 0.3 mg into the muscle as needed for anaphylaxis., Starting Fri 09/12/2022, Print    predniSONE (DELTASONE) 20 MG tablet Take 2 tablets (40 mg total) by mouth daily for 5 days., Starting Sat 09/13/2022, Until Thu 09/18/2022, Print        Follow Up: your PCP     [facility] Emergency Department at [facility] [address] [id number] [address]c [place] [address] [phone]       Riko Lumsden, Canary Brim, MD 09/12/22 [phone]

## 2022-09-12 NOTE — ED Notes (Signed)
Patient verbalizes understanding of discharge instructions. Opportunity for questioning and answers were provided. Armband removed by staff, pt discharged from ED. Ambulated out to lobby with wife  

## 2022-09-15 ENCOUNTER — Other Ambulatory Visit: Payer: Self-pay | Admitting: Student

## 2023-01-14 ENCOUNTER — Other Ambulatory Visit: Payer: Self-pay | Admitting: Medical

## 2023-01-14 DIAGNOSIS — E785 Hyperlipidemia, unspecified: Secondary | ICD-10-CM

## 2023-01-14 DIAGNOSIS — I10 Essential (primary) hypertension: Secondary | ICD-10-CM

## 2023-01-14 DIAGNOSIS — I2510 Atherosclerotic heart disease of native coronary artery without angina pectoris: Secondary | ICD-10-CM

## 2023-02-12 ENCOUNTER — Other Ambulatory Visit: Payer: Self-pay | Admitting: Medical

## 2023-02-12 DIAGNOSIS — I10 Essential (primary) hypertension: Secondary | ICD-10-CM

## 2023-02-24 ENCOUNTER — Other Ambulatory Visit: Payer: Self-pay | Admitting: Medical

## 2023-02-24 DIAGNOSIS — E785 Hyperlipidemia, unspecified: Secondary | ICD-10-CM

## 2023-02-24 DIAGNOSIS — I10 Essential (primary) hypertension: Secondary | ICD-10-CM

## 2023-02-24 DIAGNOSIS — I2510 Atherosclerotic heart disease of native coronary artery without angina pectoris: Secondary | ICD-10-CM

## 2023-08-12 ENCOUNTER — Other Ambulatory Visit: Payer: Self-pay | Admitting: Student

## 2023-08-21 NOTE — Telephone Encounter (Unsigned)
 Copied from CRM [id number]. Topic: Clinical - Medication Refill >> Aug 21, 2023  1:57 PM Silvana PARAS wrote: Most Recent Primary Care Visit:  Provider: BULAH ALM RAMAN  Department: [facility]  Visit Type: MEDICARE WELL VISIT 45  Date: 10/24/2021  Medication: amLODipine  (NORVASC ) 10 MG tablet, irbesartan -hydrochlorothiazide  (AVALIDE) 300-12.5 MG tablet  Has the patient contacted their pharmacy? Yes (Agent: If no, request that the patient contact the pharmacy for the refill. If patient does not wish to contact the pharmacy document the reason why and proceed with request.) (Agent: If yes, when and what did the pharmacy advise?)  Is this the correct pharmacy for this prescription? Yes If no, delete pharmacy and type the correct one.  This is the patient's preferred pharmacy:    [facility]  [address] [place], [place], [place] [address] [phone]        Has the prescription been filled recently? Yes  Is the patient out of the medication? Yes  Has the patient been seen for an appointment in the last year OR does the patient have an upcoming appointment? Yes  Can we respond through MyChart? Yes  Agent: Please be advised that Rx refills may take up to 3 business days. We ask that you follow-up with your pharmacy.

## 2023-09-10 ENCOUNTER — Other Ambulatory Visit: Payer: Self-pay | Admitting: Cardiology

## 2023-09-22 ENCOUNTER — Other Ambulatory Visit: Payer: Self-pay | Admitting: Family Medicine

## 2023-09-22 DIAGNOSIS — Z136 Encounter for screening for cardiovascular disorders: Secondary | ICD-10-CM

## 2023-09-22 DIAGNOSIS — Z122 Encounter for screening for malignant neoplasm of respiratory organs: Secondary | ICD-10-CM

## 2023-10-12 ENCOUNTER — Other Ambulatory Visit: Payer: Self-pay | Admitting: Cardiology

## 2023-11-11 ENCOUNTER — Other Ambulatory Visit: Payer: Self-pay | Admitting: Cardiology
